# Patient Record
Sex: Female | Born: 1945
Health system: Southern US, Community
[De-identification: ages and names within clinical notes are randomized; demographics above are authoritative.]

## PROBLEM LIST (undated history)

## (undated) DIAGNOSIS — K219 Gastro-esophageal reflux disease without esophagitis: Secondary | ICD-10-CM

## (undated) DIAGNOSIS — I219 Acute myocardial infarction, unspecified: Secondary | ICD-10-CM

## (undated) DIAGNOSIS — G629 Polyneuropathy, unspecified: Secondary | ICD-10-CM

## (undated) DIAGNOSIS — I509 Heart failure, unspecified: Secondary | ICD-10-CM

## (undated) DIAGNOSIS — G4730 Sleep apnea, unspecified: Secondary | ICD-10-CM

## (undated) DIAGNOSIS — E785 Hyperlipidemia, unspecified: Secondary | ICD-10-CM

## (undated) DIAGNOSIS — E119 Type 2 diabetes mellitus without complications: Secondary | ICD-10-CM

## (undated) DIAGNOSIS — M1990 Unspecified osteoarthritis, unspecified site: Secondary | ICD-10-CM

## (undated) DIAGNOSIS — J449 Chronic obstructive pulmonary disease, unspecified: Secondary | ICD-10-CM

## (undated) DIAGNOSIS — I10 Essential (primary) hypertension: Secondary | ICD-10-CM

## (undated) HISTORY — PX: APPENDECTOMY: SHX54

## (undated) HISTORY — PX: CHOLECYSTECTOMY: SHX55

## (undated) HISTORY — PX: ABDOMINAL HYSTERECTOMY: SHX81

## (undated) HISTORY — DX: Polyneuropathy, unspecified: G62.9

## (undated) HISTORY — PX: BREAST EXCISIONAL BIOPSY: SUR124

---

## 1996-12-23 HISTORY — PX: SPINE SURGERY: SHX786

## 2005-06-04 ENCOUNTER — Ambulatory Visit: Payer: Self-pay | Admitting: Obstetrics and Gynecology

## 2006-01-06 ENCOUNTER — Ambulatory Visit: Payer: Self-pay | Admitting: Gastroenterology

## 2006-04-18 ENCOUNTER — Ambulatory Visit: Payer: Self-pay | Admitting: Gastroenterology

## 2006-04-21 ENCOUNTER — Ambulatory Visit: Payer: Self-pay | Admitting: Gastroenterology

## 2006-06-27 ENCOUNTER — Ambulatory Visit: Payer: Self-pay | Admitting: Obstetrics and Gynecology

## 2007-05-14 ENCOUNTER — Ambulatory Visit: Payer: Self-pay | Admitting: Gastroenterology

## 2008-02-17 ENCOUNTER — Emergency Department: Payer: Self-pay | Admitting: Internal Medicine

## 2008-09-04 ENCOUNTER — Other Ambulatory Visit: Payer: Self-pay

## 2008-09-04 ENCOUNTER — Inpatient Hospital Stay: Payer: Self-pay | Admitting: Internal Medicine

## 2008-09-20 ENCOUNTER — Ambulatory Visit: Payer: Self-pay | Admitting: Urology

## 2009-09-18 IMAGING — CT CT CERVICAL SPINE WITHOUT CONTRAST
1 series · 12 of 14 positions shown, 15 images · non-contrast
Comparison: none

REASON FOR EXAM: Fall
COMMENTS:

[Series 6: axial · axial · 0.31mm/px · z∈[+333,+487]mm · 12 of 93 slices shown, 15 images]
[im 8/93  soft-tissue]
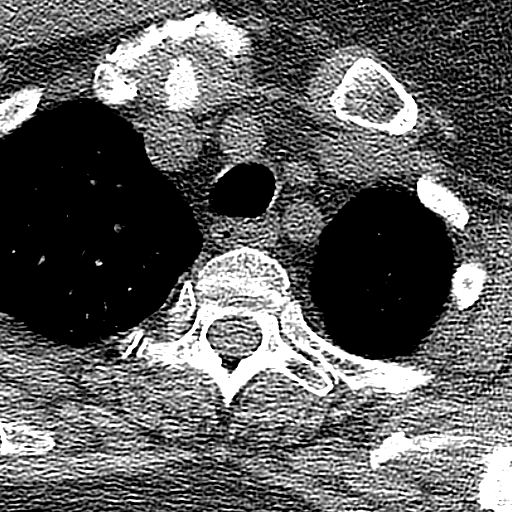
[im 8/93  bone]
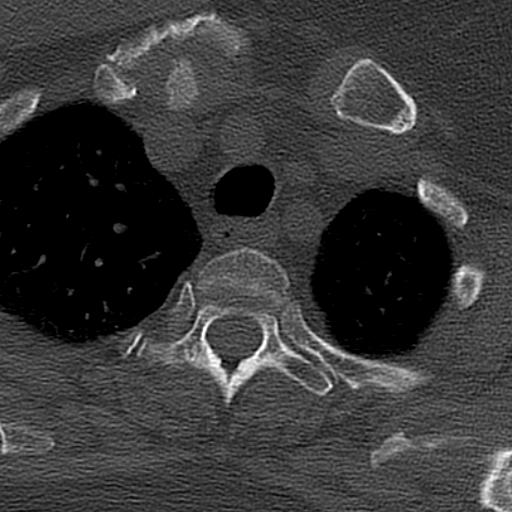
[im 15/93  bone]
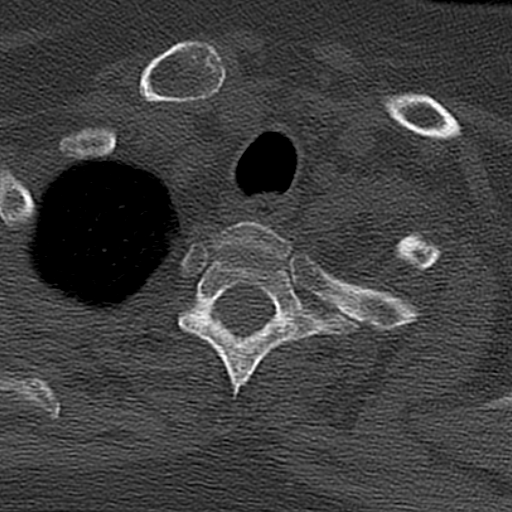
[im 22/93  bone]
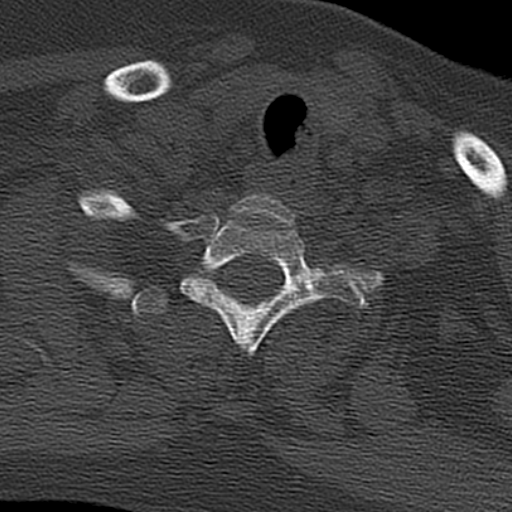
[im 29/93  bone]
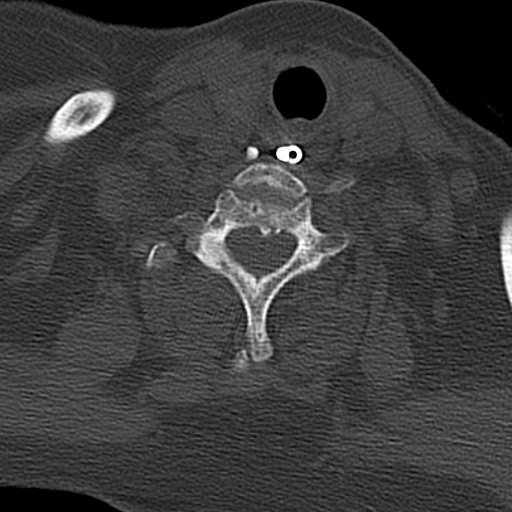
[im 36/93  soft-tissue]
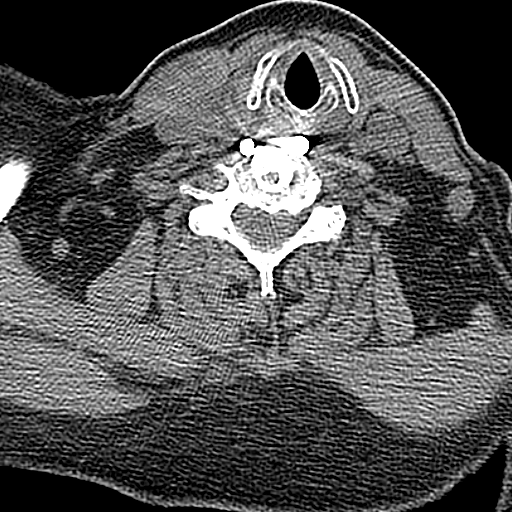
[im 36/93  bone]
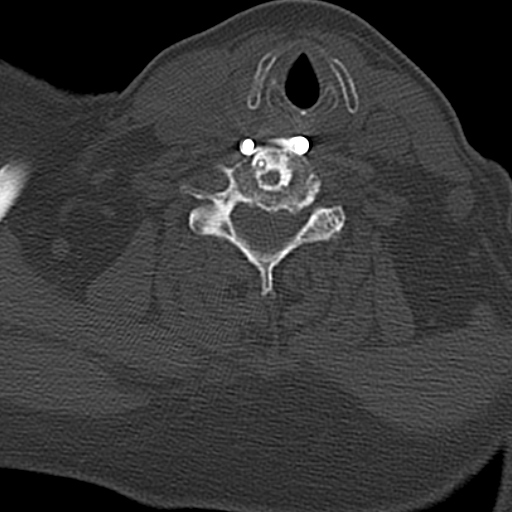
[im 43/93  bone]
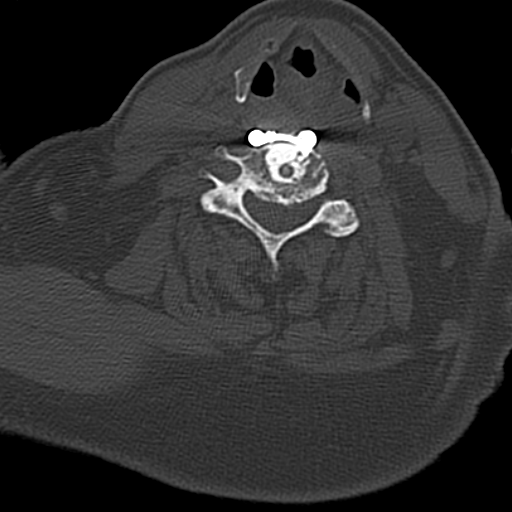
[im 50/93  bone]
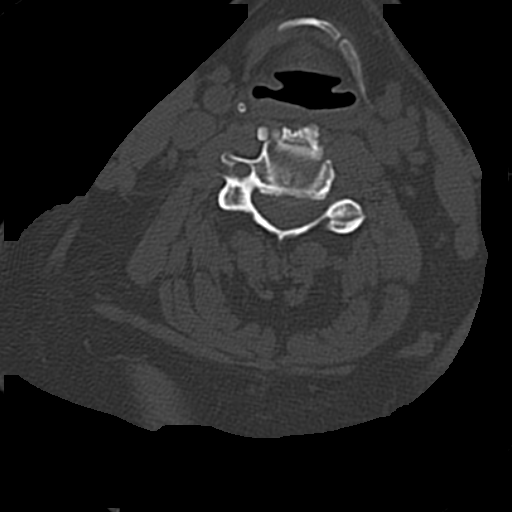
[im 57/93  bone]
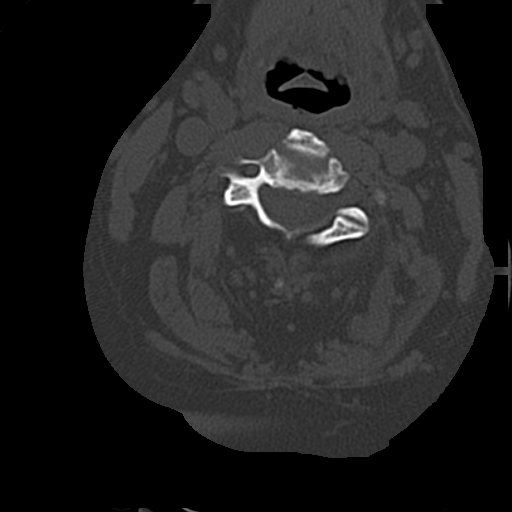
[im 64/93  soft-tissue]
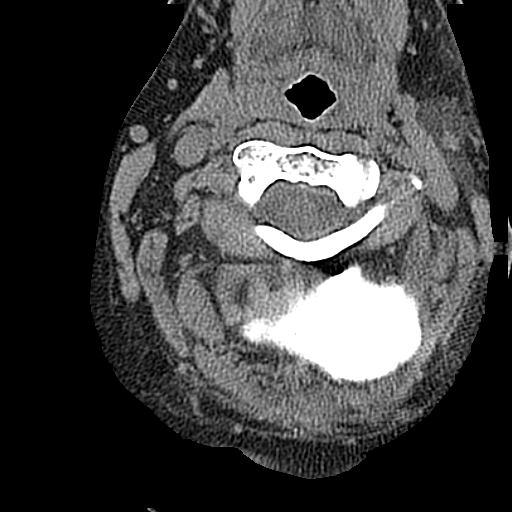
[im 64/93  bone]
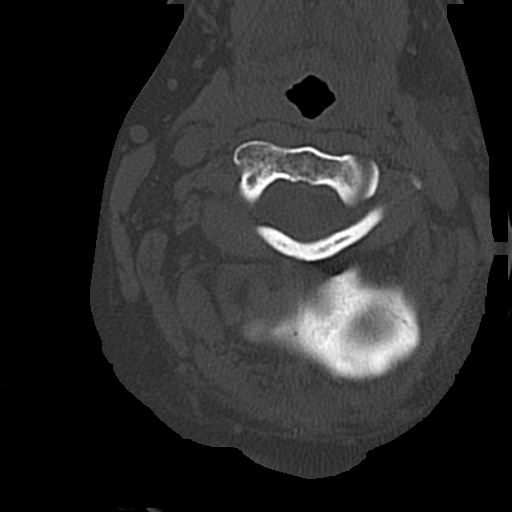
[im 71/93  bone]
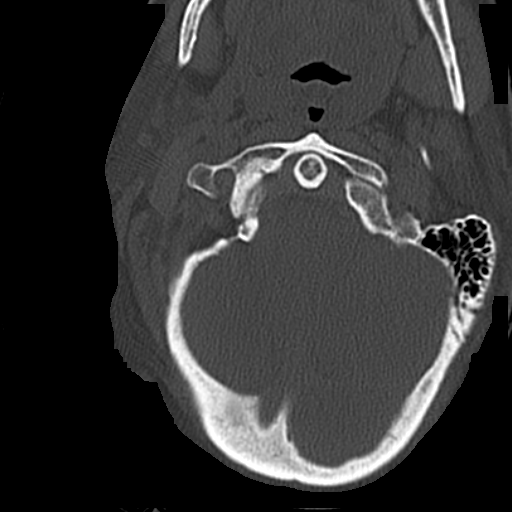
[im 78/93  bone]
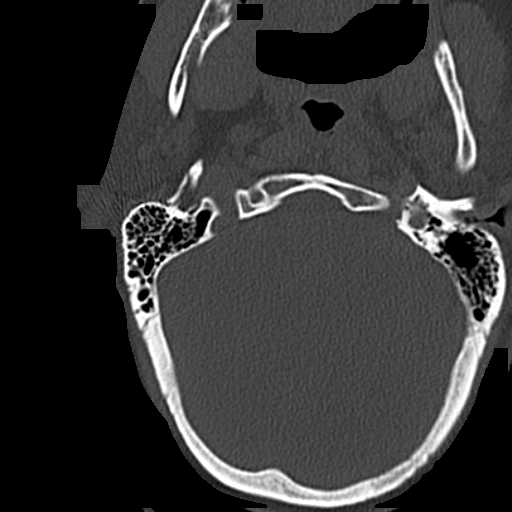
[im 85/93  bone]
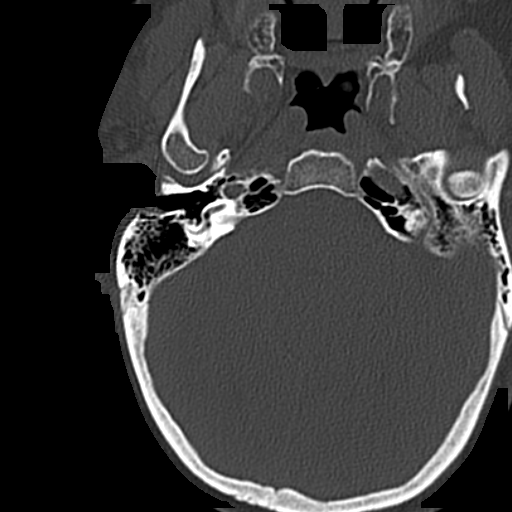

[12 of 14 positions shown; findings below may reference images not displayed]

PROCEDURE:     CT  - CT CERVICAL SPINE WO  - February 17, 2008  [DATE]

RESULT:     The patient is status post plate and screw fixation of the C4,
C5 and C6 vertebral bodies. The hardware appears intact without evidence of
loosening or hardware failure.

Nondisplaced fractures are appreciated along the anterior/inferior end-plate
of C2 and C3. No further fractures or dislocations are appreciated.
IMPRESSION: 1.     Avulsion fractures along the anterior/inferior end-plate of C2 and
C3. These appear to be stable fractures though, considering the patient's
previous fusion of C4 through C6, surgical evaluation is recommended, if
clinically warranted. No further fractures or dislocations are appreciated.
2.     Dr. Felner of the Emergency Room was informed of these findings at
the time of the initial interpretation.

## 2009-09-18 IMAGING — CR DG SHOULDER 3+V*R*
1 series · 4 of 4 positions shown · non-contrast
Comparison: none

REASON FOR EXAM: fall pain
COMMENTS:

PROCEDURE:     DXR - DXR SHOULDER RIGHT COMPLETE  - February 17, 2008  [DATE]
RESULT:     Images of the RIGHT shoulder demonstrate no definite fracture,
dislocation or radiopaque foreign body. The patient appears to be on a
backboard.

[Series 1: view not recorded · 0.17mm/px · 4 of 4 slices shown]
[im 1/4]
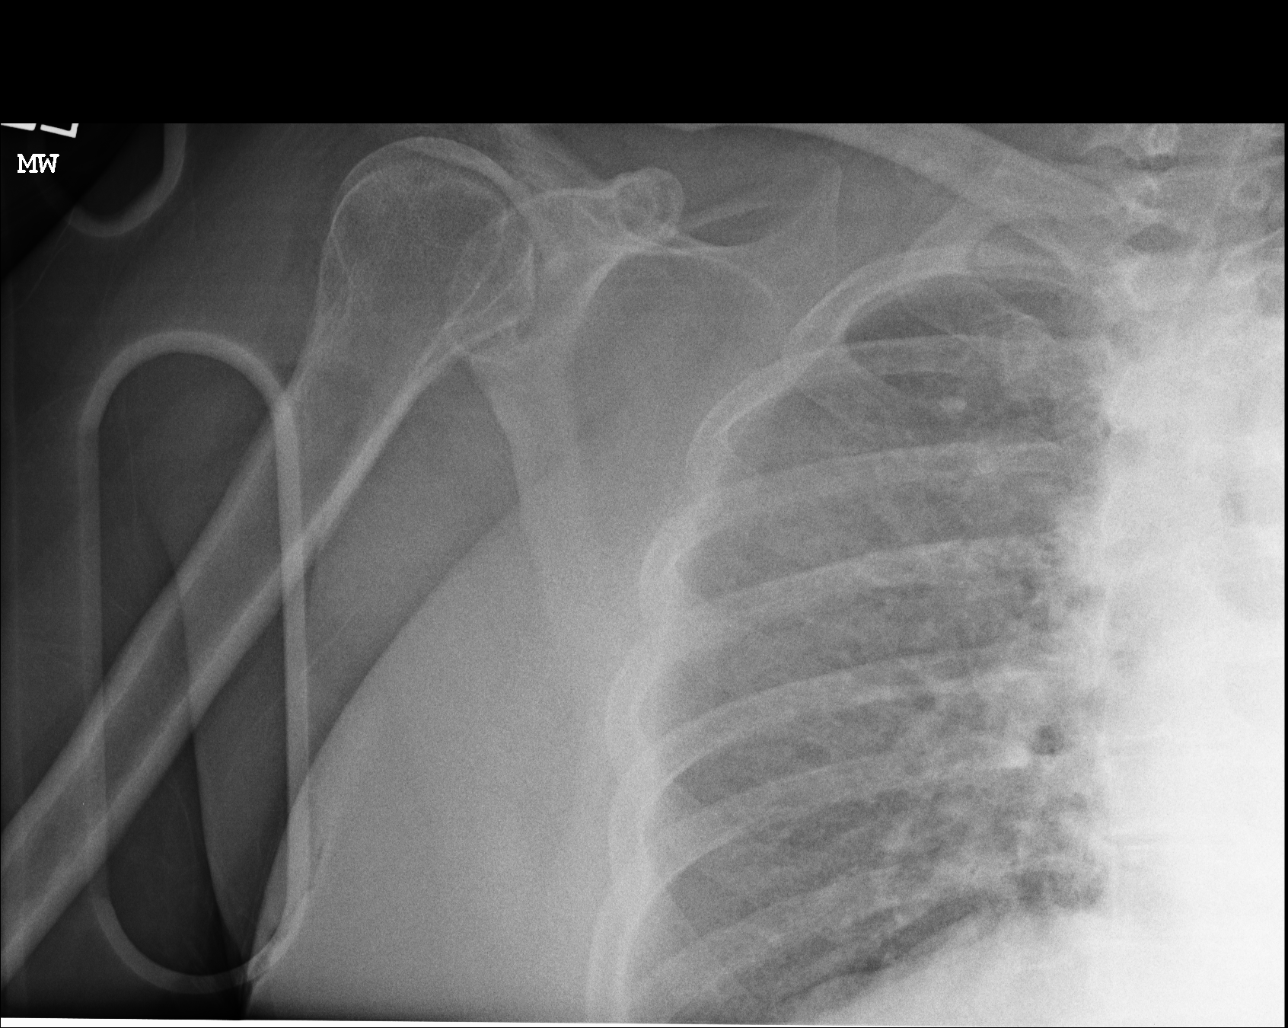
[im 2/4]
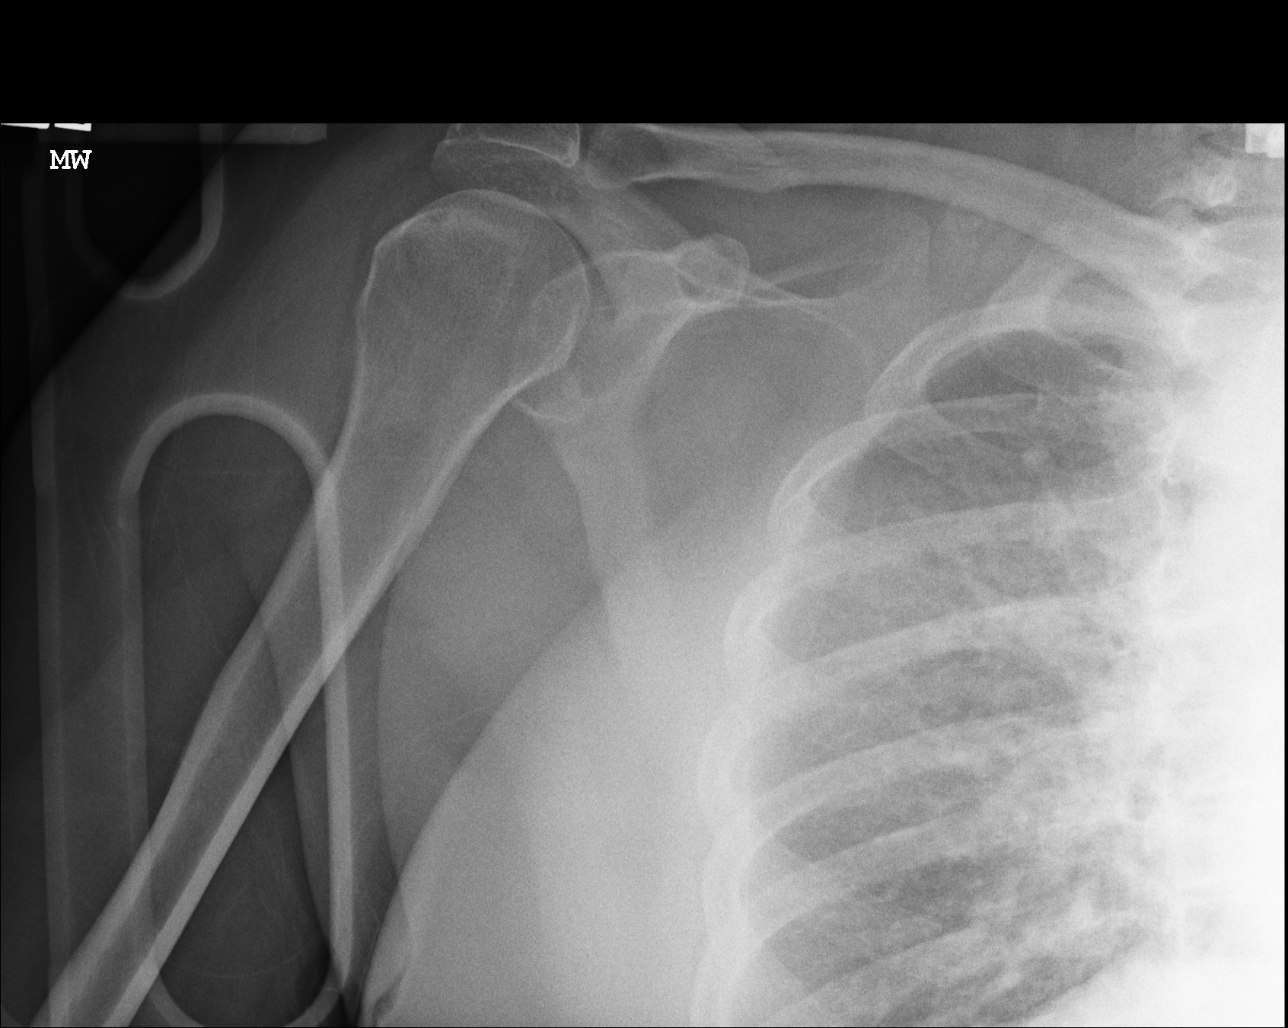
[im 3/4]
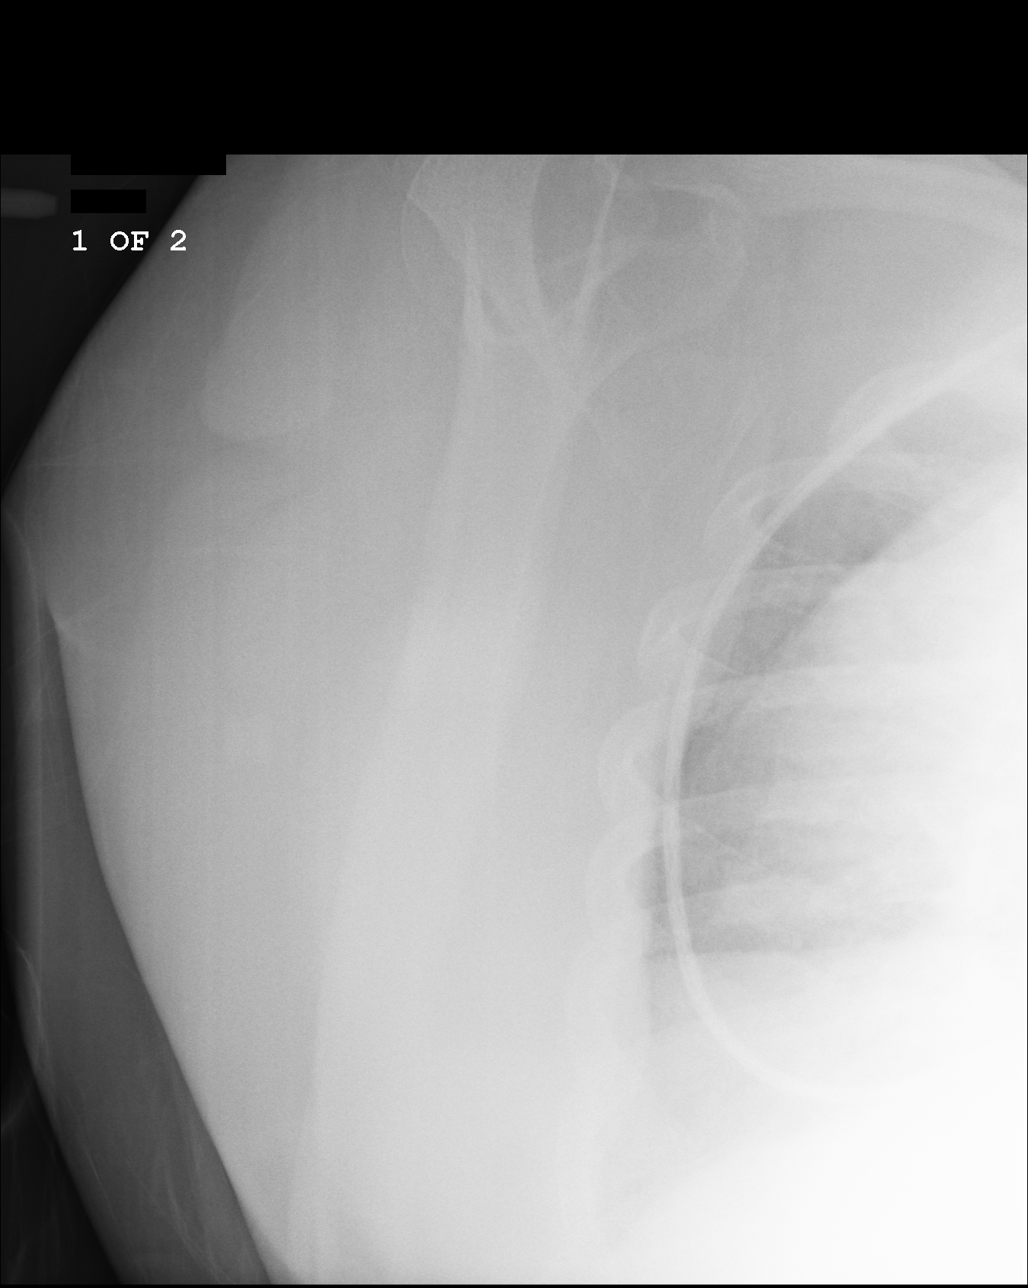
[im 4/4]
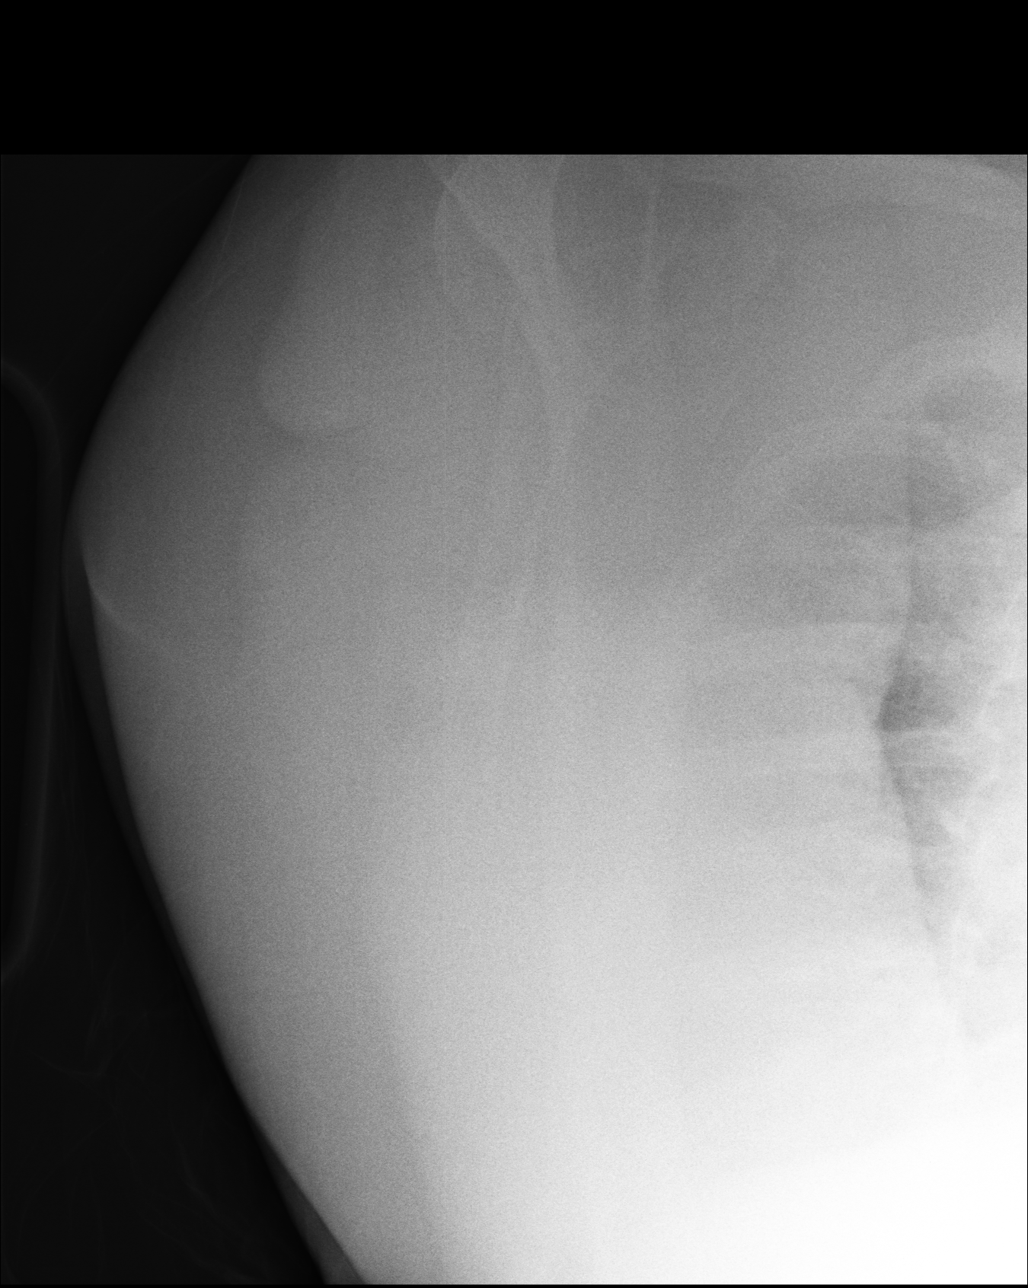

[4 of 4 positions shown; findings below may reference images not displayed]

IMPRESSION: No acute bony abnormality demonstrated.

## 2011-07-31 ENCOUNTER — Ambulatory Visit: Payer: Self-pay | Admitting: Family Medicine

## 2011-08-12 ENCOUNTER — Ambulatory Visit: Payer: Self-pay | Admitting: Family Medicine

## 2012-10-22 ENCOUNTER — Ambulatory Visit: Payer: Self-pay | Admitting: Gastroenterology

## 2012-10-24 ENCOUNTER — Emergency Department: Payer: Self-pay | Admitting: Emergency Medicine

## 2014-09-27 ENCOUNTER — Ambulatory Visit: Payer: Self-pay | Admitting: Family Medicine

## 2015-07-04 ENCOUNTER — Other Ambulatory Visit: Payer: Self-pay | Admitting: Family Medicine

## 2015-07-04 ENCOUNTER — Ambulatory Visit
Admission: RE | Admit: 2015-07-04 | Discharge: 2015-07-04 | Disposition: A | Discharge status: Home / Self Care | Payer: PPO | Source: Ambulatory Visit | Attending: Family Medicine | Admitting: Family Medicine

## 2015-07-04 DIAGNOSIS — M1710 Unilateral primary osteoarthritis, unspecified knee: Secondary | ICD-10-CM

## 2015-07-04 DIAGNOSIS — M1712 Unilateral primary osteoarthritis, left knee: Secondary | ICD-10-CM | POA: Insufficient documentation

## 2015-07-04 DIAGNOSIS — M25462 Effusion, left knee: Secondary | ICD-10-CM | POA: Insufficient documentation

## 2016-03-27 DIAGNOSIS — Z1389 Encounter for screening for other disorder: Secondary | ICD-10-CM | POA: Diagnosis not present

## 2016-03-27 DIAGNOSIS — F411 Generalized anxiety disorder: Secondary | ICD-10-CM | POA: Diagnosis not present

## 2016-03-27 DIAGNOSIS — K219 Gastro-esophageal reflux disease without esophagitis: Secondary | ICD-10-CM | POA: Diagnosis not present

## 2016-03-27 DIAGNOSIS — Z0001 Encounter for general adult medical examination with abnormal findings: Secondary | ICD-10-CM | POA: Diagnosis not present

## 2016-03-27 DIAGNOSIS — E782 Mixed hyperlipidemia: Secondary | ICD-10-CM | POA: Diagnosis not present

## 2016-03-27 DIAGNOSIS — I10 Essential (primary) hypertension: Secondary | ICD-10-CM | POA: Diagnosis not present

## 2016-03-27 DIAGNOSIS — E1140 Type 2 diabetes mellitus with diabetic neuropathy, unspecified: Secondary | ICD-10-CM | POA: Diagnosis not present

## 2016-06-20 DIAGNOSIS — E1140 Type 2 diabetes mellitus with diabetic neuropathy, unspecified: Secondary | ICD-10-CM | POA: Diagnosis not present

## 2016-10-28 DIAGNOSIS — J206 Acute bronchitis due to rhinovirus: Secondary | ICD-10-CM | POA: Diagnosis not present

## 2016-10-28 DIAGNOSIS — Z6834 Body mass index (BMI) 34.0-34.9, adult: Secondary | ICD-10-CM | POA: Diagnosis not present

## 2016-12-04 DIAGNOSIS — Z6833 Body mass index (BMI) 33.0-33.9, adult: Secondary | ICD-10-CM | POA: Diagnosis not present

## 2016-12-04 DIAGNOSIS — E1140 Type 2 diabetes mellitus with diabetic neuropathy, unspecified: Secondary | ICD-10-CM | POA: Diagnosis not present

## 2016-12-04 DIAGNOSIS — I10 Essential (primary) hypertension: Secondary | ICD-10-CM | POA: Diagnosis not present

## 2017-01-27 DIAGNOSIS — Z6835 Body mass index (BMI) 35.0-35.9, adult: Secondary | ICD-10-CM | POA: Diagnosis not present

## 2017-01-27 DIAGNOSIS — E1140 Type 2 diabetes mellitus with diabetic neuropathy, unspecified: Secondary | ICD-10-CM | POA: Diagnosis not present

## 2017-01-27 DIAGNOSIS — I10 Essential (primary) hypertension: Secondary | ICD-10-CM | POA: Diagnosis not present

## 2017-01-27 DIAGNOSIS — N3090 Cystitis, unspecified without hematuria: Secondary | ICD-10-CM | POA: Diagnosis not present

## 2017-07-01 DIAGNOSIS — K219 Gastro-esophageal reflux disease without esophagitis: Secondary | ICD-10-CM | POA: Diagnosis not present

## 2017-07-01 DIAGNOSIS — I10 Essential (primary) hypertension: Secondary | ICD-10-CM | POA: Diagnosis not present

## 2017-07-01 DIAGNOSIS — E782 Mixed hyperlipidemia: Secondary | ICD-10-CM | POA: Diagnosis not present

## 2017-07-01 DIAGNOSIS — F411 Generalized anxiety disorder: Secondary | ICD-10-CM | POA: Diagnosis not present

## 2017-07-01 DIAGNOSIS — E1140 Type 2 diabetes mellitus with diabetic neuropathy, unspecified: Secondary | ICD-10-CM | POA: Diagnosis not present

## 2017-07-01 DIAGNOSIS — Z6835 Body mass index (BMI) 35.0-35.9, adult: Secondary | ICD-10-CM | POA: Diagnosis not present

## 2017-07-07 ENCOUNTER — Emergency Department
Admission: EM | Admit: 2017-07-07 | Discharge: 2017-07-08 | Disposition: A | Discharge status: Home / Self Care | Payer: PPO | Attending: Emergency Medicine | Admitting: Emergency Medicine

## 2017-07-07 ENCOUNTER — Encounter: Payer: Self-pay | Admitting: Emergency Medicine

## 2017-07-07 DIAGNOSIS — E162 Hypoglycemia, unspecified: Secondary | ICD-10-CM | POA: Diagnosis not present

## 2017-07-07 DIAGNOSIS — Z87891 Personal history of nicotine dependence: Secondary | ICD-10-CM | POA: Diagnosis not present

## 2017-07-07 DIAGNOSIS — E11649 Type 2 diabetes mellitus with hypoglycemia without coma: Secondary | ICD-10-CM | POA: Insufficient documentation

## 2017-07-07 DIAGNOSIS — I10 Essential (primary) hypertension: Secondary | ICD-10-CM | POA: Diagnosis not present

## 2017-07-07 DIAGNOSIS — R4020 Unspecified coma: Secondary | ICD-10-CM | POA: Diagnosis not present

## 2017-07-07 HISTORY — DX: Type 2 diabetes mellitus without complications: E11.9

## 2017-07-07 HISTORY — DX: Essential (primary) hypertension: I10

## 2017-07-07 HISTORY — DX: Hyperlipidemia, unspecified: E78.5

## 2017-07-07 LAB — CBC WITH DIFFERENTIAL/PLATELET
BASOS ABS: 0.1 10*3/uL (ref 0–0.1)
BASOS PCT: 1 %
EOS PCT: 3 %
Eosinophils Absolute: 0.3 10*3/uL (ref 0–0.7)
HCT: 33.2 % — ABNORMAL LOW (ref 35.0–47.0)
Hemoglobin: 11.1 g/dL — ABNORMAL LOW (ref 12.0–16.0)
Lymphocytes Relative: 31 %
Lymphs Abs: 2.6 10*3/uL (ref 1.0–3.6)
MCH: 31.1 pg (ref 26.0–34.0)
MCHC: 33.3 g/dL (ref 32.0–36.0)
MCV: 93.3 fL (ref 80.0–100.0)
MONO ABS: 0.6 10*3/uL (ref 0.2–0.9)
Monocytes Relative: 7 %
NEUTROS ABS: 4.9 10*3/uL (ref 1.4–6.5)
Neutrophils Relative %: 58 %
PLATELETS: 194 10*3/uL (ref 150–440)
RBC: 3.56 MIL/uL — ABNORMAL LOW (ref 3.80–5.20)
RDW: 15.4 % — AB (ref 11.5–14.5)
WBC: 8.3 10*3/uL (ref 3.6–11.0)

## 2017-07-07 LAB — BASIC METABOLIC PANEL
ANION GAP: 8 (ref 5–15)
BUN: 15 mg/dL (ref 6–20)
CALCIUM: 8.4 mg/dL — AB (ref 8.9–10.3)
CO2: 24 mmol/L (ref 22–32)
Chloride: 110 mmol/L (ref 101–111)
Creatinine, Ser: 1.19 mg/dL — ABNORMAL HIGH (ref 0.44–1.00)
GFR, EST AFRICAN AMERICAN: 52 mL/min — AB (ref >60)
GFR, EST NON AFRICAN AMERICAN: 45 mL/min — AB (ref >60)
Glucose, Bld: 140 mg/dL — ABNORMAL HIGH (ref 65–99)
Potassium: 3.3 mmol/L — ABNORMAL LOW (ref 3.5–5.1)
Sodium: 142 mmol/L (ref 135–145)

## 2017-07-07 LAB — GLUCOSE, CAPILLARY
GLUCOSE-CAPILLARY: 101 mg/dL — AB (ref 65–99)
GLUCOSE-CAPILLARY: 111 mg/dL — AB (ref 65–99)
GLUCOSE-CAPILLARY: 96 mg/dL (ref 65–99)
Glucose-Capillary: 133 mg/dL — ABNORMAL HIGH (ref 65–99)
Glucose-Capillary: 136 mg/dL — ABNORMAL HIGH (ref 65–99)
Glucose-Capillary: 144 mg/dL — ABNORMAL HIGH (ref 65–99)
Glucose-Capillary: 229 mg/dL — ABNORMAL HIGH (ref 65–99)

## 2017-07-07 MED ORDER — DEXTROSE 10 % IV SOLN
INTRAVENOUS | Status: DC
  Administered 2017-07-07: 22:00:00 via INTRAVENOUS

## 2017-07-07 NOTE — ED Triage Notes (Addendum)
Pt found unresponsive on kitchen floor with low CBG of 20. Pt given herself a normal amount of insulin on 40 units and has not been eating. Given 1 amp of D50 and checked sugar of 240. Then checked again in 70 and given another half of amp of D50 and 4mg  of zofran. Pt states her right leg hurting. Skin tear on right forearm.

## 2017-07-07 NOTE — ED Provider Notes (Signed)
[facility] Provider Note   ____________________________________________   I have reviewed the triage vital signs and the nursing notes.   HISTORY  Chief Complaint Hypoglycemia   History limited by: Not Limited   HPI [facility] is a 71 y.o. female who presents to the emergency department today because of an episode of unresponsiveness and hypoglycemia. The patient states that her blood sugars have been running in the 80s today she just started a new medication today and has not had much of an appetite. She states normally her sugars run in the 100s. She did however take her normal 40 units of insulin tonight. Shortly afterwards family found her on the floor of her kitchen unresponsive. Her blood sugars at that time were in the 20s. EMS gave a total of 1-1/2 A of D50 during transport. Patient denies any chest pain or shortness of breath. She denies any recent illnesses or fevers.   Past Medical History:  Diagnosis Date  . Diabetes mellitus without complication ([place])   . Hyperlipemia   . Hypertension     There are no active problems to display for this patient.   No past surgical history on file.  Prior to Admission medications   Not on File    Allergies Patient has no known allergies.  No family history on file.  Social History Social History  Substance Use Topics  . Smoking status: Former Research scientist (life sciences)  . Smokeless tobacco: Not on file  . Alcohol use Yes     Comment: occasional    Review of Systems Constitutional: No fever/chills Eyes: No visual changes. ENT: No sore throat. Cardiovascular: Denies chest pain. Respiratory: Denies shortness of breath. Gastrointestinal: No abdominal pain.  No nausea, no vomiting.  No diarrhea.   Genitourinary: Negative for dysuria. Musculoskeletal: Negative for back pain. Skin: Negative for rash. Neurological: Negative for headaches, focal weakness or  numbness.  ____________________________________________   PHYSICAL EXAM:  VITAL SIGNS: ED Triage Vitals [07/07/17 2113]  Enc Vitals Group     BP 132/73     Pulse Rate 78     Resp 18     Temp      Temp src      SpO2 92 %     Weight 190 lb (86.2 kg)     Height 5\' 1"  (1.549 m)   Constitutional: Alert and oriented. Well appearing and in no distress. Eyes: Conjunctivae are normal.  ENT   Head: Normocephalic and atraumatic.   Nose: No congestion/rhinnorhea.   Mouth/Throat: Mucous membranes are moist.   Neck: No stridor. Hematological/Lymphatic/Immunilogical: No cervical lymphadenopathy. Cardiovascular: Normal rate, regular rhythm.  No murmurs, rubs, or gallops.  Respiratory: Normal respiratory effort without tachypnea nor retractions. Breath sounds are clear and equal bilaterally. No wheezes/rales/rhonchi. Gastrointestinal: Soft and non tender. No rebound. No guarding.  Genitourinary: Deferred Musculoskeletal: Normal range of motion in all extremities. No lower extremity edema. Neurologic:  Normal speech and language. No gross focal neurologic deficits are appreciated.  Skin:  Skin is warm, dry and intact. No rash noted. Psychiatric: Mood and affect are normal. Speech and behavior are normal. Patient exhibits appropriate insight and judgment.  ____________________________________________    LABS (pertinent positives/negatives)  Labs Reviewed  GLUCOSE, CAPILLARY - Abnormal; Notable for the following:       Result Value   Glucose-Capillary 133 (*)    All other components within normal limits  CBC WITH DIFFERENTIAL/PLATELET - Abnormal; Notable for the following:    RBC 3.56 (*)  Hemoglobin 11.1 (*)    HCT 33.2 (*)    RDW 15.4 (*)    All other components within normal limits  BASIC METABOLIC PANEL - Abnormal; Notable for the following:    Potassium 3.3 (*)    Glucose, Bld 140 (*)    Creatinine, Ser 1.19 (*)    Calcium 8.4 (*)    GFR calc non Af Amer 45 (*)     GFR calc Af Amer 52 (*)    All other components within normal limits  GLUCOSE, CAPILLARY - Abnormal; Notable for the following:    Glucose-Capillary 101 (*)    All other components within normal limits  GLUCOSE, CAPILLARY - Abnormal; Notable for the following:    Glucose-Capillary 111 (*)    All other components within normal limits  GLUCOSE, CAPILLARY - Abnormal; Notable for the following:    Glucose-Capillary 136 (*)    All other components within normal limits  GLUCOSE, CAPILLARY     ____________________________________________   EKG  I, [facility], attending physician, personally viewed and interpreted this EKG  EKG Time: 2122 Rate: 79 Rhythm: sinus rhythm Axis: normal Intervals: qtc 412 QRS: narrow ST changes: no st elevation Impression: normal ekg   ____________________________________________    RADIOLOGY  None  ____________________________________________   PROCEDURES  Procedures  ____________________________________________   INITIAL IMPRESSION / ASSESSMENT AND PLAN / ED COURSE  Pertinent labs & imaging results that were available during my care of the patient were reviewed by me and considered in my medical decision making (see chart for details).  Patient presented to the emergency department today because of concerns for unresponsiveness and decreased sugars. Patient sugars were low for EMS. Here in the emergency department it did start on T10 however sugars seemed to remain high. D10 was stopped and we will continue to take sugars for a few hours. I think if they do stabilize she would be safe for discharge home.  ____________________________________________   FINAL CLINICAL IMPRESSION(S) / ED DIAGNOSES  Final diagnoses:  Hypoglycemia     Note: This dictation was prepared with Dragon dictation. Any transcriptional errors that result from this process are unintentional     [facility] Pear, MD 07/07/17 [id number]

## 2017-07-07 NOTE — ED Notes (Signed)
Pt

## 2017-07-07 NOTE — ED Notes (Signed)
Pt placed on 2L [place] because patient had O2 sat in high 80's low 90's and 99% on 2L

## 2017-07-07 NOTE — Discharge Instructions (Signed)
Please seek medical attention for any high fevers, chest pain, shortness of breath, change in behavior, persistent vomiting, bloody stool or any other new or concerning symptoms.  

## 2017-07-08 LAB — GLUCOSE, CAPILLARY
Glucose-Capillary: 235 mg/dL — ABNORMAL HIGH (ref 65–99)
Glucose-Capillary: 217 mg/dL — ABNORMAL HIGH (ref 65–99)

## 2017-07-11 DIAGNOSIS — M47817 Spondylosis without myelopathy or radiculopathy, lumbosacral region: Secondary | ICD-10-CM | POA: Diagnosis not present

## 2017-07-11 DIAGNOSIS — M5417 Radiculopathy, lumbosacral region: Secondary | ICD-10-CM | POA: Diagnosis not present

## 2017-07-11 DIAGNOSIS — M1288 Other specific arthropathies, not elsewhere classified, other specified site: Secondary | ICD-10-CM | POA: Diagnosis not present

## 2017-07-11 DIAGNOSIS — M5137 Other intervertebral disc degeneration, lumbosacral region: Secondary | ICD-10-CM | POA: Diagnosis not present

## 2017-07-22 ENCOUNTER — Other Ambulatory Visit: Payer: Self-pay | Admitting: Neurological Surgery

## 2017-07-22 DIAGNOSIS — M5417 Radiculopathy, lumbosacral region: Secondary | ICD-10-CM | POA: Diagnosis not present

## 2017-07-22 DIAGNOSIS — M5416 Radiculopathy, lumbar region: Secondary | ICD-10-CM

## 2017-07-23 DIAGNOSIS — E11649 Type 2 diabetes mellitus with hypoglycemia without coma: Secondary | ICD-10-CM | POA: Diagnosis not present

## 2017-07-23 DIAGNOSIS — M5441 Lumbago with sciatica, right side: Secondary | ICD-10-CM | POA: Diagnosis not present

## 2017-07-25 ENCOUNTER — Ambulatory Visit
Admission: RE | Admit: 2017-07-25 | Discharge: 2017-07-25 | Disposition: A | Discharge status: Home / Self Care | Payer: PPO | Source: Ambulatory Visit | Attending: Neurological Surgery | Admitting: Neurological Surgery

## 2017-07-25 DIAGNOSIS — M5126 Other intervertebral disc displacement, lumbar region: Secondary | ICD-10-CM | POA: Insufficient documentation

## 2017-07-25 DIAGNOSIS — M545 Low back pain: Secondary | ICD-10-CM | POA: Diagnosis not present

## 2017-07-25 DIAGNOSIS — M47896 Other spondylosis, lumbar region: Secondary | ICD-10-CM | POA: Insufficient documentation

## 2017-07-25 DIAGNOSIS — M5416 Radiculopathy, lumbar region: Secondary | ICD-10-CM | POA: Diagnosis not present

## 2017-08-05 ENCOUNTER — Ambulatory Visit
Payer: PPO | Attending: Student in an Organized Health Care Education/Training Program | Admitting: Student in an Organized Health Care Education/Training Program

## 2017-08-05 ENCOUNTER — Ambulatory Visit
Admission: RE | Admit: 2017-08-05 | Discharge: 2017-08-05 | Disposition: A | Discharge status: Home / Self Care | Payer: PPO | Source: Ambulatory Visit | Attending: Student in an Organized Health Care Education/Training Program | Admitting: Student in an Organized Health Care Education/Training Program

## 2017-08-05 ENCOUNTER — Encounter: Payer: Self-pay | Admitting: Student in an Organized Health Care Education/Training Program

## 2017-08-05 VITALS — BP 139/87 | HR 103 | Resp 18 | Ht 61.0 in | Wt 188.4 lb

## 2017-08-05 DIAGNOSIS — E785 Hyperlipidemia, unspecified: Secondary | ICD-10-CM | POA: Diagnosis not present

## 2017-08-05 DIAGNOSIS — Z9049 Acquired absence of other specified parts of digestive tract: Secondary | ICD-10-CM | POA: Insufficient documentation

## 2017-08-05 DIAGNOSIS — Z981 Arthrodesis status: Secondary | ICD-10-CM | POA: Insufficient documentation

## 2017-08-05 DIAGNOSIS — Z794 Long term (current) use of insulin: Secondary | ICD-10-CM | POA: Diagnosis not present

## 2017-08-05 DIAGNOSIS — E1140 Type 2 diabetes mellitus with diabetic neuropathy, unspecified: Secondary | ICD-10-CM | POA: Diagnosis not present

## 2017-08-05 DIAGNOSIS — Z87442 Personal history of urinary calculi: Secondary | ICD-10-CM | POA: Diagnosis not present

## 2017-08-05 DIAGNOSIS — Z87891 Personal history of nicotine dependence: Secondary | ICD-10-CM | POA: Diagnosis not present

## 2017-08-05 DIAGNOSIS — G629 Polyneuropathy, unspecified: Secondary | ICD-10-CM | POA: Insufficient documentation

## 2017-08-05 DIAGNOSIS — Z90710 Acquired absence of both cervix and uterus: Secondary | ICD-10-CM | POA: Insufficient documentation

## 2017-08-05 DIAGNOSIS — M79604 Pain in right leg: Secondary | ICD-10-CM | POA: Insufficient documentation

## 2017-08-05 DIAGNOSIS — M5416 Radiculopathy, lumbar region: Secondary | ICD-10-CM | POA: Insufficient documentation

## 2017-08-05 DIAGNOSIS — M79661 Pain in right lower leg: Secondary | ICD-10-CM | POA: Diagnosis not present

## 2017-08-05 DIAGNOSIS — Z8249 Family history of ischemic heart disease and other diseases of the circulatory system: Secondary | ICD-10-CM | POA: Insufficient documentation

## 2017-08-05 DIAGNOSIS — Z833 Family history of diabetes mellitus: Secondary | ICD-10-CM | POA: Diagnosis not present

## 2017-08-05 DIAGNOSIS — M47816 Spondylosis without myelopathy or radiculopathy, lumbar region: Secondary | ICD-10-CM | POA: Diagnosis not present

## 2017-08-05 DIAGNOSIS — N189 Chronic kidney disease, unspecified: Secondary | ICD-10-CM | POA: Diagnosis not present

## 2017-08-05 DIAGNOSIS — E1122 Type 2 diabetes mellitus with diabetic chronic kidney disease: Secondary | ICD-10-CM | POA: Diagnosis not present

## 2017-08-05 DIAGNOSIS — M5136 Other intervertebral disc degeneration, lumbar region: Secondary | ICD-10-CM | POA: Insufficient documentation

## 2017-08-05 DIAGNOSIS — I129 Hypertensive chronic kidney disease with stage 1 through stage 4 chronic kidney disease, or unspecified chronic kidney disease: Secondary | ICD-10-CM | POA: Insufficient documentation

## 2017-08-05 DIAGNOSIS — Z880 Allergy status to penicillin: Secondary | ICD-10-CM | POA: Insufficient documentation

## 2017-08-05 DIAGNOSIS — K219 Gastro-esophageal reflux disease without esophagitis: Secondary | ICD-10-CM | POA: Diagnosis not present

## 2017-08-05 DIAGNOSIS — Z809 Family history of malignant neoplasm, unspecified: Secondary | ICD-10-CM | POA: Insufficient documentation

## 2017-08-05 NOTE — Progress Notes (Deleted)
Patient's Name: Kelsey Mcdowell  MRN: [id number]  Referring Provider: Marin Olp, Hershal Coria  DOB: 15-Mar-1946  PCP: Lynnell Jude, MD  DOS: 08/05/2017  Note by: Gillis Santa, MD  Service setting: Ambulatory outpatient  Specialty: Interventional Pain Management  Location: [facility] (AMB) Pain Management Facility  Visit type: Initial Patient Evaluation  Patient type: New Patient   Primary Reason(s) for Visit: Encounter for initial evaluation of one or more chronic problems (new to examiner) potentially causing chronic pain, and posing a threat to normal musculoskeletal function. (Level of risk: High) CC: Back Pain (low); Leg Pain (right); and Hip Pain (right)  HPI  Kelsey Mcdowell is a 71 y.o. year old, female patient, who comes today to see [place] for the first time for an initial evaluation of her chronic pain. She has Lumbar spondylosis; Lumbar degenerative disc disease; Lumbar radiculopathy; S/P cervical spinal fusion; Neuropathy; and Right leg pain on her problem list. Today she comes in for evaluation of her Back Pain (low); Leg Pain (right); and Hip Pain (right)  Pain Assessment: Location: Lower Back Radiating: right hip and leg Onset: 1 to 4 weeks ago Duration: Acute pain Quality: Nagging, Aching, Dull Severity: 5 /10 (self-reported pain score)  Note: Reported level is compatible with observation.                   Effect on ADL:   Timing: Constant Modifying factors: sitting, medications,heat, hot showers  Onset and Duration: Sudden and Present less than 3 months Cause of pain: passed out and fell Severity: Getting better, NAS-11 at its worse: 10/10, NAS-11 at its best: 6/10, NAS-11 now: 4/10 and NAS-11 on the average: 4/10 Timing: Morning and Afternoon Aggravating Factors: Prolonged sitting and Walking Alleviating Factors: Medications Associated Problems: Weakness Quality of Pain: Agonizing, Burning, Nagging and Sharp Previous Examinations or Tests: MRI scan Previous Treatments:  Steroid treatments by mouth  The patient comes into the clinics today for the first time for a chronic pain management evaluation.   Today I took the time to provide the patient with information regarding my pain practice. The patient was informed that my practice is divided into two sections: an interventional pain management section, as well as a completely separate and distinct medication management section. I explained that I have procedure days for my interventional therapies, and evaluation days for follow-ups and medication management. Because of the amount of documentation required during both, they are kept separated. This means that there is the possibility that she may be scheduled for a procedure on one day, and medication management the next. I have also informed her that because of staffing and facility limitations, I no longer take patients for medication management only. To illustrate the reasons for this, I gave the patient the example of surgeons, and how inappropriate it would be to refer a patient to his/her care, just to write for the post-surgical antibiotics on a surgery done by a different surgeon.   Because interventional pain management is my board-certified specialty, the patient was informed that joining my practice means that they are open to any and all interventional therapies. I made it clear that this does not mean that they will be forced to have any procedures done. What this means is that I believe interventional therapies to be essential part of the diagnosis and proper management of chronic pain conditions. Therefore, patients not interested in these interventional alternatives will be better served under the care of a different practitioner.  The patient was also  made aware of my Comprehensive Pain Management Safety Guidelines where by joining my practice, they limit all of their nerve blocks and joint injections to those done by our practice, for as long as we are  retained to manage their care.   Historic Controlled Substance Pharmacotherapy Review  PMP and historical list of controlled substances: ***  Highest opioid analgesic regimen found: ***  Most recent opioid analgesic: ***  Current opioid analgesics: ***  Highest recorded MME/day: *** mg/day MME/day: *** mg/day Medications: The patient did not bring the medication(s) to the appointment, as requested in our "New Patient Package" Pharmacodynamics: Desired effects: Analgesia: The patient reports >50% benefit. Reported improvement in function: The patient reports medication allows her to accomplish basic ADLs. Clinically meaningful improvement in function (CMIF): Sustained CMIF goals met Perceived effectiveness: Described as relatively effective, allowing for increase in activities of daily living (ADL) Undesirable effects: Side-effects or Adverse reactions: None reported Historical Monitoring: The patient  reports that she does not use drugs. List of all UDS Test(s): No results found for: MDMA, COCAINSCRNUR, PCPSCRNUR, PCPQUANT, CANNABQUANT, THCU, [place] List of all Serum Drug Screening Test(s):  No results found for: AMPHSCRSER, BARBSCRSER, BENZOSCRSER, COCAINSCRSER, PCPSCRSER, PCPQUANT, THCSCRSER, CANNABQUANT, OPIATESCRSER, OXYSCRSER, PROPOXSCRSER Historical Background Evaluation: [place] PDMP: Six (6) year initial data search conducted.             [place] Department of public safety, offender search: Editor, commissioning Information) Non-contributory Risk Assessment Profile: Aberrant behavior: None observed or detected today Risk factors for fatal opioid overdose: None identified today Fatal overdose hazard ratio (HR): Calculation deferred Non-fatal overdose hazard ratio (HR): Calculation deferred Risk of opioid abuse or dependence: 0.7-3.0% with doses ? 36 MME/day and 6.1-26% with doses ? 120 MME/day. Substance use disorder (SUD) risk level: Pending results of Medical Psychology Evaluation for SUD Opioid  risk tool (ORT) (Total Score): 0     Opioid Risk Tool - 08/05/17 1202      Family History of Substance Abuse   Alcohol Negative   Illegal Drugs Negative   Rx Drugs Negative     Personal History of Substance Abuse   Alcohol Negative   Illegal Drugs Negative   Rx Drugs Negative     Age   Age between 69-45 years  No     History of Preadolescent Sexual Abuse   History of Preadolescent Sexual Abuse Negative or Female     Psychological Disease   Psychological Disease Negative   Depression Negative     Total Score   Opioid Risk Tool Scoring 0   Opioid Risk Interpretation Low Risk     ORT Scoring interpretation table:  Score <3 = Low Risk for SUD  Score between 4-7 = Moderate Risk for SUD  Score >8 = High Risk for Opioid Abuse   PHQ-2 Depression Scale:  Total score: 0  PHQ-2 Scoring interpretation table: (Score and probability of major depressive disorder)  Score 0 = No depression  Score 1 = 15.4% Probability  Score 2 = 21.1% Probability  Score 3 = 38.4% Probability  Score 4 = 45.5% Probability  Score 5 = 56.4% Probability  Score 6 = 78.6% Probability   PHQ-9 Depression Scale:  Total score: 0  PHQ-9 Scoring interpretation table:  Score 0-4 = No depression  Score 5-9 = Mild depression  Score 10-14 = Moderate depression  Score 15-19 = Moderately severe depression  Score 20-27 = Severe depression (2.4 times higher risk of SUD and 2.89 times higher risk of overuse)   Pharmacologic  Plan: Pending ordered tests and/or consults            Initial impression: Pending review of available data and ordered tests.  Meds   Current Meds  Medication Sig  . alendronate (FOSAMAX) 70 MG tablet Take 70 mg by mouth once a week. Take with a full glass of water on an empty stomach.  . furosemide (LASIX) 20 MG tablet Take 20 mg by mouth daily.  Marland Kitchen gabapentin (NEURONTIN) 300 MG capsule Take 900 mg by mouth 2 (two) times daily.  . insulin NPH Human (HUMULIN N,NOVOLIN N) 100 UNIT/ML  injection Inject 40 Units into the skin at bedtime.  Marland Kitchen lisinopril (PRINIVIL,ZESTRIL) 40 MG tablet Take 40 mg by mouth daily.  . metFORMIN (GLUCOPHAGE) 500 MG tablet Take 1,000 mg by mouth 2 (two) times daily with a meal.  . ranitidine (ZANTAC) 150 MG tablet Take 150 mg by mouth 2 (two) times daily.  . vitamin B-12 (CYANOCOBALAMIN) 1000 MCG tablet Take 1,000 mcg by mouth daily.    Imaging Review  Cervical Imaging: Cervical MR wo contrast: No results found for this or any previous visit. Cervical MR wo contrast: No results found for this or any previous visit. Cervical MR w/wo contrast: No results found for this or any previous visit. Cervical MR w contrast: No results found for this or any previous visit. Cervical CT wo contrast: No results found for this or any previous visit. Cervical CT w/wo contrast: No results found for this or any previous visit. Cervical CT w/wo contrast: No results found for this or any previous visit. Cervical CT w contrast: No results found for this or any previous visit. Cervical CT outside: No results found for this or any previous visit. Cervical DG 1 view: No results found for this or any previous visit. Cervical DG 2-3 views: No results found for this or any previous visit. Cervical DG F/E views: No results found for this or any previous visit. Cervical DG 2-3 clearing views: No results found for this or any previous visit. Cervical DG Bending/F/E views: No results found for this or any previous visit. Cervical DG complete: No results found for this or any previous visit. Cervical DG Myelogram views: No results found for this or any previous visit. Cervical DG Myelogram views: No results found for this or any previous visit. Cervical Discogram views: No results found for this or any previous visit.  Shoulder Imaging: Shoulder-R MR w contrast: No results found for this or any previous visit. Shoulder-L MR w contrast: No results found for this or any  previous visit. Shoulder-R MR w/wo contrast: No results found for this or any previous visit. Shoulder-L MR w/wo contrast: No results found for this or any previous visit. Shoulder-R MR wo contrast: No results found for this or any previous visit. Shoulder-L MR wo contrast: No results found for this or any previous visit. Shoulder-R CT w contrast: No results found for this or any previous visit. Shoulder-L CT w contrast: No results found for this or any previous visit. Shoulder-R CT w/wo contrast: No results found for this or any previous visit. Shoulder-L CT w/wo contrast: No results found for this or any previous visit. Shoulder-R CT wo contrast: No results found for this or any previous visit. Shoulder-L CT wo contrast: No results found for this or any previous visit. Shoulder-R DG Arthrogram: No results found for this or any previous visit. Shoulder-L DG Arthrogram: No results found for this or any previous visit. Shoulder-R DG 1  view: No results found for this or any previous visit. Shoulder-L DG 1 view: No results found for this or any previous visit. Shoulder-R DG:  Results for orders placed in visit on 02/17/08  DG Shoulder Right   Narrative **** PRIOR REPORT IMPORTED FROM AN EXTERNAL SYSTEM ****   PRIOR REPORT IMPORTED FROM THE [employer] WORKFLOW SYSTEM   REASON FOR EXAM:    fall pain  COMMENTS:   PROCEDURE:     DXR - DXR SHOULDER RIGHT COMPLETE  - Feb 17 2008  6:26PM   RESULT:     Images of the RIGHT shoulder demonstrate no definite fracture,  dislocation or radiopaque foreign body. The patient appears to be on a  backboard.   IMPRESSION:   No acute bony abnormality demonstrated.   Thank you for the opportunity to contribute to the care of your patient.       Shoulder-L DG: No results found for this or any previous visit.  Thoracic Imaging: Thoracic MR wo contrast: No results found for this or any previous visit. Thoracic MR wo contrast: No results found for this or  any previous visit. Thoracic MR w/wo contrast: No results found for this or any previous visit. Thoracic MR w contrast: No results found for this or any previous visit. Thoracic CT wo contrast: No results found for this or any previous visit. Thoracic CT w/wo contrast: No results found for this or any previous visit. Thoracic CT w/wo contrast: No results found for this or any previous visit. Thoracic CT w contrast: No results found for this or any previous visit. Thoracic DG 2-3 views: No results found for this or any previous visit. Thoracic DG 4 views: No results found for this or any previous visit. Thoracic DG: No results found for this or any previous visit. Thoracic DG w/swimmers view: No results found for this or any previous visit. Thoracic DG Myelogram views: No results found for this or any previous visit. Thoracic DG Myelogram views: No results found for this or any previous visit.  Lumbosacral Imaging: Lumbar MR wo contrast:  Results for orders placed during the hospital encounter of 07/25/17  MR LUMBAR SPINE WO CONTRAST   Narrative CLINICAL DATA:  Low back pain extending into the right leg and foot, 2-3 weeks duration. Recent fall.  EXAM: MRI LUMBAR SPINE WITHOUT CONTRAST  TECHNIQUE: Multiplanar, multisequence MR imaging of the lumbar spine was performed. No intravenous contrast was administered.  COMPARISON:  CT abdomen 09/05/2008  FINDINGS: Segmentation:  5 lumbar type vertebral bodies.  Alignment: Straightening of the normal lumbar lordosis. 2 mm retrolisthesis L3-4.  Vertebrae:  No fracture or primary bone lesion.  Conus medullaris: Extends to the L1-2 level and appears normal. Incidental lipoma of the filum.  Paraspinal and other soft tissues: Normal  Disc levels:  Minimal, non-compressive disc bulges at T12-L1, L1-2 and L2-3.  L3-4:  Mild bulging of the disc.  No compressive stenosis.  L4-5: Broad-based, right posterolateral disc herniation with  upward migration of a 9 mm fragment behind L4 to the right of midline, likely to result in compression of the right L4 nerve.  L5-S1: Chronic disc degeneration with loss of height. Endplate osteophytes and mild bulging of the disc. No compressive stenosis.  IMPRESSION: Broad-based, right posterolateral predominant disc herniation with upward migration of a 9 mm fragment to the right of midline behind L4, likely to compress the right L4 nerve.  Chronic, grossly non-compressive degenerative changes at other levels as outlined above.   Electronically  Signed   By: Nelson Chimes M.D.   On: 07/25/2017 09:44    Lumbar MR wo contrast: No results found for this or any previous visit. Lumbar MR w/wo contrast: No results found for this or any previous visit. Lumbar MR w contrast: No results found for this or any previous visit. Lumbar CT wo contrast: No results found for this or any previous visit. Lumbar CT w/wo contrast: No results found for this or any previous visit. Lumbar CT w/wo contrast: No results found for this or any previous visit. Lumbar CT w contrast: No results found for this or any previous visit. Lumbar DG 1V: No results found for this or any previous visit. Lumbar DG 1V (Clearing): No results found for this or any previous visit. Lumbar DG 2-3V (Clearing): No results found for this or any previous visit. Lumbar DG 2-3 views: No results found for this or any previous visit. Lumbar DG (Complete) 4+V: No results found for this or any previous visit. Lumbar DG F/E views: No results found for this or any previous visit. Lumbar DG Bending views: No results found for this or any previous visit. Lumbar DG Myelogram views: No results found for this or any previous visit. Lumbar DG Myelogram: No results found for this or any previous visit. Lumbar DG Myelogram: No results found for this or any previous visit. Lumbar DG Myelogram: No results found for this or any previous  visit. Lumbar DG Myelogram Lumbosacral: No results found for this or any previous visit. Lumbar DG Diskogram views: No results found for this or any previous visit. Lumbar DG Diskogram views: No results found for this or any previous visit. Lumbar DG Epidurogram OP: No results found for this or any previous visit. Lumbar DG Epidurogram IP: No results found for this or any previous visit.  Sacroiliac Joint Imaging: Sacroiliac Joint DG: No results found for this or any previous visit. Sacroiliac Joint MR w/wo contrast: No results found for this or any previous visit. Sacroiliac Joint MR wo contrast: No results found for this or any previous visit.  Spine Imaging: Whole Spine DG Myelogram views: No results found for this or any previous visit. Whole Spine MR Mets screen: No results found for this or any previous visit. Whole Spine MR Mets screen: No results found for this or any previous visit. Whole Spine MR w/wo: No results found for this or any previous visit. MRA Spinal Canal w/ cm: No results found for this or any previous visit. MRA Spinal Canal wo/ cm: No results found for this or any previous visit. MRA Spinal Canal w/wo cm: No results found for this or any previous visit. Spine Outside MR Films: No results found for this or any previous visit. Spine Outside CT Films: No results found for this or any previous visit. CT-Guided Biopsy: No results found for this or any previous visit. CT-Guided Needle Placement: No results found for this or any previous visit. DG Spine outside: No results found for this or any previous visit. IR Spine outside: No results found for this or any previous visit. NM Spine outside: No results found for this or any previous visit.  Hip Imaging: Hip-R MR w contrast: No results found for this or any previous visit. Hip-L MR w contrast: No results found for this or any previous visit. Hip-R MR w/wo contrast: No results found for this or any previous  visit. Hip-L MR w/wo contrast: No results found for this or any previous visit. Hip-R MR wo  contrast: No results found for this or any previous visit. Hip-L MR wo contrast: No results found for this or any previous visit. Hip-R CT w contrast: No results found for this or any previous visit. Hip-L CT w contrast: No results found for this or any previous visit. Hip-R CT w/wo contrast: No results found for this or any previous visit. Hip-L CT w/wo contrast: No results found for this or any previous visit. Hip-R CT wo contrast: No results found for this or any previous visit. Hip-L CT wo contrast: No results found for this or any previous visit. Hip-R DG 2-3 views: No results found for this or any previous visit. Hip-L DG 2-3 views: No results found for this or any previous visit. Hip-R DG Arthrogram: No results found for this or any previous visit. Hip-L DG Arthrogram: No results found for this or any previous visit. Hip-B DG Bilateral: No results found for this or any previous visit.  Knee Imaging: Knee-R MR w contrast: No results found for this or any previous visit. Knee-L MR w contrast: No results found for this or any previous visit. Knee-R MR w/wo contrast: No results found for this or any previous visit. Knee-L MR w/wo contrast: No results found for this or any previous visit. Knee-R MR wo contrast: No results found for this or any previous visit. Knee-L MR wo contrast: No results found for this or any previous visit. Knee-R CT w contrast: No results found for this or any previous visit. Knee-L CT w contrast: No results found for this or any previous visit. Knee-R CT w/wo contrast: No results found for this or any previous visit. Knee-L CT w/wo contrast: No results found for this or any previous visit. Knee-R CT wo contrast: No results found for this or any previous visit. Knee-L CT wo contrast: No results found for this or any previous visit. Knee-R DG 1-2 views: No results found  for this or any previous visit. Knee-L DG 1-2 views: No results found for this or any previous visit. Knee-R DG 3 views: No results found for this or any previous visit. Knee-L DG 3 views: No results found for this or any previous visit. Knee-R DG 4 views: No results found for this or any previous visit. Knee-L DG 4 views:  Results for orders placed during the hospital encounter of 07/04/15  DG Knee Complete 4 Views Left   Narrative CLINICAL DATA:  Knee pain began 3 weeks ago, no injury  EXAM: LEFT KNEE - COMPLETE 4+ VIEW  COMPARISON:  None.  FINDINGS: There is primarily bicompartmental degenerative joint disease mainly involving the medial compartment where there is more loss of joint space, sclerosis, and mild spurring. Mild spurring is also noted from the patella on the posterior aspect. There does appear to be a small amount of left knee joint fluid present on the lateral view. No acute abnormality is seen. The patella is relatively normally positioned.  IMPRESSION: 1. Primarily bicompartmental degenerative joint disease of the left knee mainly involving the medial compartment. 2. Small amount of left knee joint fluid.   Electronically Signed   By: Ivar Drape M.D.   On: 07/04/2015 11:37    Knee-R DG Arthrogram: No results found for this or any previous visit. Knee-L DG Arthrogram: No results found for this or any previous visit.  Complexity Note: Imaging results reviewed. Results discussed using Layman's terms.               ROS  Cardiovascular History:  High blood pressure Pulmonary or Respiratory History: Snoring  Neurological History: No reported neurological signs or symptoms such as seizures, abnormal skin sensations, urinary and/or fecal incontinence, being born with an abnormal open spine and/or a tethered spinal cord Review of Past Neurological Studies: No results found for this or any previous visit. Psychological-Psychiatric History: Prone to  panicking Gastrointestinal History: Reflux or heatburn Genitourinary History: No reported renal or genitourinary signs or symptoms such as difficulty voiding or producing urine, peeing blood, non-functioning kidney, kidney stones, difficulty emptying the bladder, difficulty controlling the flow of urine, or chronic kidney disease Hematological History: No reported hematological signs or symptoms such as prolonged bleeding, low or poor functioning platelets, bruising or bleeding easily, hereditary bleeding problems, low energy levels due to low hemoglobin or being anemic Endocrine History: High blood sugar requiring insulin (IDDM) Rheumatologic History: No reported rheumatological signs and symptoms such as fatigue, joint pain, tenderness, swelling, redness, heat, stiffness, decreased range of motion, with or without associated rash Musculoskeletal History: Negative for myasthenia gravis, muscular dystrophy, multiple sclerosis or malignant hyperthermia Work History: Retired  Allergies  Ms. Pincock is allergic to amoxicillin.  Laboratory Chemistry  Inflammation Markers (CRP: Acute Phase) (ESR: Chronic Phase) No results found for: CRP, ESRSEDRATE               Renal Function Markers Lab Results  Component Value Date   BUN 15 07/07/2017   CREATININE 1.19 (H) 07/07/2017   GFRAA 52 (L) 07/07/2017   GFRNONAA 45 (L) 07/07/2017                 Hepatic Function Markers No results found for: AST, ALT, ALBUMIN, ALKPHOS, HCVAB               Electrolytes Lab Results  Component Value Date   NA 142 07/07/2017   K 3.3 (L) 07/07/2017   CL 110 07/07/2017   CALCIUM 8.4 (L) 07/07/2017                 Neuropathy Markers No results found for: [id number]               Bone Pathology Markers Lab Results  Component Value Date   CALCIUM 8.4 (L) 07/07/2017                 Coagulation Parameters Lab Results  Component Value Date   PLT 194 07/07/2017                 Cardiovascular  Markers Lab Results  Component Value Date   HGB 11.1 (L) 07/07/2017   HCT 33.2 (L) 07/07/2017                 Note: Lab results reviewed.  [place]  Drug: Ms. Fonseca  reports that she does not use drugs. Alcohol:  reports that she drinks alcohol. Tobacco:  reports that she has quit smoking. She has never used smokeless tobacco. Medical:  has a past medical history of Diabetes mellitus without complication ([place]); Hyperlipemia; Hypertension; and Neuropathy. Family: family history includes Cancer in her mother and sister; Diabetes in her father and mother; Heart disease in her brother and father; Hypertension in her mother.  Past Surgical History:  Procedure Laterality Date  . ABDOMINAL HYSTERECTOMY    . APPENDECTOMY    . CHOLECYSTECTOMY    . SPINE SURGERY  1998   cervical fusion   Active Ambulatory Problems    Diagnosis Date Noted  . Lumbar spondylosis 08/05/2017  . Lumbar  degenerative disc disease 08/05/2017  . Lumbar radiculopathy 08/05/2017  . S/P cervical spinal fusion 08/05/2017  . Neuropathy 08/05/2017  . Right leg pain 08/05/2017   Resolved Ambulatory Problems    Diagnosis Date Noted  . No Resolved Ambulatory Problems   Past Medical History:  Diagnosis Date  . Diabetes mellitus without complication ([place])   . Hyperlipemia   . Hypertension   . Neuropathy    Constitutional Exam  General appearance: Well nourished, well developed, and well hydrated. In no apparent acute distress Vitals:   08/05/17 1147  BP: 139/87  Pulse: (!) 103  Resp: 18  SpO2: 98%  Weight: 188 lb 6.4 oz (85.5 kg)  Height: _0  (1.549 m)   BMI Assessment: Estimated body mass index is 35.6 kg/m as calculated from the following:   Height as of this encounter: _1  (1.549 m).   Weight as of this encounter: 188 lb 6.4 oz (85.5 kg).  BMI interpretation table: BMI level Category Range association with higher incidence of chronic pain  <18 kg/m2 Underweight   18.5-24.9 kg/m2 Ideal body  weight   25-29.9 kg/m2 Overweight Increased incidence by 20%  30-34.9 kg/m2 Obese (Class I) Increased incidence by 68%  35-39.9 kg/m2 Severe obesity (Class II) Increased incidence by 136%  >40 kg/m2 Extreme obesity (Class III) Increased incidence by 254%   BMI Readings from Last 4 Encounters:  08/05/17 35.60 kg/m  07/07/17 35.90 kg/m   Wt Readings from Last 4 Encounters:  08/05/17 188 lb 6.4 oz (85.5 kg)  07/07/17 190 lb (86.2 kg)  Psych/Mental status: Alert, oriented x 3 (person, place, & time)       Eyes: PERLA Respiratory: No evidence of acute respiratory distress  Cervical Spine Area Exam  Skin & Axial Inspection: No masses, redness, edema, swelling, or associated skin lesions Alignment: Symmetrical Functional ROM: Unrestricted ROM      Stability: No instability detected Muscle Tone/Strength: Functionally intact. No obvious neuro-muscular anomalies detected. Sensory (Neurological): Unimpaired Palpation: No palpable anomalies              Upper Extremity (UE) Exam    Side: Right upper extremity  Side: Left upper extremity  Skin & Extremity Inspection: Skin color, temperature, and hair growth are WNL. No peripheral edema or cyanosis. No masses, redness, swelling, asymmetry, or associated skin lesions. No contractures.  Skin & Extremity Inspection: Skin color, temperature, and hair growth are WNL. No peripheral edema or cyanosis. No masses, redness, swelling, asymmetry, or associated skin lesions. No contractures.  Functional ROM: Unrestricted ROM          Functional ROM: Unrestricted ROM          Muscle Tone/Strength: Functionally intact. No obvious neuro-muscular anomalies detected.  Muscle Tone/Strength: Functionally intact. No obvious neuro-muscular anomalies detected.  Sensory (Neurological): Unimpaired  Sensory (Neurological): Unimpaired  Palpation: No palpable anomalies              Palpation: No palpable anomalies              Specialized Test(s): Deferred          Specialized Test(s): Deferred          Thoracic Spine Area Exam  Skin & Axial Inspection: No masses, redness, or swelling Alignment: Symmetrical Functional ROM: Unrestricted ROM Stability: No instability detected Muscle Tone/Strength: Functionally intact. No obvious neuro-muscular anomalies detected. Sensory (Neurological): Unimpaired Muscle strength & Tone: No palpable anomalies  Lumbar Spine Area Exam  Skin & Axial Inspection: No masses, redness,  or swelling Alignment: Symmetrical Functional ROM: Unrestricted ROM      Stability: No instability detected Muscle Tone/Strength: Functionally intact. No obvious neuro-muscular anomalies detected. Sensory (Neurological): Unimpaired Palpation: No palpable anomalies       Provocative Tests: Lumbar Hyperextension and rotation test: evaluation deferred today       Lumbar Lateral bending test: evaluation deferred today       Patrick's Maneuver: evaluation deferred today                    Gait & Posture Assessment  Ambulation: Unassisted Gait: Relatively normal for age and body habitus Posture: WNL   Lower Extremity Exam    Side: Right lower extremity  Side: Left lower extremity  Skin & Extremity Inspection: Skin color, temperature, and hair growth are WNL. No peripheral edema or cyanosis. No masses, redness, swelling, asymmetry, or associated skin lesions. No contractures.  Skin & Extremity Inspection: Skin color, temperature, and hair growth are WNL. No peripheral edema or cyanosis. No masses, redness, swelling, asymmetry, or associated skin lesions. No contractures.  Functional ROM: Unrestricted ROM          Functional ROM: Unrestricted ROM          Muscle Tone/Strength: Functionally intact. No obvious neuro-muscular anomalies detected.  Muscle Tone/Strength: Functionally intact. No obvious neuro-muscular anomalies detected.  Sensory (Neurological): Unimpaired  Sensory (Neurological): Unimpaired  Palpation: No palpable anomalies   Palpation: No palpable anomalies   Assessment  Primary Diagnosis & Pertinent Problem List: The primary encounter diagnosis was Lumbar radiculopathy. Diagnoses of Lumbar spondylosis, Lumbar degenerative disc disease, S/P cervical spinal fusion, Neuropathy, and Right leg pain were also pertinent to this visit.  Visit Diagnosis (New problems to examiner): 1. Lumbar radiculopathy   2. Lumbar spondylosis   3. Lumbar degenerative disc disease   4. S/P cervical spinal fusion   5. Neuropathy   6. Right leg pain    Plan of Care (Initial workup plan)  Note: Please be advised that as per protocol, today's visit has been an evaluation only. We have not taken over the patient's controlled substance management.  Problem-specific plan: No problem-specific Assessment & Plan notes found for this encounter.  Ordered Lab-work, Procedure(s), Referral(s), & Consult(s): Orders Placed This Encounter  Procedures  . Lumbar Epidural Injection  . DG Tibia/Fibula Right   Pharmacotherapy (current): Medications ordered:  No orders of the defined types were placed in this encounter.  Medications administered during this visit: Ms. Sakuma had no medications administered during this visit.   Pharmacological management options:  Opioid Analgesics: The patient was informed that there is no guarantee that she would be a candidate for opioid analgesics. The decision will be made following CDC guidelines. This decision will be based on the results of diagnostic studies, as well as Ms. Kastens's risk profile.   Membrane stabilizer: To be determined at a later time  Muscle relaxant: To be determined at a later time  NSAID: To be determined at a later time  Other analgesic(s): To be determined at a later time   Interventional management options: Ms. Landa was informed that there is no guarantee that she would be a candidate for interventional therapies. The decision will be based on the results of  diagnostic studies, as well as Ms. Coco's risk profile.  Procedure(s) under consideration:  ***   Provider-requested follow-up: Return for Procedure.  No future appointments.  Primary Care Physician: Lynnell Jude, MD Location: [facility] Note by: Carlus Pavlov  Holley Raring, M.D, Date: 08/05/2017; Time: 4:10 PM  Patient Instructions  Do not eat or drink for 3 hours prior to the procedure. Take your blood pressure medication the morning of the procedure. Do not take your diabetic medication the morning of the procedure. You were instructed to get an xray done today.    Today we did the following -schedule you for ESI -xray of your right leg to evaluate fracture  Thank you for visiting the [facility]:  -Remember to bring all your pain pills to every clinic visit, in their original containers. -Because of the high volume of calls we receive and the high demand for our clinical services, we are occupied all day providing care for patients in the clinic. This leaves little time to respond to phone calls, and we are generally unable to discuss patient care advice over the telephone. If you are experiencing a medication side effect or complication, you can call and let us know, but we will typically not make a medication substitution or change over the telephone. We are generally unable to respond acutely to a flare up of pain, as this is quite common in our patients and needs to be dealt with as part of the long term management plan. Please make an appointment with [place] if you wish to discuss a matter in any detail. Should you still need to call, please do so at . Please do not call for early medication refills.  Thank you for choosing [facility]. It was a pleasure to see you in clinic today. Please contact us with any questions or concerns at  Gillis Santa, MD

## 2017-08-05 NOTE — Patient Instructions (Addendum)
Do not eat or drink for 3 hours prior to the procedure. Take your blood pressure medication the morning of the procedure. Do not take your diabetic medication the morning of the procedure. You were instructed to get an xray done today.    Today we did the following -schedule you for ESI -xray of your right leg to evaluate fracture  Thank you for visiting the [facility]:  -Remember to bring all your pain pills to every clinic visit, in their original containers. -Because of the high volume of calls we receive and the high demand for our clinical services, we are occupied all day providing care for patients in the clinic. This leaves little time to respond to phone calls, and we are generally unable to discuss patient care advice over the telephone. If you are experiencing a medication side effect or complication, you can call and let us know, but we will typically not make a medication substitution or change over the telephone. We are generally unable to respond acutely to a flare up of pain, as this is quite common in our patients and needs to be dealt with as part of the long term management plan. Please make an appointment with [place] if you wish to discuss a matter in any detail. Should you still need to call, please do so at . Please do not call for early medication refills.  Thank you for choosing [facility]. It was a pleasure to see you in clinic today. Please contact us with any questions or concerns at  Gillis Santa, MD

## 2017-08-05 NOTE — Progress Notes (Signed)
Safety precautions to be maintained throughout the outpatient stay will include: orient to surroundings, keep bed in low position, maintain call bell within reach at all times, provide assistance with transfer out of bed and ambulation.  

## 2017-08-05 NOTE — Progress Notes (Signed)
Patient's Name: Kelsey Mcdowell  MRN: [id number]  Referring Provider: Marin Mcdowell, Kelsey Mcdowell  DOB: Mar 08, 1946  PCP: Kelsey Jude, MD  DOS: 08/05/2017  Note by: Gillis Santa, MD  Service setting: Ambulatory outpatient  Specialty: Interventional Pain Management  Location: [facility] (AMB) Pain Management Facility    Patient type: New patient ("FAST-TRACK" Evaluation)   Warning: This referral option does not include the extensive pharmacological evaluation required for [place] to take over the patient's medication management. The "Fast-Track" system is designed to bypass the new patient referral waiting list, as well as the normal patient evaluation process, in order to provide a patient in distress with a timely pain management intervention. Because the system was not designed to unfairly get a patient into our pain practice ahead of those already waiting, certain restrictions apply. By requesting a "Fast-Track" consult, the referring physician has opted to continue managing the patient's medications in order to get interventional urgent care.  Primary Reason for Visit: Interventional Pain Management Treatment. CC: Back Pain (low); Leg Pain (right); and Hip Pain (right)   Procedure  HPI  Kelsey Mcdowell is a 71 y.o. year old, female patient, who comes today for a  "Fast-Track" new patient evaluation, as requested by Kelsey Olp, PA-C. The patient has been made aware that this type of referral option is reserved for the Interventional Pain Management portion of our practice and completely excludes the option of medication management. Her primarily concern today is the Back Pain (low); Leg Pain (right); and Hip Pain (right)  Pain Assessment: Location: Lower Back Radiating: right hip and leg Onset: 1 to 4 weeks ago Duration: Acute pain Quality: Nagging, Aching, Dull Severity: 5 /10 (self-reported pain score)  Note: Reported level is compatible with observation.                   Effect on ADL:    Timing: Constant Modifying factors: sitting, medications,heat, hot showers  Onset and Duration: Sudden and Present less than 3 months Cause of pain: passed out and fell Severity: Getting better, NAS-11 at its worse: 10/10, NAS-11 at its best: 6/10, NAS-11 now: 4/10 and NAS-11 on the average: 4/10 Timing: Morning and Afternoon Aggravating Factors: Prolonged sitting and Walking Alleviating Factors: Medications Associated Problems: Weakness Quality of Pain: Agonizing, Burning, Nagging and Sharp Previous Examinations or Tests: MRI scan Previous Treatments: Steroid treatments by mouth  Kelsey Mcdowell is a 71 y.o. year old, female patient, who comes today to see [place] for the first time for an initial evaluation of her chronic pain. She has Lumbar spondylosis; Lumbar degenerative disc disease; Lumbar radiculopathy; S/P cervical spinal fusion; Neuropathy; and Right leg pain on her problem list. Today she comes in for evaluation of her Back Pain (low); Leg Pain (right); and Hip Pain (right)  The patient comes into the clinics today, referred to [place] for a lumbar epidural steroid injection secondary to lumbar radiculopathy, chronic lumbar degenerative disc disease, lumbar spondylosis. Of note patient has axial low back pain that radiates to her right leg. Patient fell less than a month ago and has been experiencing back pain with radiation to her right leg since then. Pain is intermittent. Described as achy throbbing and dull with intermittent bursts of sharp S and shooting pain down to her right leg. Patient states that she is experiencing significant weakness in her right leg and that her right leg will occasionally "go out". No bowel or bladder dysfunction noted.  Currently on gabapentin 600 mg twice a day and  nortriptyline daily at bedtime. Of note patient did have a cervical spine fusion approximately 10-15 years ago.  Meds   Current Meds  Medication Sig  . alendronate (FOSAMAX) 70 MG tablet Take 70  mg by mouth once a week. Take with a full glass of water on an empty stomach.  . furosemide (LASIX) 20 MG tablet Take 20 mg by mouth daily.  Marland Kitchen gabapentin (NEURONTIN) 300 MG capsule Take 900 mg by mouth 2 (two) times daily.  . insulin NPH Human (HUMULIN N,NOVOLIN N) 100 UNIT/ML injection Inject 40 Units into the skin at bedtime.  Marland Kitchen lisinopril (PRINIVIL,ZESTRIL) 40 MG tablet Take 40 mg by mouth daily.  . metFORMIN (GLUCOPHAGE) 500 MG tablet Take 1,000 mg by mouth 2 (two) times daily with a meal.  . ranitidine (ZANTAC) 150 MG tablet Take 150 mg by mouth 2 (two) times daily.  . vitamin B-12 (CYANOCOBALAMIN) 1000 MCG tablet Take 1,000 mcg by mouth daily.    Imaging Review   Shoulder-R DG:  Results for orders placed in visit on 02/17/08  DG Shoulder Right   Narrative  PRIOR REPORT IMPORTED FROM AN EXTERNAL SYSTEM   PRIOR REPORT IMPORTED FROM THE [facility] [place] EXAM:    fall pain  COMMENTS:   PROCEDURE:     DXR - DXR SHOULDER RIGHT COMPLETE  - Feb 17 2008  6:26PM   RESULT:     Images of the RIGHT shoulder demonstrate no definite fracture,  dislocation or radiopaque foreign body. The patient appears to be on a  backboard.   IMPRESSION:   No acute bony abnormality demonstrated.   Thank you for the opportunity to contribute to the care of your patient.        Lumbosacral Imaging: Lumbar MR wo contrast:  Results for orders placed during the hospital encounter of 07/25/17  MR LUMBAR SPINE WO CONTRAST   Narrative CLINICAL DATA:  Low back pain extending into the right leg and foot, 2-3 weeks duration. Recent fall.  EXAM: MRI LUMBAR SPINE WITHOUT CONTRAST  TECHNIQUE: Multiplanar, multisequence MR imaging of the lumbar spine was performed. No intravenous contrast was administered.  COMPARISON:  CT abdomen 09/05/2008  FINDINGS: Segmentation:  5 lumbar type vertebral bodies.  Alignment: Straightening of the normal lumbar lordosis. 2 mm retrolisthesis  L3-4.  Vertebrae:  No fracture or primary bone lesion.  Conus medullaris: Extends to the L1-2 level and appears normal. Incidental lipoma of the filum.  Paraspinal and other soft tissues: Normal  Disc levels:  Minimal, non-compressive disc bulges at T12-L1, L1-2 and L2-3.  L3-4:  Mild bulging of the disc.  No compressive stenosis.  L4-5: Broad-based, right posterolateral disc herniation with upward migration of a 9 mm fragment behind L4 to the right of midline, likely to result in compression of the right L4 nerve.  L5-S1: Chronic disc degeneration with loss of height. Endplate osteophytes and mild bulging of the disc. No compressive stenosis.  IMPRESSION: Broad-based, right posterolateral predominant disc herniation with upward migration of a 9 mm fragment to the right of midline behind L4, likely to compress the right L4 nerve.  Chronic, grossly non-compressive degenerative changes at other levels as outlined above.   Electronically Signed   By: Nelson Chimes M.D.   On: 07/25/2017 09:44     Knee-L DG 4 views:  Results for orders placed during the hospital encounter of 07/04/15  DG Knee Complete 4 Views Left   Narrative CLINICAL DATA:  Knee pain began 3  weeks ago, no injury  EXAM: LEFT KNEE - COMPLETE 4+ VIEW  COMPARISON:  None.  FINDINGS: There is primarily bicompartmental degenerative joint disease mainly involving the medial compartment where there is more loss of joint space, sclerosis, and mild spurring. Mild spurring is also noted from the patella on the posterior aspect. There does appear to be a small amount of left knee joint fluid present on the lateral view. No acute abnormality is seen. The patella is relatively normally positioned.  IMPRESSION: 1. Primarily bicompartmental degenerative joint disease of the left knee mainly involving the medial compartment. 2. Small amount of left knee joint fluid.   Electronically Signed   By: Ivar Drape  M.D.   On: 07/04/2015 11:37     Complexity Note: Imaging results reviewed. Results discussed using Layman's terms.               ROS  Cardiovascular History: High blood pressure Pulmonary or Respiratory History: Snoring  Neurological History: No reported neurological signs or symptoms such as seizures, abnormal skin sensations, urinary and/or fecal incontinence, being born with an abnormal open spine and/or a tethered spinal cord Review of Past Neurological Studies: No results found for this or any previous visit. Psychological-Psychiatric History: Prone to panicking Gastrointestinal History: Reflux or heatburn Genitourinary History: No reported renal or genitourinary signs or symptoms such as difficulty voiding or producing urine, peeing blood, non-functioning kidney, kidney stones, difficulty emptying the bladder, difficulty controlling the flow of urine, or chronic kidney disease Hematological History: No reported hematological signs or symptoms such as prolonged bleeding, low or poor functioning platelets, bruising or bleeding easily, hereditary bleeding problems, low energy levels due to low hemoglobin or being anemic Endocrine History: High blood sugar requiring insulin (IDDM) Rheumatologic History: No reported rheumatological signs and symptoms such as fatigue, joint pain, tenderness, swelling, redness, heat, stiffness, decreased range of motion, with or without associated rash Musculoskeletal History: Negative for myasthenia gravis, muscular dystrophy, multiple sclerosis or malignant hyperthermia Work History: Retired   Allergies  Ms. Harbach is allergic to amoxicillin.  Laboratory Chemistry  Inflammation Markers (CRP: Acute Phase) (ESR: Chronic Phase) No results found for: CRP, ESRSEDRATE               Renal Function Markers Lab Results  Component Value Date   BUN 15 07/07/2017   CREATININE 1.19 (H) 07/07/2017   GFRAA 52 (L) 07/07/2017   GFRNONAA 45 (L) 07/07/2017                  Hepatic Function Markers No results found for: AST, ALT, ALBUMIN, ALKPHOS, HCVAB               Electrolytes Lab Results  Component Value Date   NA 142 07/07/2017   K 3.3 (L) 07/07/2017   CL 110 07/07/2017   CALCIUM 8.4 (L) 07/07/2017                 Neuropathy Markers No results found for: [id number]               Bone Pathology Markers Lab Results  Component Value Date   CALCIUM 8.4 (L) 07/07/2017                 Coagulation Parameters Lab Results  Component Value Date   PLT 194 07/07/2017                 Cardiovascular Markers Lab Results  Component Value Date   HGB 11.1 (L) 07/07/2017  HCT 33.2 (L) 07/07/2017                 Note: Lab results reviewed.  [place]  Drug: Ms. Dumire  reports that she does not use drugs. Alcohol:  reports that she drinks alcohol. Tobacco:  reports that she has quit smoking. She has never used smokeless tobacco. Medical:  has a past medical history of Diabetes mellitus without complication ([place]); Hyperlipemia; Hypertension; and Neuropathy. Family: family history includes Cancer in her mother and sister; Diabetes in her father and mother; Heart disease in her brother and father; Hypertension in her mother.  Past Surgical History:  Procedure Laterality Date  . ABDOMINAL HYSTERECTOMY    . APPENDECTOMY    . CHOLECYSTECTOMY    . SPINE SURGERY  1998   cervical fusion   Active Ambulatory Problems    Diagnosis Date Noted  . Lumbar spondylosis 08/05/2017  . Lumbar degenerative disc disease 08/05/2017  . Lumbar radiculopathy 08/05/2017  . S/P cervical spinal fusion 08/05/2017  . Neuropathy 08/05/2017  . Right leg pain 08/05/2017   Resolved Ambulatory Problems    Diagnosis Date Noted  . No Resolved Ambulatory Problems   Past Medical History:  Diagnosis Date  . Diabetes mellitus without complication ([place])   . Hyperlipemia   . Hypertension   . Neuropathy    Constitutional Exam  General appearance: Well  nourished, well developed, and well hydrated. In no apparent acute distress Vitals:   08/05/17 1147  BP: 139/87  Pulse: (!) 103  Resp: 18  SpO2: 98%  Weight: 188 lb 6.4 oz (85.5 kg)  Height: '5\' 1"'  (1.549 m)   BMI Assessment: Estimated body mass index is 35.6 kg/m as calculated from the following:   Height as of this encounter: '5\' 1"'  (1.549 m).   Weight as of this encounter: 188 lb 6.4 oz (85.5 kg).  BMI interpretation table: BMI level Category Range association with higher incidence of chronic pain  <18 kg/m2 Underweight   18.5-24.9 kg/m2 Ideal body weight   25-29.9 kg/m2 Overweight Increased incidence by 20%  30-34.9 kg/m2 Obese (Class I) Increased incidence by 68%  35-39.9 kg/m2 Severe obesity (Class II) Increased incidence by 136%  >40 kg/m2 Extreme obesity (Class III) Increased incidence by 254%   BMI Readings from Last 4 Encounters:  08/05/17 35.60 kg/m  07/07/17 35.90 kg/m   Wt Readings from Last 4 Encounters:  08/05/17 188 lb 6.4 oz (85.5 kg)  07/07/17 190 lb (86.2 kg)  Psych/Mental status: Alert, oriented x 3 (person, place, & time)       Eyes: PERLA Respiratory: No evidence of acute respiratory distress  Lumbar Spine Area Exam  Skin & Axial Inspection: No masses, redness, or swelling Alignment: Symmetrical Functional ROM: Unrestricted ROM      Stability: No instability detected Muscle Tone/Strength: Functionally intact. No obvious neuro-muscular anomalies detected. Sensory (Neurological): Movement-associated pain Palpation: Tender over right paraspinals       Provocative Tests: Lumbar Hyperextension and rotation test: Positive       Lumbar Lateral bending test: Positive       Patrick's Maneuver: Negative                    Gait & Posture Assessment  Ambulation: Limited Gait: Antalgic Posture: WNL   Lower Extremity Exam    Side: Right lower extremity  Side: Left lower extremity  Skin & Extremity Inspection: Skin color, temperature, and hair growth are  WNL. No peripheral edema or cyanosis. No masses,  redness, swelling, asymmetry, or associated skin lesions. No contractures.  Skin & Extremity Inspection: Skin color, temperature, and hair growth are WNL. No peripheral edema or cyanosis. No masses, redness, swelling, asymmetry, or associated skin lesions. No contractures.  Functional ROM: Unrestricted ROM          Functional ROM: Unrestricted ROM          Muscle Tone/Strength: Movement possible against some resistance (4/5) knee flexion, knee extension, hip flexion   Muscle Tone/Strength: Functionally intact. No obvious neuro-muscular anomalies detected.  Sensory (Neurological): Movement-associated pain  Sensory (Neurological): Unimpaired  Palpation: Patient's right leg tender along tibial tuberosity  Palpation: No palpable anomalies    Assessment and plan:  1. Lumbar radiculopathy   2. Lumbar spondylosis   3. Lumbar degenerative disc disease   4. S/P cervical spinal fusion   5. Neuropathy   6. Right leg pain     71 year old female with a past medical history of diabetes, hypertension, ACDF who presents with axial low back pain with radiation to right greater than left leg that she describes as burning and tingling. This started after the patient's fall about a month ago secondary to hypoglycemia. Patient's lumbar MRI shows Broad-based, right posterolateral predominant disc herniation with upward migration of a 9 mm fragment to the right of midline behind L4, likely to compress the right L4 nerve.  The central disc herniation is likely contributing to this patient's symptoms. I discussed doing a L4-L5 interlaminar epidural steroid injection with the patient to help control her axial low back and radicular pain. Patient was in agreement with the plan after risks and benefits were discussed. No further questions or concerns.  Plan: #1. Interlaminar epidural steroid injection at L4-L5, targeting the right side preferentially #2. X-ray of right  tibia/fibula to evaluate for fracture as patient endorses tenderness over tibial tuberosity. #3. Continue medication management with current prescriber.  Orders Placed This Encounter  Procedures  . Lumbar Epidural Injection    Standing Status:   Future    Standing Expiration Date:   09/04/2017    Scheduling Instructions:     Side: Midline- at L4/L5 interspace- RIGHT      Sedation: Patient's choice.     Timeframe: ASAP    Order Specific Question:   Where will this procedure be performed?    Answer:   [facility]  . DG Tibia/Fibula Right    Standing Status:   Future    Number of Occurrences:   1    Standing Expiration Date:   10/05/2018    Order Specific Question:   Reason for Exam (SYMPTOM  OR DIAGNOSIS REQUIRED)    Answer:   s/p fall 3 weeks ago, eval fracture    Order Specific Question:   Preferred imaging location?    Answer:   [facility]    Order Specific Question:   Radiology Contrast Protocol - do NOT remove file path    Answer:   [email or web address]

## 2017-10-01 DIAGNOSIS — E1140 Type 2 diabetes mellitus with diabetic neuropathy, unspecified: Secondary | ICD-10-CM | POA: Diagnosis not present

## 2018-01-07 DIAGNOSIS — I10 Essential (primary) hypertension: Secondary | ICD-10-CM | POA: Diagnosis not present

## 2018-01-07 DIAGNOSIS — F411 Generalized anxiety disorder: Secondary | ICD-10-CM | POA: Diagnosis not present

## 2018-01-07 DIAGNOSIS — E782 Mixed hyperlipidemia: Secondary | ICD-10-CM | POA: Diagnosis not present

## 2018-01-07 DIAGNOSIS — Z6835 Body mass index (BMI) 35.0-35.9, adult: Secondary | ICD-10-CM | POA: Diagnosis not present

## 2018-01-07 DIAGNOSIS — E1140 Type 2 diabetes mellitus with diabetic neuropathy, unspecified: Secondary | ICD-10-CM | POA: Diagnosis not present

## 2018-01-07 DIAGNOSIS — K219 Gastro-esophageal reflux disease without esophagitis: Secondary | ICD-10-CM | POA: Diagnosis not present

## 2018-04-15 DIAGNOSIS — I10 Essential (primary) hypertension: Secondary | ICD-10-CM | POA: Diagnosis not present

## 2018-04-15 DIAGNOSIS — E782 Mixed hyperlipidemia: Secondary | ICD-10-CM | POA: Diagnosis not present

## 2018-04-15 DIAGNOSIS — Z6835 Body mass index (BMI) 35.0-35.9, adult: Secondary | ICD-10-CM | POA: Diagnosis not present

## 2018-04-15 DIAGNOSIS — E1140 Type 2 diabetes mellitus with diabetic neuropathy, unspecified: Secondary | ICD-10-CM | POA: Diagnosis not present

## 2018-04-15 DIAGNOSIS — Z79899 Other long term (current) drug therapy: Secondary | ICD-10-CM | POA: Diagnosis not present

## 2018-04-15 DIAGNOSIS — F411 Generalized anxiety disorder: Secondary | ICD-10-CM | POA: Diagnosis not present

## 2018-07-08 DIAGNOSIS — E782 Mixed hyperlipidemia: Secondary | ICD-10-CM | POA: Diagnosis not present

## 2018-07-08 DIAGNOSIS — Z79899 Other long term (current) drug therapy: Secondary | ICD-10-CM | POA: Diagnosis not present

## 2018-07-08 DIAGNOSIS — M545 Low back pain: Secondary | ICD-10-CM | POA: Diagnosis not present

## 2018-07-08 DIAGNOSIS — L409 Psoriasis, unspecified: Secondary | ICD-10-CM | POA: Diagnosis not present

## 2018-07-08 DIAGNOSIS — F411 Generalized anxiety disorder: Secondary | ICD-10-CM | POA: Diagnosis not present

## 2018-07-08 DIAGNOSIS — Z6835 Body mass index (BMI) 35.0-35.9, adult: Secondary | ICD-10-CM | POA: Diagnosis not present

## 2018-07-08 DIAGNOSIS — K219 Gastro-esophageal reflux disease without esophagitis: Secondary | ICD-10-CM | POA: Diagnosis not present

## 2018-07-08 DIAGNOSIS — Z0001 Encounter for general adult medical examination with abnormal findings: Secondary | ICD-10-CM | POA: Diagnosis not present

## 2018-07-08 DIAGNOSIS — G4733 Obstructive sleep apnea (adult) (pediatric): Secondary | ICD-10-CM | POA: Diagnosis not present

## 2018-07-08 DIAGNOSIS — I10 Essential (primary) hypertension: Secondary | ICD-10-CM | POA: Diagnosis not present

## 2018-07-08 DIAGNOSIS — E1140 Type 2 diabetes mellitus with diabetic neuropathy, unspecified: Secondary | ICD-10-CM | POA: Diagnosis not present

## 2018-07-22 DIAGNOSIS — R0602 Shortness of breath: Secondary | ICD-10-CM | POA: Diagnosis not present

## 2018-07-22 DIAGNOSIS — G4733 Obstructive sleep apnea (adult) (pediatric): Secondary | ICD-10-CM | POA: Diagnosis not present

## 2018-07-23 DIAGNOSIS — G4733 Obstructive sleep apnea (adult) (pediatric): Secondary | ICD-10-CM | POA: Diagnosis not present

## 2018-07-23 DIAGNOSIS — R0602 Shortness of breath: Secondary | ICD-10-CM | POA: Diagnosis not present

## 2018-08-20 DIAGNOSIS — Z79899 Other long term (current) drug therapy: Secondary | ICD-10-CM | POA: Diagnosis not present

## 2018-08-20 DIAGNOSIS — L400 Psoriasis vulgaris: Secondary | ICD-10-CM | POA: Diagnosis not present

## 2018-08-31 DIAGNOSIS — Z6833 Body mass index (BMI) 33.0-33.9, adult: Secondary | ICD-10-CM | POA: Diagnosis not present

## 2018-08-31 DIAGNOSIS — M10271 Drug-induced gout, right ankle and foot: Secondary | ICD-10-CM | POA: Diagnosis not present

## 2018-09-01 DIAGNOSIS — Z86010 Personal history of colonic polyps: Secondary | ICD-10-CM | POA: Diagnosis not present

## 2018-09-30 ENCOUNTER — Ambulatory Visit: Payer: PPO | Admitting: Certified Registered"

## 2018-09-30 ENCOUNTER — Encounter
Admission: RE | Disposition: A | Discharge status: Home / Self Care | Payer: Self-pay | Source: Ambulatory Visit | Attending: Internal Medicine

## 2018-09-30 ENCOUNTER — Encounter: Payer: Self-pay | Admitting: Certified Registered"

## 2018-09-30 ENCOUNTER — Other Ambulatory Visit: Payer: Self-pay

## 2018-09-30 ENCOUNTER — Ambulatory Visit
Admission: RE | Admit: 2018-09-30 | Discharge: 2018-09-30 | Disposition: A | Discharge status: Home / Self Care | Payer: PPO | Source: Ambulatory Visit | Attending: Internal Medicine | Admitting: Internal Medicine

## 2018-09-30 DIAGNOSIS — Z981 Arthrodesis status: Secondary | ICD-10-CM | POA: Insufficient documentation

## 2018-09-30 DIAGNOSIS — E785 Hyperlipidemia, unspecified: Secondary | ICD-10-CM | POA: Insufficient documentation

## 2018-09-30 DIAGNOSIS — E7800 Pure hypercholesterolemia, unspecified: Secondary | ICD-10-CM | POA: Insufficient documentation

## 2018-09-30 DIAGNOSIS — M1990 Unspecified osteoarthritis, unspecified site: Secondary | ICD-10-CM | POA: Diagnosis not present

## 2018-09-30 DIAGNOSIS — G709 Myoneural disorder, unspecified: Secondary | ICD-10-CM | POA: Insufficient documentation

## 2018-09-30 DIAGNOSIS — D123 Benign neoplasm of transverse colon: Secondary | ICD-10-CM | POA: Diagnosis not present

## 2018-09-30 DIAGNOSIS — K635 Polyp of colon: Secondary | ICD-10-CM | POA: Diagnosis not present

## 2018-09-30 DIAGNOSIS — D120 Benign neoplasm of cecum: Secondary | ICD-10-CM | POA: Insufficient documentation

## 2018-09-30 DIAGNOSIS — Z79899 Other long term (current) drug therapy: Secondary | ICD-10-CM | POA: Diagnosis not present

## 2018-09-30 DIAGNOSIS — I10 Essential (primary) hypertension: Secondary | ICD-10-CM | POA: Insufficient documentation

## 2018-09-30 DIAGNOSIS — K640 First degree hemorrhoids: Secondary | ICD-10-CM | POA: Insufficient documentation

## 2018-09-30 DIAGNOSIS — K219 Gastro-esophageal reflux disease without esophagitis: Secondary | ICD-10-CM | POA: Diagnosis not present

## 2018-09-30 DIAGNOSIS — Z9049 Acquired absence of other specified parts of digestive tract: Secondary | ICD-10-CM | POA: Diagnosis not present

## 2018-09-30 DIAGNOSIS — Z800 Family history of malignant neoplasm of digestive organs: Secondary | ICD-10-CM | POA: Insufficient documentation

## 2018-09-30 DIAGNOSIS — Z880 Allergy status to penicillin: Secondary | ICD-10-CM | POA: Insufficient documentation

## 2018-09-30 DIAGNOSIS — E1140 Type 2 diabetes mellitus with diabetic neuropathy, unspecified: Secondary | ICD-10-CM | POA: Diagnosis not present

## 2018-09-30 DIAGNOSIS — D126 Benign neoplasm of colon, unspecified: Secondary | ICD-10-CM | POA: Diagnosis not present

## 2018-09-30 DIAGNOSIS — Z7984 Long term (current) use of oral hypoglycemic drugs: Secondary | ICD-10-CM | POA: Diagnosis not present

## 2018-09-30 DIAGNOSIS — G4730 Sleep apnea, unspecified: Secondary | ICD-10-CM | POA: Insufficient documentation

## 2018-09-30 DIAGNOSIS — Z86010 Personal history of colonic polyps: Secondary | ICD-10-CM | POA: Diagnosis not present

## 2018-09-30 DIAGNOSIS — D122 Benign neoplasm of ascending colon: Secondary | ICD-10-CM | POA: Diagnosis not present

## 2018-09-30 DIAGNOSIS — Z8379 Family history of other diseases of the digestive system: Secondary | ICD-10-CM | POA: Insufficient documentation

## 2018-09-30 DIAGNOSIS — Z8249 Family history of ischemic heart disease and other diseases of the circulatory system: Secondary | ICD-10-CM | POA: Insufficient documentation

## 2018-09-30 DIAGNOSIS — Z90710 Acquired absence of both cervix and uterus: Secondary | ICD-10-CM | POA: Insufficient documentation

## 2018-09-30 DIAGNOSIS — Z8371 Family history of colonic polyps: Secondary | ICD-10-CM | POA: Insufficient documentation

## 2018-09-30 DIAGNOSIS — Z8489 Family history of other specified conditions: Secondary | ICD-10-CM | POA: Insufficient documentation

## 2018-09-30 DIAGNOSIS — Z1211 Encounter for screening for malignant neoplasm of colon: Secondary | ICD-10-CM | POA: Insufficient documentation

## 2018-09-30 DIAGNOSIS — Z87891 Personal history of nicotine dependence: Secondary | ICD-10-CM | POA: Insufficient documentation

## 2018-09-30 HISTORY — DX: Gastro-esophageal reflux disease without esophagitis: K21.9

## 2018-09-30 HISTORY — PX: COLONOSCOPY WITH PROPOFOL: SHX5780

## 2018-09-30 HISTORY — DX: Sleep apnea, unspecified: G47.30

## 2018-09-30 HISTORY — DX: Unspecified osteoarthritis, unspecified site: M19.90

## 2018-09-30 LAB — GLUCOSE, CAPILLARY: Glucose-Capillary: 99 mg/dL (ref 70–99)

## 2018-09-30 SURGERY — COLONOSCOPY WITH PROPOFOL
Anesthesia: General

## 2018-09-30 MED ORDER — PROPOFOL 10 MG/ML IV BOLUS
INTRAVENOUS | Status: DC | PRN
  Administered 2018-09-30: 100 mg via INTRAVENOUS

## 2018-09-30 MED ORDER — LIDOCAINE HCL (CARDIAC) PF 100 MG/5ML IV SOSY
PREFILLED_SYRINGE | INTRAVENOUS | Status: DC | PRN
  Administered 2018-09-30: 40 mg via INTRAVENOUS

## 2018-09-30 MED ORDER — MIDAZOLAM HCL 2 MG/2ML IJ SOLN
INTRAMUSCULAR | Status: AC
  Filled 2018-09-30: qty 2

## 2018-09-30 MED ORDER — ONDANSETRON HCL 4 MG/2ML IJ SOLN: 4.0000 mg | Freq: Once | INTRAMUSCULAR | Status: DC | PRN

## 2018-09-30 MED ORDER — LIDOCAINE HCL URETHRAL/MUCOSAL 2 % EX GEL
CUTANEOUS | Status: AC
  Filled 2018-09-30: qty 5

## 2018-09-30 MED ORDER — PROPOFOL 500 MG/50ML IV EMUL
INTRAVENOUS | Status: DC | PRN
  Administered 2018-09-30: 125 ug/kg/min via INTRAVENOUS

## 2018-09-30 MED ORDER — SODIUM CHLORIDE 0.9 % IJ SOLN
INTRAMUSCULAR | Status: DC | PRN
  Administered 2018-09-30: 4 mL

## 2018-09-30 MED ORDER — FENTANYL CITRATE (PF) 100 MCG/2ML IJ SOLN: 25.0000 ug | INTRAMUSCULAR | Status: DC | PRN

## 2018-09-30 MED ORDER — LABETALOL HCL 5 MG/ML IV SOLN
INTRAVENOUS | Status: DC | PRN
  Administered 2018-09-30 (×2): 5 mg via INTRAVENOUS

## 2018-09-30 MED ORDER — IPRATROPIUM-ALBUTEROL 0.5-2.5 (3) MG/3ML IN SOLN: 3.0000 mL | Freq: Four times a day (QID) | RESPIRATORY_TRACT | Status: DC | PRN

## 2018-09-30 MED ORDER — LIDOCAINE HCL (PF) 2 % IJ SOLN
INTRAMUSCULAR | Status: AC
  Filled 2018-09-30: qty 10

## 2018-09-30 MED ORDER — PROPOFOL 500 MG/50ML IV EMUL
INTRAVENOUS | Status: AC
  Filled 2018-09-30: qty 50

## 2018-09-30 MED ORDER — IPRATROPIUM-ALBUTEROL 0.5-2.5 (3) MG/3ML IN SOLN
RESPIRATORY_TRACT | Status: AC
  Administered 2018-09-30: 3 mL via RESPIRATORY_TRACT
  Filled 2018-09-30: qty 3

## 2018-09-30 MED ORDER — PROPOFOL 10 MG/ML IV BOLUS
INTRAVENOUS | Status: AC
  Filled 2018-09-30: qty 20

## 2018-09-30 MED ORDER — LABETALOL HCL 5 MG/ML IV SOLN
INTRAVENOUS | Status: AC
  Filled 2018-09-30: qty 4

## 2018-09-30 MED ORDER — SODIUM CHLORIDE 0.9 % IV SOLN
INTRAVENOUS | Status: DC
  Administered 2018-09-30: 08:00:00 via INTRAVENOUS

## 2018-09-30 NOTE — Anesthesia Post-op Follow-up Note (Signed)
Anesthesia QCDR form completed.        

## 2018-09-30 NOTE — Anesthesia Preprocedure Evaluation (Signed)
Anesthesia Evaluation  Patient identified by MRN, date of birth, ID band Patient awake    Reviewed: Allergy & Precautions, NPO status , Patient's Chart, lab work & pertinent test results  Airway Mallampati: III       Dental   Pulmonary former smoker,    Pulmonary exam normal        Cardiovascular hypertension, Pt. on medications Normal cardiovascular exam     Neuro/Psych  Neuromuscular disease    GI/Hepatic Neg liver ROS,   Endo/Other  diabetes, Well Controlled, Type 2, Oral Hypoglycemic Agents  Renal/GU negative Renal ROS  negative genitourinary   Musculoskeletal  (+) Arthritis , Osteoarthritis,    Abdominal Normal abdominal exam  (+)   Peds negative pediatric ROS (+)  Hematology negative hematology ROS (+)   Anesthesia Other Findings   Reproductive/Obstetrics                             Anesthesia Physical Anesthesia Plan  ASA: II  Anesthesia Plan: General   Post-op Pain Management:    Induction: Intravenous  PONV Risk Score and Plan: Propofol infusion  Airway Management Planned: Nasal Cannula  Additional Equipment:   Intra-op Plan:   Post-operative Plan:   Informed Consent: I have reviewed the patients History and Physical, chart, labs and discussed the procedure including the risks, benefits and alternatives for the proposed anesthesia with the patient or authorized representative who has indicated his/her understanding and acceptance.   Dental advisory given  Plan Discussed with: CRNA and Surgeon  Anesthesia Plan Comments:         Anesthesia Quick Evaluation

## 2018-09-30 NOTE — Op Note (Signed)
[facility] Gastroenterology Patient Name: Temima Kutsch Procedure Date: 09/30/2018 7:40 AM MRN: [id number] Account #: [id number] Date of Birth: 09/06/1946 Admit Type: Outpatient Age: 72 Room: [facility] ENDO ROOM 2 Gender: Female Note Status: Finalized Procedure:            Colonoscopy Indications:          High risk colon cancer surveillance: Personal history                        of colonic polyps Providers:            Benay Pike. Alice Reichert MD, MD Referring MD:         Lynnell Jude (Referring MD) Medicines:            Propofol per Anesthesia Complications:        No immediate complications. Procedure:            Pre-Anesthesia Assessment:                       - The risks and benefits of the procedure and the                        sedation options and risks were discussed with the                        patient. All questions were answered and informed                        consent was obtained.                       - Patient identification and proposed procedure were                        verified prior to the procedure by the nurse. The                        procedure was verified in the procedure room.                       - ASA Grade Assessment: III - A patient with severe                        systemic disease.                       - After reviewing the risks and benefits, the patient                        was deemed in satisfactory condition to undergo the                        procedure.                       After obtaining informed consent, the colonoscope was                        passed under direct vision. Throughout the procedure,  the patient's blood pressure, pulse, and oxygen                        saturations were monitored continuously. The                        Colonoscope was introduced through the anus and                        advanced to the the cecum, identified by appendiceal                         orifice and ileocecal valve. The colonoscopy was                        performed without difficulty. The patient tolerated the                        procedure well. The quality of the bowel preparation                        was fair. The ileocecal valve, appendiceal orifice, and                        rectum were photographed. Findings:      The perianal and digital rectal examinations were normal. Pertinent       negatives include normal sphincter tone and no palpable rectal lesions.      A 5 mm polyp was found in the transverse colon. The polyp was sessile.       The polyp was removed with a cold snare. Resection and retrieval were       complete.      A 11 mm polyp was found in the cecum. The polyp was carpet-like and       sessile. The polyp was removed with a saline injection-lift technique       using a hot snare at 20 watts. Resection and retrieval were complete       using a suction (via the working channel). One hemostatic clip was       successfully placed (MR conditional).      Five sessile polyps were found in the ascending colon. The polyps were 3       to 8 mm in size. These polyps were removed with a hot snare. Resection       and retrieval were complete.      Internal hemorrhoids were found during retroflexion. The hemorrhoids       were Grade I (internal hemorrhoids that do not prolapse).      The exam was otherwise without abnormality. Impression:           - Preparation of the colon was fair.                       - One 5 mm polyp in the transverse colon, removed with                        a cold snare. Resected and retrieved.                       - One 11 mm polyp in the cecum, removed using  injection-lift and a hot snare. Resected and retrieved.                        Clip (MR conditional) was placed.                       - Five 3 to 8 mm polyps in the ascending colon, removed                        with a hot snare. Resected and  retrieved.                       - Internal hemorrhoids.                       - The examination was otherwise normal. Recommendation:       - Patient has a contact number available for                        emergencies. The signs and symptoms of potential                        delayed complications were discussed with the patient.                        Return to normal activities tomorrow. Written discharge                        instructions were provided to the patient.                       - Resume previous diet.                       - Continue present medications.                       - Await pathology results.                       - Repeat colonoscopy date to be determined after                        pending pathology results are reviewed for surveillance.                       - Return to GI clinic PRN.                       - The findings and recommendations were discussed with                        the patient and their family. Procedure Code(s):    --- Professional ---                       [phone], Colonoscopy, flexible; with removal of tumor(s),                        polyp(s), or other lesion(s) by snare technique  45381, Colonoscopy, flexible; with directed submucosal                        injection(s), any substance Diagnosis Code(s):    --- Professional ---                       D12.2, Benign neoplasm of ascending colon                       D12.0, Benign neoplasm of cecum                       D12.3, Benign neoplasm of transverse colon (hepatic                        flexure or splenic flexure)                       K64.0, First degree hemorrhoids                       Z86.010, Personal history of colonic polyps CPT copyright 2018 American Medical Association. All rights reserved. The codes documented in this report are preliminary and upon coder review may  be revised to meet current compliance requirements. Efrain Sella MD,  MD 09/30/2018 9:10:42 AM This report has been signed electronically. Number of Addenda: 0 Note Initiated On: 09/30/2018 7:40 AM Scope Withdrawal Time: 0 hours 28 minutes 13 seconds  Total Procedure Duration: 0 hours 32 minutes 39 seconds       [facility]

## 2018-09-30 NOTE — Transfer of Care (Signed)
Immediate Anesthesia Transfer of Care Note  Patient: Pattye Meda  Procedure(s) Performed: COLONOSCOPY WITH PROPOFOL (N/A )  Patient Location: PACU and Endoscopy Unit  Anesthesia Type:General  Level of Consciousness: awake, alert , oriented and patient cooperative  Airway & Oxygen Therapy: Patient Spontanous Breathing and Patient connected to nasal cannula oxygen  Post-op Assessment: Report given to RN, Post -op Vital signs reviewed and stable and Patient moving all extremities  Post vital signs: Reviewed and stable  Last Vitals:  Vitals Value Taken Time  BP    Temp    Pulse 74 09/30/2018  9:12 AM  Resp 14 09/30/2018  9:12 AM  SpO2 93 % 09/30/2018  9:12 AM  Vitals shown include unvalidated device data.  Last Pain:  Vitals:   09/30/18 0741  TempSrc: Oral  PainSc: 0-No pain         Complications: No apparent anesthesia complications

## 2018-09-30 NOTE — Anesthesia Postprocedure Evaluation (Signed)
Anesthesia Post Note  Patient: Kelsey Mcdowell  Procedure(s) Performed: COLONOSCOPY WITH PROPOFOL (N/A )  Patient location during evaluation: Endoscopy Anesthesia Type: General Level of consciousness: awake and alert, oriented and patient cooperative Pain management: satisfactory to patient Vital Signs Assessment: post-procedure vital signs reviewed and stable Respiratory status: spontaneous breathing and respiratory function stable Cardiovascular status: blood pressure returned to baseline and stable Postop Assessment: no headache, no backache, patient able to bend at knees, no apparent nausea or vomiting, adequate PO intake and able to ambulate Anesthetic complications: no     Last Vitals:  Vitals:   09/30/18 0741 09/30/18 0910  BP: (!) 166/82 121/70  Pulse: 99 71  Resp: 16 13  Temp: 36.9 C (!) 36.1 C  SpO2: 96% 96%    Last Pain:  Vitals:   09/30/18 0910  TempSrc: Tympanic  PainSc:                  [name]

## 2018-09-30 NOTE — Interval H&P Note (Signed)
History and Physical Interval Note:  09/30/2018 8:00 AM  Kelsey Mcdowell  has presented today for surgery, with the diagnosis of PRS HX COLON POLYPS  The various methods of treatment have been discussed with the patient and family. After consideration of risks, benefits and other options for treatment, the patient has consented to  Procedure(s): COLONOSCOPY WITH PROPOFOL (N/A) as a surgical intervention .  The patient's history has been reviewed, patient examined, no change in status, stable for surgery.  I have reviewed the patient's chart and labs.  Questions were answered to the patient's satisfaction.     [place], [place]

## 2018-09-30 NOTE — H&P (Signed)
  Outpatient short stay form Pre-procedure 09/30/2018 8:00 AM Kelsey Mcdowell Kelsey Mcdowell, M.D.  Primary Physician: Lavera Guise, M.D.  Reason for visit:  Personal hx of colon polyps  History of present illness: Patient presents for colon polyp surveillance. Patient denies change in bowel habits, rectal bleeding, weight loss or abdominal pain.    No current facility-administered medications for this encounter.   Medications Prior to Admission  Medication Sig Dispense Refill Last Dose  . alendronate (FOSAMAX) 70 MG tablet Take 70 mg by mouth once a week. Take with a full glass of water on an empty stomach.   09/29/2018 at 0800  . Apremilast (OTEZLA PO) Take 30 mg by mouth.   Past Week at Unknown time  . ezetimibe (ZETIA) 10 MG tablet Take 10 mg by mouth daily.   09/29/2018 at 0800  . furosemide (LASIX) 20 MG tablet Take 20 mg by mouth daily.   09/29/2018 at 0800  . gabapentin (NEURONTIN) 300 MG capsule Take 900 mg by mouth 2 (two) times daily.   09/29/2018 at 0800  . insulin NPH Human (HUMULIN N,NOVOLIN N) 100 UNIT/ML injection Inject 40 Units into the skin at bedtime.   09/28/2018 at 1000  . lisinopril (PRINIVIL,ZESTRIL) 40 MG tablet Take 40 mg by mouth daily.   09/30/2018 at 0630  . metFORMIN (GLUCOPHAGE) 500 MG tablet Take 1,000 mg by mouth 2 (two) times daily with a meal.   09/29/2018 at 0800  . ranitidine (ZANTAC) 150 MG tablet Take 150 mg by mouth 2 (two) times daily.   09/29/2018 at 0800  . vitamin B-12 (CYANOCOBALAMIN) 1000 MCG tablet Take 1,000 mcg by mouth daily.   09/29/2018 at 0800  . glimepiride (AMARYL) 4 MG tablet Take 4 mg by mouth daily with breakfast.   Not Taking at Unknown time     Allergies  Allergen Reactions  . Amoxicillin Other (See Comments)    Other Reaction: GI upset, cramps     Past Medical History:  Diagnosis Date  . Arthritis   . Diabetes mellitus without complication ([place])   . GERD (gastroesophageal reflux disease)   . Hyperlipemia   . Hypertension   . Neuropathy    . Sleep apnea     Review of systems:  Otherwise negative.    Physical Exam  Gen: Alert, oriented. Appears stated age.  HEENT: /AT. PERRLA. Lungs: CTA, no wheezes. CV: RR nl S1, S2. Abd: soft, benign, no masses. BS+ Ext: No edema. Pulses 2+    Planned procedures: Proceed with colonoscopy. The patient understands the nature of the planned procedure, indications, risks, alternatives and potential complications including but not limited to bleeding, infection, perforation, damage to internal organs and possible oversedation/side effects from anesthesia. The patient agrees and gives consent to proceed.  Please refer to procedure notes for findings, recommendations and patient disposition/instructions.     Kelsey Mcdowell Kelsey Mcdowell, M.D. Gastroenterology 09/30/2018  8:00 AM

## 2018-10-02 ENCOUNTER — Encounter: Payer: Self-pay | Admitting: Internal Medicine

## 2018-10-02 LAB — SURGICAL PATHOLOGY

## 2018-10-06 DIAGNOSIS — Z6835 Body mass index (BMI) 35.0-35.9, adult: Secondary | ICD-10-CM | POA: Diagnosis not present

## 2018-10-06 DIAGNOSIS — H81313 Aural vertigo, bilateral: Secondary | ICD-10-CM | POA: Diagnosis not present

## 2018-10-29 DIAGNOSIS — E1140 Type 2 diabetes mellitus with diabetic neuropathy, unspecified: Secondary | ICD-10-CM | POA: Diagnosis not present

## 2018-10-29 DIAGNOSIS — Z23 Encounter for immunization: Secondary | ICD-10-CM | POA: Diagnosis not present

## 2018-11-05 DIAGNOSIS — E1140 Type 2 diabetes mellitus with diabetic neuropathy, unspecified: Secondary | ICD-10-CM | POA: Diagnosis not present

## 2018-11-05 DIAGNOSIS — M109 Gout, unspecified: Secondary | ICD-10-CM | POA: Diagnosis not present

## 2018-11-05 DIAGNOSIS — Z6833 Body mass index (BMI) 33.0-33.9, adult: Secondary | ICD-10-CM | POA: Diagnosis not present

## 2018-11-05 DIAGNOSIS — E1165 Type 2 diabetes mellitus with hyperglycemia: Secondary | ICD-10-CM | POA: Diagnosis not present

## 2018-11-05 DIAGNOSIS — F419 Anxiety disorder, unspecified: Secondary | ICD-10-CM | POA: Diagnosis not present

## 2019-02-05 DIAGNOSIS — J449 Chronic obstructive pulmonary disease, unspecified: Secondary | ICD-10-CM | POA: Diagnosis not present

## 2019-02-05 DIAGNOSIS — R0902 Hypoxemia: Secondary | ICD-10-CM | POA: Diagnosis not present

## 2019-02-05 DIAGNOSIS — Z79899 Other long term (current) drug therapy: Secondary | ICD-10-CM | POA: Diagnosis not present

## 2019-02-05 DIAGNOSIS — Z6833 Body mass index (BMI) 33.0-33.9, adult: Secondary | ICD-10-CM | POA: Diagnosis not present

## 2019-02-05 DIAGNOSIS — G4733 Obstructive sleep apnea (adult) (pediatric): Secondary | ICD-10-CM | POA: Diagnosis not present

## 2019-02-05 DIAGNOSIS — R0602 Shortness of breath: Secondary | ICD-10-CM | POA: Diagnosis not present

## 2019-02-05 DIAGNOSIS — E1140 Type 2 diabetes mellitus with diabetic neuropathy, unspecified: Secondary | ICD-10-CM | POA: Diagnosis not present

## 2019-02-08 ENCOUNTER — Telehealth: Payer: Self-pay | Admitting: *Deleted

## 2019-02-08 NOTE — Telephone Encounter (Signed)
Received referral for low dose lung cancer screening CT scan. Message left at phone number listed in EMR for patient to call me back to facilitate scheduling scan.  

## 2019-02-08 NOTE — Telephone Encounter (Signed)
Received referral for initial lung cancer screening scan. Contacted patient and obtained smoking history, which includes a quit date > 15 years. Patient verbalizes understanding that she is not eligible for lung cancer screening due to the quit date.

## 2019-02-25 DIAGNOSIS — R0602 Shortness of breath: Secondary | ICD-10-CM | POA: Diagnosis not present

## 2019-02-25 DIAGNOSIS — G4733 Obstructive sleep apnea (adult) (pediatric): Secondary | ICD-10-CM | POA: Diagnosis not present

## 2019-03-06 DIAGNOSIS — R0602 Shortness of breath: Secondary | ICD-10-CM | POA: Diagnosis not present

## 2019-04-06 DIAGNOSIS — R0602 Shortness of breath: Secondary | ICD-10-CM | POA: Diagnosis not present

## 2019-05-06 DIAGNOSIS — R0602 Shortness of breath: Secondary | ICD-10-CM | POA: Diagnosis not present

## 2019-06-06 DIAGNOSIS — R0602 Shortness of breath: Secondary | ICD-10-CM | POA: Diagnosis not present

## 2019-06-28 DIAGNOSIS — E1165 Type 2 diabetes mellitus with hyperglycemia: Secondary | ICD-10-CM | POA: Diagnosis not present

## 2019-06-28 DIAGNOSIS — Z1331 Encounter for screening for depression: Secondary | ICD-10-CM | POA: Diagnosis not present

## 2019-06-28 DIAGNOSIS — Z6832 Body mass index (BMI) 32.0-32.9, adult: Secondary | ICD-10-CM | POA: Diagnosis not present

## 2019-06-28 DIAGNOSIS — E1140 Type 2 diabetes mellitus with diabetic neuropathy, unspecified: Secondary | ICD-10-CM | POA: Diagnosis not present

## 2019-06-28 DIAGNOSIS — I10 Essential (primary) hypertension: Secondary | ICD-10-CM | POA: Diagnosis not present

## 2019-06-28 DIAGNOSIS — F4322 Adjustment disorder with anxiety: Secondary | ICD-10-CM | POA: Diagnosis not present

## 2019-06-28 DIAGNOSIS — E782 Mixed hyperlipidemia: Secondary | ICD-10-CM | POA: Diagnosis not present

## 2019-06-28 DIAGNOSIS — Z1389 Encounter for screening for other disorder: Secondary | ICD-10-CM | POA: Diagnosis not present

## 2019-06-28 DIAGNOSIS — G4730 Sleep apnea, unspecified: Secondary | ICD-10-CM | POA: Diagnosis not present

## 2019-07-06 DIAGNOSIS — R0602 Shortness of breath: Secondary | ICD-10-CM | POA: Diagnosis not present

## 2019-08-06 DIAGNOSIS — R0602 Shortness of breath: Secondary | ICD-10-CM | POA: Diagnosis not present

## 2019-09-06 DIAGNOSIS — R0602 Shortness of breath: Secondary | ICD-10-CM | POA: Diagnosis not present

## 2019-09-28 DIAGNOSIS — Z6833 Body mass index (BMI) 33.0-33.9, adult: Secondary | ICD-10-CM | POA: Diagnosis not present

## 2019-09-28 DIAGNOSIS — Z23 Encounter for immunization: Secondary | ICD-10-CM | POA: Diagnosis not present

## 2019-09-28 DIAGNOSIS — Z636 Dependent relative needing care at home: Secondary | ICD-10-CM | POA: Diagnosis not present

## 2019-09-28 DIAGNOSIS — E1140 Type 2 diabetes mellitus with diabetic neuropathy, unspecified: Secondary | ICD-10-CM | POA: Diagnosis not present

## 2019-09-28 DIAGNOSIS — F4322 Adjustment disorder with anxiety: Secondary | ICD-10-CM | POA: Diagnosis not present

## 2019-10-06 DIAGNOSIS — R0602 Shortness of breath: Secondary | ICD-10-CM | POA: Diagnosis not present

## 2019-11-08 DIAGNOSIS — R0602 Shortness of breath: Secondary | ICD-10-CM | POA: Diagnosis not present

## 2019-11-16 ENCOUNTER — Other Ambulatory Visit: Payer: Self-pay | Admitting: Family Medicine

## 2019-11-16 DIAGNOSIS — Z1231 Encounter for screening mammogram for malignant neoplasm of breast: Secondary | ICD-10-CM

## 2019-11-25 ENCOUNTER — Ambulatory Visit
Admission: RE | Admit: 2019-11-25 | Discharge: 2019-11-25 | Disposition: A | Discharge status: Home / Self Care | Payer: PPO | Source: Ambulatory Visit | Attending: Family Medicine | Admitting: Family Medicine

## 2019-11-25 DIAGNOSIS — Z1231 Encounter for screening mammogram for malignant neoplasm of breast: Secondary | ICD-10-CM | POA: Diagnosis not present

## 2019-11-29 DIAGNOSIS — L918 Other hypertrophic disorders of the skin: Secondary | ICD-10-CM | POA: Diagnosis not present

## 2019-11-29 DIAGNOSIS — Z79899 Other long term (current) drug therapy: Secondary | ICD-10-CM | POA: Diagnosis not present

## 2019-11-29 DIAGNOSIS — L821 Other seborrheic keratosis: Secondary | ICD-10-CM | POA: Diagnosis not present

## 2019-11-29 DIAGNOSIS — L400 Psoriasis vulgaris: Secondary | ICD-10-CM | POA: Diagnosis not present

## 2019-12-06 DIAGNOSIS — R0602 Shortness of breath: Secondary | ICD-10-CM | POA: Diagnosis not present

## 2020-08-15 ENCOUNTER — Other Ambulatory Visit: Payer: Self-pay | Admitting: Family Medicine

## 2020-08-15 DIAGNOSIS — M7989 Other specified soft tissue disorders: Secondary | ICD-10-CM

## 2020-08-15 DIAGNOSIS — M79605 Pain in left leg: Secondary | ICD-10-CM

## 2020-08-16 ENCOUNTER — Other Ambulatory Visit: Payer: Self-pay

## 2020-08-16 ENCOUNTER — Ambulatory Visit
Admission: RE | Admit: 2020-08-16 | Discharge: 2020-08-16 | Disposition: A | Discharge status: Home / Self Care | Payer: Medicare HMO | Source: Ambulatory Visit | Attending: Family Medicine | Admitting: Family Medicine

## 2020-08-16 DIAGNOSIS — M7989 Other specified soft tissue disorders: Secondary | ICD-10-CM | POA: Insufficient documentation

## 2020-08-16 DIAGNOSIS — M79605 Pain in left leg: Secondary | ICD-10-CM

## 2021-01-28 ENCOUNTER — Emergency Department: Payer: Medicare HMO

## 2021-01-28 ENCOUNTER — Other Ambulatory Visit: Payer: Self-pay

## 2021-01-28 ENCOUNTER — Emergency Department
Admission: EM | Admit: 2021-01-28 | Discharge: 2021-01-28 | Disposition: A | Discharge status: Home / Self Care | Payer: Medicare HMO | Attending: Emergency Medicine | Admitting: Emergency Medicine

## 2021-01-28 DIAGNOSIS — Z794 Long term (current) use of insulin: Secondary | ICD-10-CM | POA: Insufficient documentation

## 2021-01-28 DIAGNOSIS — R0789 Other chest pain: Secondary | ICD-10-CM | POA: Insufficient documentation

## 2021-01-28 DIAGNOSIS — E785 Hyperlipidemia, unspecified: Secondary | ICD-10-CM | POA: Insufficient documentation

## 2021-01-28 DIAGNOSIS — Z79899 Other long term (current) drug therapy: Secondary | ICD-10-CM | POA: Insufficient documentation

## 2021-01-28 DIAGNOSIS — R079 Chest pain, unspecified: Secondary | ICD-10-CM

## 2021-01-28 DIAGNOSIS — E1140 Type 2 diabetes mellitus with diabetic neuropathy, unspecified: Secondary | ICD-10-CM | POA: Insufficient documentation

## 2021-01-28 DIAGNOSIS — I10 Essential (primary) hypertension: Secondary | ICD-10-CM | POA: Insufficient documentation

## 2021-01-28 DIAGNOSIS — E1169 Type 2 diabetes mellitus with other specified complication: Secondary | ICD-10-CM | POA: Insufficient documentation

## 2021-01-28 DIAGNOSIS — Z7984 Long term (current) use of oral hypoglycemic drugs: Secondary | ICD-10-CM | POA: Insufficient documentation

## 2021-01-28 DIAGNOSIS — Z87891 Personal history of nicotine dependence: Secondary | ICD-10-CM | POA: Insufficient documentation

## 2021-01-28 LAB — BASIC METABOLIC PANEL
Anion gap: 14 (ref 5–15)
BUN: 12 mg/dL (ref 8–23)
CO2: 30 mmol/L (ref 22–32)
Calcium: 8.6 mg/dL — ABNORMAL LOW (ref 8.9–10.3)
Chloride: 97 mmol/L — ABNORMAL LOW (ref 98–111)
Creatinine, Ser: 1.04 mg/dL — ABNORMAL HIGH (ref 0.44–1.00)
GFR, Estimated: 56 mL/min — ABNORMAL LOW (ref >60)
Glucose, Bld: 123 mg/dL — ABNORMAL HIGH (ref 70–99)
Potassium: 2.5 mmol/L — CL (ref 3.5–5.1)
Sodium: 141 mmol/L (ref 135–145)

## 2021-01-28 LAB — CBC
HCT: 38.4 % (ref 36.0–46.0)
Hemoglobin: 12.3 g/dL (ref 12.0–15.0)
MCH: 29.2 pg (ref 26.0–34.0)
MCHC: 32 g/dL (ref 30.0–36.0)
MCV: 91.2 fL (ref 80.0–100.0)
Platelets: 249 10*3/uL (ref 150–400)
RBC: 4.21 MIL/uL (ref 3.87–5.11)
RDW: 15.6 % — ABNORMAL HIGH (ref 11.5–15.5)
WBC: 7.9 10*3/uL (ref 4.0–10.5)
nRBC: 0 % (ref 0.0–0.2)

## 2021-01-28 LAB — TROPONIN I (HIGH SENSITIVITY): Troponin I (High Sensitivity): 9 ng/L (ref <18)

## 2021-01-28 MED ORDER — POTASSIUM CHLORIDE 20 MEQ PO PACK
40.0000 meq | PACK | Freq: Every day | ORAL | Status: DC
  Administered 2021-01-28: 40 meq via ORAL
  Filled 2021-01-28: qty 2

## 2021-01-28 MED ORDER — POTASSIUM CHLORIDE IN NACL 40-0.9 MEQ/L-% IV SOLN
INTRAVENOUS | Status: AC
  Filled 2021-01-28: qty 1000

## 2021-01-28 NOTE — ED Triage Notes (Signed)
Pt arrives via EMS from home with CP that started sometime after 0930 this morning- pt's husband's funeral was yesterday- pt states it feels like a pressure in the center of her chest accompanied by [facility]

## 2021-01-29 NOTE — ED Provider Notes (Signed)
[facility] Emergency Department Provider Note   ____________________________________________   Event Date/Time   First MD Initiated Contact with Patient 01/28/21 1255     (approximate)  I have reviewed the triage vital signs and the nursing notes.   HISTORY  Chief Complaint Chest Pain    HPI Kelsey Mcdowell is a 75 y.o. female with a stated past medical history of paroxysmal atrial fibrillation, type 2 diabetes, hypertension, and hyperlipidemia who presents from home for chest pain that started approximately 0930 this morning and has been stable since onset.  Patient describes a heaviness in her central chest that does not radiate and has no exertional component.  Patient denies any specific exacerbating or relieving factors.  Patient does endorse associated shortness of breath but this has no exertional component either.  Patient does note that the memorial for her husband's funeral was yesterday and she has been under increased stress.  Patient currently denies any vision changes, tinnitus, difficulty speaking, facial droop, sore throat, abdominal pain, nausea/vomiting/diarrhea, dysuria, or weakness/numbness/paresthesias in any extremity         Past Medical History:  Diagnosis Date  . Arthritis   . Diabetes mellitus without complication ([place])   . GERD (gastroesophageal reflux disease)   . Hyperlipemia   . Hypertension   . Neuropathy   . Sleep apnea     Patient Active Problem List   Diagnosis Date Noted  . Lumbar spondylosis 08/05/2017  . Lumbar degenerative disc disease 08/05/2017  . Lumbar radiculopathy 08/05/2017  . S/P cervical spinal fusion 08/05/2017  . Neuropathy 08/05/2017  . Right leg pain 08/05/2017    Past Surgical History:  Procedure Laterality Date  . ABDOMINAL HYSTERECTOMY    . APPENDECTOMY    . BREAST EXCISIONAL BIOPSY Left 1970's   benign  . CHOLECYSTECTOMY    . COLONOSCOPY WITH PROPOFOL N/A 09/30/2018    Procedure: COLONOSCOPY WITH PROPOFOL;  Surgeon: Toledo, Benay Pike, MD;  Location: [facility] ENDOSCOPY;  Service: Gastroenterology;  Laterality: N/A;  . SPINE SURGERY  1998   cervical fusion    Prior to Admission medications   Medication Sig Start Date End Date Taking? Authorizing Provider  alendronate (FOSAMAX) 70 MG tablet Take 70 mg by mouth once a week. Take with a full glass of water on an empty stomach.    [provider]  Apremilast (OTEZLA PO) Take 30 mg by mouth.    [provider]  ezetimibe (ZETIA) 10 MG tablet Take 10 mg by mouth daily.    [provider]  furosemide (LASIX) 20 MG tablet Take 20 mg by mouth daily.    [provider]  gabapentin (NEURONTIN) 300 MG capsule Take 900 mg by mouth 2 (two) times daily.    [provider]  glimepiride (AMARYL) 4 MG tablet Take 4 mg by mouth daily with breakfast.    [provider]  insulin NPH Human (HUMULIN N,NOVOLIN N) 100 UNIT/ML injection Inject 40 Units into the skin at bedtime.    [provider]  lisinopril (PRINIVIL,ZESTRIL) 40 MG tablet Take 40 mg by mouth daily.    [provider]  metFORMIN (GLUCOPHAGE) 500 MG tablet Take 1,000 mg by mouth 2 (two) times daily with a meal.    [provider]  ranitidine (ZANTAC) 150 MG tablet Take 150 mg by mouth 2 (two) times daily.    [provider]  vitamin B-12 (CYANOCOBALAMIN) 1000 MCG tablet Take 1,000 mcg by mouth daily.    [provider]    Allergies Amoxicillin  Family History  Problem Relation Age of Onset  . Cancer Mother   . Hypertension Mother   . Diabetes Mother   . Heart disease Father   . Diabetes Father   . Cancer Sister   . Breast cancer Sister 83  . Heart disease Brother     Social History Social History   Tobacco Use  . Smoking status: Former Research scientist (life sciences)  . Smokeless tobacco: Never Used  Vaping Use  . Vaping Use: Never used  Substance Use Topics  . Alcohol use: Yes     Comment: occasional  . Drug use: No    Review of Systems Constitutional: No fever/chills Eyes: No visual changes. ENT: No sore throat. Cardiovascular: Endorses chest pain. Respiratory: Endorses shortness of breath. Gastrointestinal: No abdominal pain.  No nausea, no vomiting.  No diarrhea. Genitourinary: Negative for dysuria. Musculoskeletal: Negative for acute arthralgias Skin: Negative for rash. Neurological: Negative for headaches, weakness/numbness/paresthesias in any extremity Psychiatric: Negative for suicidal ideation/homicidal ideation   ____________________________________________   PHYSICAL EXAM:  VITAL SIGNS: ED Triage Vitals  Enc Vitals Group     BP 01/28/21 1140 (!) 154/85     Pulse Rate 01/28/21 1140 80     Resp 01/28/21 1140 20     Temp 01/28/21 1140 97.6 F (36.4 C)     Temp Source 01/28/21 1140 Oral     SpO2 01/28/21 1140 100 %     Weight 01/28/21 1137 168 lb (76.2 kg)     Height 01/28/21 1137 5\' 1"  (1.549 m)     Head Circumference --      Peak Flow --      Pain Score 01/28/21 1137 4     Pain Loc --      Pain Edu? --      Excl. in [place]? --    Constitutional: Alert and oriented. Well appearing and in no acute distress. Eyes: Conjunctivae are normal. PERRL. Head: Atraumatic. Nose: No congestion/rhinnorhea. Mouth/Throat: Mucous membranes are moist. Neck: No stridor Cardiovascular: Grossly normal heart sounds.  Good peripheral circulation. Respiratory: Normal respiratory effort.  No retractions. Gastrointestinal: Soft and nontender. No distention. Musculoskeletal: No obvious deformities Neurologic:  Normal speech and language. No gross focal neurologic deficits are appreciated. Skin:  Skin is warm and dry. No rash noted. Psychiatric: Mood is labile and affect are normal. Speech and behavior are normal.  ____________________________________________   LABS (all labs ordered are listed, but only abnormal results are displayed)  Labs Reviewed  CBC  - Abnormal; Notable for the following components:      Result Value   RDW 15.6 (*)    All other components within normal limits  BASIC METABOLIC PANEL - Abnormal; Notable for the following components:   Potassium 2.5 (*)    Chloride 97 (*)    Glucose, Bld 123 (*)    Creatinine, Ser 1.04 (*)    Calcium 8.6 (*)    GFR, Estimated 56 (*)    All other components within normal limits  TROPONIN I (HIGH SENSITIVITY)   ____________________________________________  EKG  ED ECG REPORT I, Naaman Plummer, the attending physician, personally viewed and interpreted this ECG.  Date: 01/29/2021 EKG Time: 1120 Rate: 76 Rhythm: Atrial fibrillation QRS Axis: normal Intervals: normal ST/T Wave abnormalities: normal Narrative Interpretation: no evidence of acute ischemia  ____________________________________________  RADIOLOGY  ED MD interpretation: 2 view x-ray of the chest shows no evidence of acute abnormalities including no pneumonia, pneumothorax, or  widened mediastinum  Official radiology report(s): No results found.  ____________________________________________   PROCEDURES  Procedure(s) performed (including Critical Care):  Procedures   ____________________________________________   INITIAL IMPRESSION / ASSESSMENT AND PLAN / ED COURSE  As part of my medical decision making, I reviewed the following data within the [place] notes reviewed and incorporated, Labs reviewed, EKG interpreted, Old chart reviewed, Radiograph reviewed and Notes from prior ED visits reviewed and incorporated        Workup: ECG, CXR, CBC, BMP, Troponin Findings: ECG: No overt evidence of STEMI. No evidence of Brugadas sign, delta wave, epsilon wave, significantly prolonged QTc, or malignant arrhythmia HS Troponin: Negative x1 Other Labs unremarkable for emergent problems. CXR: Without PTX, PNA, or widened mediastinum Last Stress Test: 2 years prior to arrival Last  Heart Catheterization: 5 years prior to arrival HEART Score: 3  Given History, Exam, and Workup I have low suspicion for ACS, Pneumothorax, Pneumonia, Pulmonary Embolus, Tamponade, Aortic Dissection or other emergent problem as a cause for this presentation.   Reassesment: Prior to discharge patients pain was controlled and they were well appearing. Patient's potassium repleted via IV and oral.  No EKG changes or concerns for symptomatic hypokalemia Disposition:  Discharge. Strict return precautions discussed with patient with full understanding. Advised patient to follow up promptly with primary care provider      ____________________________________________   FINAL CLINICAL IMPRESSION(S) / ED DIAGNOSES  Final diagnoses:  Chest pain, unspecified type     ED Discharge Orders    None       Note:  This document was prepared using Dragon voice recognition software and may include unintentional dictation errors.   Naaman Plummer, MD 01/29/21 [phone]

## 2021-11-12 ENCOUNTER — Emergency Department: Payer: Medicare Other

## 2021-11-12 ENCOUNTER — Emergency Department
Admission: EM | Admit: 2021-11-12 | Discharge: 2021-11-13 | Disposition: A | Discharge status: Home / Self Care | Payer: Medicare Other | Attending: Student in an Organized Health Care Education/Training Program | Admitting: Student in an Organized Health Care Education/Training Program

## 2021-11-12 ENCOUNTER — Other Ambulatory Visit: Payer: Self-pay

## 2021-11-12 DIAGNOSIS — Z87891 Personal history of nicotine dependence: Secondary | ICD-10-CM | POA: Diagnosis not present

## 2021-11-12 DIAGNOSIS — Z794 Long term (current) use of insulin: Secondary | ICD-10-CM | POA: Diagnosis not present

## 2021-11-12 DIAGNOSIS — W19XXXA Unspecified fall, initial encounter: Secondary | ICD-10-CM | POA: Diagnosis not present

## 2021-11-12 DIAGNOSIS — S0083XA Contusion of other part of head, initial encounter: Secondary | ICD-10-CM | POA: Diagnosis not present

## 2021-11-12 DIAGNOSIS — S0993XA Unspecified injury of face, initial encounter: Secondary | ICD-10-CM | POA: Diagnosis present

## 2021-11-12 DIAGNOSIS — M25511 Pain in right shoulder: Secondary | ICD-10-CM | POA: Diagnosis not present

## 2021-11-12 DIAGNOSIS — I10 Essential (primary) hypertension: Secondary | ICD-10-CM | POA: Diagnosis not present

## 2021-11-12 DIAGNOSIS — Z79899 Other long term (current) drug therapy: Secondary | ICD-10-CM | POA: Insufficient documentation

## 2021-11-12 DIAGNOSIS — E119 Type 2 diabetes mellitus without complications: Secondary | ICD-10-CM | POA: Insufficient documentation

## 2021-11-12 DIAGNOSIS — M79601 Pain in right arm: Secondary | ICD-10-CM | POA: Insufficient documentation

## 2021-11-12 DIAGNOSIS — Z7984 Long term (current) use of oral hypoglycemic drugs: Secondary | ICD-10-CM | POA: Insufficient documentation

## 2021-11-12 NOTE — ED Triage Notes (Signed)
Pt comes into the ED via EMS from home, states she was sitting up on the cough and fell sleep falling forward onto the floor, abrasion to her face and forehead, back pain, BL arm pain worse on the right. Denies LOC , no obvious deformities. 174/88 95%RA HR102

## 2021-11-13 ENCOUNTER — Emergency Department: Payer: Medicare Other

## 2021-11-13 MED ORDER — LIDOCAINE 5 % EX PTCH: 1.0000 | MEDICATED_PATCH | Freq: Two times a day (BID) | CUTANEOUS | 0 refills | Status: DC

## 2021-11-13 MED ORDER — LIDOCAINE 5 % EX PTCH
1.0000 | MEDICATED_PATCH | CUTANEOUS | Status: DC
  Administered 2021-11-13: 1 via TRANSDERMAL
  Filled 2021-11-13 (×2): qty 1

## 2021-11-13 NOTE — ED Provider Notes (Signed)
[facility] Emergency Department Provider Note    Event Date/Time   First MD Initiated Contact with Patient 11/12/21 2336     (approximate)  I have reviewed the triage vital signs and the nursing notes.   HISTORY  Chief Complaint Fall    HPI Kelsey Mcdowell is a 75 y.o. female below listed past medical history presents to the ER for evaluation of facial injury and right shoulder and arm pain that occurred after mechanical fall.  Patient states that she fell asleep in the couch and woke up falling off the bed and struck her face on the floor.  No LOC.  No numbness or tingling.  No chest pain no shortness of breath.  No nausea or vomiting.  Did not take anything for pain prior to arrival.  Past Medical History:  Diagnosis Date   Arthritis    Diabetes mellitus without complication (HCC)    GERD (gastroesophageal reflux disease)    Hyperlipemia    Hypertension    Neuropathy    Sleep apnea    Family History  Problem Relation Age of Onset   Cancer Mother    Hypertension Mother    Diabetes Mother    Heart disease Father    Diabetes Father    Cancer Sister    Breast cancer Sister 57   Heart disease Brother    Past Surgical History:  Procedure Laterality Date   ABDOMINAL HYSTERECTOMY     APPENDECTOMY     BREAST EXCISIONAL BIOPSY Left 1970's   benign   CHOLECYSTECTOMY     COLONOSCOPY WITH PROPOFOL N/A 09/30/2018   Procedure: COLONOSCOPY WITH PROPOFOL;  Surgeon: Toledo, Benay Pike, MD;  Location: [facility] ENDOSCOPY;  Service: Gastroenterology;  Laterality: N/A;   SPINE SURGERY  1998   cervical fusion   Patient Active Problem List   Diagnosis Date Noted   Lumbar spondylosis 08/05/2017   Lumbar degenerative disc disease 08/05/2017   Lumbar radiculopathy 08/05/2017   S/P cervical spinal fusion 08/05/2017   Neuropathy 08/05/2017   Right leg pain 08/05/2017      Prior to Admission medications   Medication Sig Start Date End Date Taking?  Authorizing Provider  alendronate (FOSAMAX) 70 MG tablet Take 70 mg by mouth once a week. Take with a full glass of water on an empty stomach.    [provider]  Apremilast (OTEZLA PO) Take 30 mg by mouth.    [provider]  ezetimibe (ZETIA) 10 MG tablet Take 10 mg by mouth daily.    [provider]  furosemide (LASIX) 20 MG tablet Take 20 mg by mouth daily.    [provider]  gabapentin (NEURONTIN) 300 MG capsule Take 900 mg by mouth 2 (two) times daily.    [provider]  glimepiride (AMARYL) 4 MG tablet Take 4 mg by mouth daily with breakfast.    [provider]  insulin NPH Human (HUMULIN N,NOVOLIN N) 100 UNIT/ML injection Inject 40 Units into the skin at bedtime.    [provider]  lisinopril (PRINIVIL,ZESTRIL) 40 MG tablet Take 40 mg by mouth daily.    [provider]  metFORMIN (GLUCOPHAGE) 500 MG tablet Take 1,000 mg by mouth 2 (two) times daily with a meal.    [provider]  ranitidine (ZANTAC) 150 MG tablet Take 150 mg by mouth 2 (two) times daily.    [provider]  vitamin B-12 (CYANOCOBALAMIN) 1000 MCG tablet Take 1,000 mcg by mouth daily.  [provider]    Allergies Amoxicillin    Social History Social History   Tobacco Use   Smoking status: Former   Smokeless tobacco: Never  Scientific laboratory technician Use: Never used  Substance Use Topics   Alcohol use: Yes    Comment: occasional   Drug use: No    Review of Systems Patient denies headaches, rhinorrhea, blurry vision, numbness, shortness of breath, chest pain, edema, cough, abdominal pain, nausea, vomiting, diarrhea, dysuria, fevers, rashes or hallucinations unless otherwise stated above in HPI. ____________________________________________   PHYSICAL EXAM:  VITAL SIGNS: Vitals:   11/12/21 2252 11/12/21 2352  BP: (!) 178/94 (!) 166/85  Pulse: (!) 106 (!) 101  Resp: (!) 22 18  Temp:    SpO2: 95% 92%     Constitutional: Alert and oriented. Well appearing and in no acute distress. Eyes: Conjunctivae are normal. Eomi, no proptosis Head: contusion and abrasion to left forehead  Nose: No congestion/rhinnorhea. No septal hematoma Mouth/Throat: Mucous membranes are moist.   Neck: Painless ROM. No step offs or deformity Cardiovascular:   Good peripheral circulation. Respiratory: Normal respiratory effort.  No retractions.  Gastrointestinal: Soft and nontender.  Musculoskeletal: No lower extremity tenderness . Pain with palpation of right proximal humerus, no deformity. No joint effusions. Neurologic:  Normal speech and language. No gross focal neurologic deficits are appreciated.  Skin:  Skin is warm, dry and intact. No rash noted. Psychiatric: Mood and affect are normal. Speech and behavior are normal.  ____________________________________________   LABS (all labs ordered are listed, but only abnormal results are displayed)  No results found for this or any previous visit (from the past 24 hour(s)). ____________________________________________ ____________________________________________  ENIDPOEUM  I personally reviewed all radiographic images ordered to evaluate for the above acute complaints and reviewed radiology reports and findings.  These findings were personally discussed with the patient.  Please see medical record for radiology report.  ____________________________________________   PROCEDURES  Procedure(s) performed:  Procedures    Critical Care performed: no ____________________________________________   INITIAL IMPRESSION / ASSESSMENT AND PLAN / ED COURSE  Pertinent labs & imaging results that were available during my care of the patient were reviewed by me and considered in my medical decision making (see chart for details).   DDX: fracture, contusion, dislocation, sdh, iph, concussion  Kelsey Mcdowell is a 75 y.o. who presents to the ED with  fall and injury as described above.  Well-appearing no acute distress.  Imaging ordered for above differential is reassuring without evidence of acute intracranial abnormality fracture or dislocation.  Injuries consistent with contusions and abrasion.  Possible muscle ligamentous strain does appear stable and appropriate for outpatient follow-up.      ____________________________________________   FINAL CLINICAL IMPRESSION(S) / ED DIAGNOSES  Final diagnoses:  Fall, initial encounter  Contusion of face, initial encounter      NEW MEDICATIONS STARTED DURING THIS VISIT:  New Prescriptions   No medications on file     Note:  This document was prepared using Dragon voice recognition software and may include unintentional dictation errors.     Merlyn Lot, MD 11/13/21 [phone]

## 2022-01-05 ENCOUNTER — Ambulatory Visit
Admission: EM | Admit: 2022-01-05 | Discharge: 2022-01-05 | Disposition: A | Discharge status: Home / Self Care | Payer: Medicare Other | Attending: Emergency Medicine | Admitting: Emergency Medicine

## 2022-01-05 ENCOUNTER — Other Ambulatory Visit: Payer: Self-pay

## 2022-01-05 DIAGNOSIS — I872 Venous insufficiency (chronic) (peripheral): Secondary | ICD-10-CM | POA: Diagnosis present

## 2022-01-05 DIAGNOSIS — E119 Type 2 diabetes mellitus without complications: Secondary | ICD-10-CM | POA: Insufficient documentation

## 2022-01-05 DIAGNOSIS — Z7984 Long term (current) use of oral hypoglycemic drugs: Secondary | ICD-10-CM | POA: Diagnosis not present

## 2022-01-05 LAB — GLUCOSE, CAPILLARY: Glucose-Capillary: 108 mg/dL — ABNORMAL HIGH (ref 70–99)

## 2022-01-05 MED ORDER — CEPHALEXIN 500 MG PO CAPS: 1000.0000 mg | ORAL_CAPSULE | Freq: Two times a day (BID) | ORAL | 0 refills | Status: AC

## 2022-01-05 MED ORDER — TRIAMCINOLONE ACETONIDE 0.1 % EX OINT: 1.0000 "application " | TOPICAL_OINTMENT | Freq: Two times a day (BID) | CUTANEOUS | 0 refills | Status: DC

## 2022-01-05 NOTE — Discharge Instructions (Addendum)
Elevation, mild compression stockings that fit, gentle skin cleansing and frequent use of bland emollients such as Vaseline.  You can try the triamcinolone ointment for itching.  Apply it once or twice a day, for 1 to 2 weeks.  Do not apply it for any longer than that.

## 2022-01-05 NOTE — ED Provider Notes (Signed)
HPI  SUBJECTIVE:  Kelsey Mcdowell is a 76 y.o. female who presents with burning, itchy pain, erythema and a bumpy rash on her bilateral lower extremities that is worse in the evening for the past 2 weeks.  She states that she is fine in the morning, when the swelling is gone, but it starts in the afternoon after she has been on her feet.  No fevers, body aches.  Her skin is intact.  She has never had symptoms like this before.  No antipyretic in the past 6 hours.  She has tried cortisone cream, ice, Tylenol 650 3 times daily as needed, [employer] diabetic cream and Vicks salve.  She also tried a pair of her sisters compression stockings.  She is not sure if they were the appropriate size for her.  The cortisone, Tylenol and ice help.  Symptoms are worse at the end of the day, with standing for prolonged periods of time, and with wearing compression stockings.  She states that her glucose has been poorly controlled for "a while", states that has been in the 200s recently.  She has a past medical history of diabetes, takes metformin, insulin Novolin N 18 units twice daily.  She states that she was recently prescribed Novolin R, has not started taking it yet.  She has a history of peripheral neuropathy and is on Neurontin and amitriptyline, hypertension, peripheral edema for which she takes Lasix, and psoriasis.  She has not taken her psoriasis medication in the past 3 weeks.  No history of PAD/PVD, 6 chronic kidney disease, CHF, hypercholesterolemia, coronary artery disease.  She states that she saw her PCP for the symptoms earlier this week, was thought to have diabetic neuropathy, and was prescribed Novolin R.     Past Medical History:  Diagnosis Date   Arthritis    Diabetes mellitus without complication (HCC)    GERD (gastroesophageal reflux disease)    Hyperlipemia    Hypertension    Neuropathy    Sleep apnea     Past Surgical History:  Procedure Laterality Date   ABDOMINAL  HYSTERECTOMY     APPENDECTOMY     BREAST EXCISIONAL BIOPSY Left 1970's   benign   CHOLECYSTECTOMY     COLONOSCOPY WITH PROPOFOL N/A 09/30/2018   Procedure: COLONOSCOPY WITH PROPOFOL;  Surgeon: Toledo, Benay Pike, MD;  Location: [facility] ENDOSCOPY;  Service: Gastroenterology;  Laterality: N/A;   SPINE SURGERY  1998   cervical fusion    Family History  Problem Relation Age of Onset   Cancer Mother    Hypertension Mother    Diabetes Mother    Heart disease Father    Diabetes Father    Cancer Sister    Breast cancer Sister 69   Heart disease Brother     Social History   Tobacco Use   Smoking status: Former   Smokeless tobacco: Never  Scientific laboratory technician Use: Never used  Substance Use Topics   Alcohol use: Yes    Comment: occasional   Drug use: No    No current facility-administered medications for this encounter.  Current Outpatient Medications:    alendronate (FOSAMAX) 70 MG tablet, Take 70 mg by mouth once a week. Take with a full glass of water on an empty stomach., Disp: , Rfl:    cephALEXin (KEFLEX) 500 MG capsule, Take 2 capsules (1,000 mg total) by mouth 2 (two) times daily for 5 days., Disp: 20 capsule, Rfl: 0   ezetimibe (ZETIA) 10 MG tablet,  Take 10 mg by mouth daily., Disp: , Rfl:    furosemide (LASIX) 20 MG tablet, Take 20 mg by mouth daily., Disp: , Rfl:    gabapentin (NEURONTIN) 300 MG capsule, Take 900 mg by mouth 2 (two) times daily., Disp: , Rfl:    glimepiride (AMARYL) 4 MG tablet, Take 4 mg by mouth daily with breakfast., Disp: , Rfl:    insulin NPH Human (HUMULIN N,NOVOLIN N) 100 UNIT/ML injection, Inject 40 Units into the skin at bedtime., Disp: , Rfl:    lidocaine (LIDODERM) 5 %, Place 1 patch onto the skin every 12 (twelve) hours. Remove & Discard patch within 12 hours or as directed by MD, Disp: 10 patch, Rfl: 0   lisinopril (PRINIVIL,ZESTRIL) 40 MG tablet, Take 40 mg by mouth daily., Disp: , Rfl:    metFORMIN (GLUCOPHAGE) 500 MG tablet, Take 1,000 mg by  mouth 2 (two) times daily with a meal., Disp: , Rfl:    ranitidine (ZANTAC) 150 MG tablet, Take 150 mg by mouth 2 (two) times daily., Disp: , Rfl:    triamcinolone ointment (KENALOG) 0.1 %, Apply 1 application topically 2 (two) times daily., Disp: 30 g, Rfl: 0   UNABLE TO FIND, Med Name: Cosyntex., Disp: , Rfl:    vitamin B-12 (CYANOCOBALAMIN) 1000 MCG tablet, Take 1,000 mcg by mouth daily., Disp: , Rfl:    Apremilast (OTEZLA PO), Take 30 mg by mouth., Disp: , Rfl:   Allergies  Allergen Reactions   Amoxicillin Other (See Comments)    Other Reaction: GI upset, cramps     ROS  As noted in HPI.   Physical Exam  BP (!) 177/95 (BP Location: Left Arm) Comment: Repeat   Pulse (!) 116    Temp 98.2 F (36.8 C) (Oral)    Resp (!) 22    Wt 81.4 kg    SpO2 98%    BMI 33.92 kg/m   BP Readings from Last 3 Encounters:  01/05/22 (!) 177/95  11/12/21 (!) 166/85  01/28/21 (!) 153/85     Constitutional: Well developed, well nourished, no acute distress Eyes:  EOMI, conjunctiva normal bilaterally HENT: Normocephalic, atraumatic,mucus membranes moist Respiratory: Normal inspiratory effort Cardiovascular: Regular tachycardia GI: nondistended skin: Skin intact over both legs, ankles, feet intact Musculoskeletal: Nontender papular rash bilateral lower extremities.  Slight dusky discoloration.  Trace edema bilaterally.  Dry skin over lateral ankles bilaterally.  DP 2+ and equal bilaterally        Neurologic: Alert & oriented x 3, no focal neuro deficits Psychiatric: Speech and behavior appropriate   ED Course   Medications - No data to display  Orders Placed This Encounter  Procedures   Glucose, capillary    Standing Status:   Standing    Number of Occurrences:   1   CBG monitoring, ED    Standing Status:   Standing    Number of Occurrences:   1    Results for orders placed or performed during the hospital encounter of 01/05/22 (from the past 24 hour(s))  Glucose, capillary      Status: Abnormal   Collection Time: 01/05/22  5:17 PM  Result Value Ref Range   Glucose-Capillary 108 (H) 70 - 99 mg/dL   No results found.  ED Clinical Impression  1. Stasis dermatitis of both legs      ED Assessment/Plan  Presentation consistent with a stasis dermatitis, however, concern for early infection due to mild tachycardia. Fingerstick 108.   Will treat as a stasis  dermatitis with gentle skin cleansing and frequent use of bland emollients such as Vaseline, triamcinolone 0.1% ointment once or twice daily for 1 to 2-week per up-to-date guidelines, elevation, compression stockings.  Wait-and-see prescription of Keflex-does not appear to be a cellulitis just yet, but will send home with this in case she does develop a cellulitis.  Patient does not remember the reaction to penicillins, denies anaphylaxis.  Discussed small chance of cross-reactivity with cephalosporins.  Patient is willing to try it.  Follow-up with PMD as needed.  Discussed labs, , MDM, treatment plan, and plan for follow-up with patient. Discussed sn/sx that should prompt return to the ED. patient agrees with plan.   Meds ordered this encounter  Medications   triamcinolone ointment (KENALOG) 0.1 %    Sig: Apply 1 application topically 2 (two) times daily.    Dispense:  30 g    Refill:  0   cephALEXin (KEFLEX) 500 MG capsule    Sig: Take 2 capsules (1,000 mg total) by mouth 2 (two) times daily for 5 days.    Dispense:  20 capsule    Refill:  0      *This clinic note was created using [employer]. Therefore, there may be occasional mistakes despite careful proofreading.  ?    Melynda Ripple, MD 01/06/22 [phone]

## 2022-01-05 NOTE — ED Triage Notes (Signed)
Patient is here for "Rash" on both legs. Has been there for a "few wks". Has seen PCP on "Tuesday" for this. Now burning, itching, hurting. Not sleeping at all due to this. No medication/direction from PCP "was told that my younger days had caught up with me & diabetic neuropathy was not better".

## 2022-01-27 ENCOUNTER — Emergency Department: Payer: Medicare Other

## 2022-01-27 ENCOUNTER — Other Ambulatory Visit: Payer: Self-pay

## 2022-01-27 ENCOUNTER — Inpatient Hospital Stay
Admission: EM | Admit: 2022-01-27 | Discharge: 2022-01-29 | DRG: 291 | Disposition: A | Discharge status: Home / Self Care | Payer: Medicare Other | Attending: Internal Medicine | Admitting: Internal Medicine

## 2022-01-27 DIAGNOSIS — Z794 Long term (current) use of insulin: Secondary | ICD-10-CM

## 2022-01-27 DIAGNOSIS — J9602 Acute respiratory failure with hypercapnia: Secondary | ICD-10-CM | POA: Diagnosis present

## 2022-01-27 DIAGNOSIS — Z833 Family history of diabetes mellitus: Secondary | ICD-10-CM

## 2022-01-27 DIAGNOSIS — K219 Gastro-esophageal reflux disease without esophagitis: Secondary | ICD-10-CM | POA: Diagnosis present

## 2022-01-27 DIAGNOSIS — Z880 Allergy status to penicillin: Secondary | ICD-10-CM

## 2022-01-27 DIAGNOSIS — I959 Hypotension, unspecified: Secondary | ICD-10-CM | POA: Diagnosis present

## 2022-01-27 DIAGNOSIS — Z7984 Long term (current) use of oral hypoglycemic drugs: Secondary | ICD-10-CM

## 2022-01-27 DIAGNOSIS — E1159 Type 2 diabetes mellitus with other circulatory complications: Secondary | ICD-10-CM

## 2022-01-27 DIAGNOSIS — E119 Type 2 diabetes mellitus without complications: Secondary | ICD-10-CM

## 2022-01-27 DIAGNOSIS — R778 Other specified abnormalities of plasma proteins: Secondary | ICD-10-CM

## 2022-01-27 DIAGNOSIS — F17200 Nicotine dependence, unspecified, uncomplicated: Secondary | ICD-10-CM | POA: Diagnosis present

## 2022-01-27 DIAGNOSIS — J9621 Acute and chronic respiratory failure with hypoxia: Secondary | ICD-10-CM | POA: Diagnosis present

## 2022-01-27 DIAGNOSIS — E669 Obesity, unspecified: Secondary | ICD-10-CM | POA: Diagnosis present

## 2022-01-27 DIAGNOSIS — D509 Iron deficiency anemia, unspecified: Secondary | ICD-10-CM | POA: Diagnosis present

## 2022-01-27 DIAGNOSIS — R0602 Shortness of breath: Secondary | ICD-10-CM | POA: Diagnosis not present

## 2022-01-27 DIAGNOSIS — Z7983 Long term (current) use of bisphosphonates: Secondary | ICD-10-CM

## 2022-01-27 DIAGNOSIS — E785 Hyperlipidemia, unspecified: Secondary | ICD-10-CM | POA: Diagnosis present

## 2022-01-27 DIAGNOSIS — I5023 Acute on chronic systolic (congestive) heart failure: Secondary | ICD-10-CM | POA: Diagnosis present

## 2022-01-27 DIAGNOSIS — I110 Hypertensive heart disease with heart failure: Principal | ICD-10-CM | POA: Diagnosis present

## 2022-01-27 DIAGNOSIS — G4733 Obstructive sleep apnea (adult) (pediatric): Secondary | ICD-10-CM | POA: Diagnosis present

## 2022-01-27 DIAGNOSIS — Z981 Arthrodesis status: Secondary | ICD-10-CM

## 2022-01-27 DIAGNOSIS — Z720 Tobacco use: Secondary | ICD-10-CM | POA: Diagnosis present

## 2022-01-27 DIAGNOSIS — Z6830 Body mass index (BMI) 30.0-30.9, adult: Secondary | ICD-10-CM

## 2022-01-27 DIAGNOSIS — I248 Other forms of acute ischemic heart disease: Secondary | ICD-10-CM | POA: Diagnosis present

## 2022-01-27 DIAGNOSIS — R52 Pain, unspecified: Secondary | ICD-10-CM

## 2022-01-27 DIAGNOSIS — Z8249 Family history of ischemic heart disease and other diseases of the circulatory system: Secondary | ICD-10-CM

## 2022-01-27 DIAGNOSIS — R7989 Other specified abnormal findings of blood chemistry: Secondary | ICD-10-CM

## 2022-01-27 DIAGNOSIS — E1140 Type 2 diabetes mellitus with diabetic neuropathy, unspecified: Secondary | ICD-10-CM | POA: Diagnosis present

## 2022-01-27 DIAGNOSIS — J9622 Acute and chronic respiratory failure with hypercapnia: Secondary | ICD-10-CM | POA: Diagnosis present

## 2022-01-27 DIAGNOSIS — E872 Acidosis, unspecified: Secondary | ICD-10-CM | POA: Diagnosis present

## 2022-01-27 DIAGNOSIS — D649 Anemia, unspecified: Secondary | ICD-10-CM

## 2022-01-27 DIAGNOSIS — I509 Heart failure, unspecified: Secondary | ICD-10-CM

## 2022-01-27 DIAGNOSIS — J441 Chronic obstructive pulmonary disease with (acute) exacerbation: Principal | ICD-10-CM | POA: Diagnosis present

## 2022-01-27 DIAGNOSIS — J9601 Acute respiratory failure with hypoxia: Secondary | ICD-10-CM | POA: Diagnosis present

## 2022-01-27 DIAGNOSIS — Z79899 Other long term (current) drug therapy: Secondary | ICD-10-CM

## 2022-01-27 DIAGNOSIS — Z20822 Contact with and (suspected) exposure to covid-19: Secondary | ICD-10-CM | POA: Diagnosis present

## 2022-01-27 LAB — BLOOD GAS, ARTERIAL
Acid-Base Excess: 2.1 mmol/L — ABNORMAL HIGH (ref 0.0–2.0)
Bicarbonate: 30.1 mmol/L — ABNORMAL HIGH (ref 20.0–28.0)
Delivery systems: POSITIVE
Expiratory PAP: 8
FIO2: 1
Inspiratory PAP: 12
Mechanical Rate: 12
O2 Saturation: 99.2 %
Patient temperature: 37
RATE: 12 resp/min
pCO2 arterial: 64 mmHg — ABNORMAL HIGH (ref 32.0–48.0)
pH, Arterial: 7.28 — ABNORMAL LOW (ref 7.350–7.450)
pO2, Arterial: 156 mmHg — ABNORMAL HIGH (ref 83.0–108.0)

## 2022-01-27 LAB — CBC WITH DIFFERENTIAL/PLATELET
Abs Immature Granulocytes: 0.04 10*3/uL (ref 0.00–0.07)
Basophils Absolute: 0.1 10*3/uL (ref 0.0–0.1)
Basophils Relative: 1 %
Eosinophils Absolute: 0.2 10*3/uL (ref 0.0–0.5)
Eosinophils Relative: 1 %
HCT: 35.4 % — ABNORMAL LOW (ref 36.0–46.0)
Hemoglobin: 10.3 g/dL — ABNORMAL LOW (ref 12.0–15.0)
Immature Granulocytes: 0 %
Lymphocytes Relative: 34 %
Lymphs Abs: 3.9 10*3/uL (ref 0.7–4.0)
MCH: 26.3 pg (ref 26.0–34.0)
MCHC: 29.1 g/dL — ABNORMAL LOW (ref 30.0–36.0)
MCV: 90.3 fL (ref 80.0–100.0)
Monocytes Absolute: 0.8 10*3/uL (ref 0.1–1.0)
Monocytes Relative: 7 %
Neutro Abs: 6.4 10*3/uL (ref 1.7–7.7)
Neutrophils Relative %: 57 %
Platelets: 311 10*3/uL (ref 150–400)
RBC: 3.92 MIL/uL (ref 3.87–5.11)
RDW: 18.5 % — ABNORMAL HIGH (ref 11.5–15.5)
WBC: 11.3 10*3/uL — ABNORMAL HIGH (ref 4.0–10.5)
nRBC: 0.3 % — ABNORMAL HIGH (ref 0.0–0.2)

## 2022-01-27 LAB — PROTIME-INR
INR: 1.1 (ref 0.8–1.2)
Prothrombin Time: 13.7 seconds (ref 11.4–15.2)

## 2022-01-27 LAB — COMPREHENSIVE METABOLIC PANEL
ALT: 17 U/L (ref 0–44)
AST: 29 U/L (ref 15–41)
Albumin: 3 g/dL — ABNORMAL LOW (ref 3.5–5.0)
Alkaline Phosphatase: 61 U/L (ref 38–126)
Anion gap: 9 (ref 5–15)
BUN: 11 mg/dL (ref 8–23)
CO2: 30 mmol/L (ref 22–32)
Calcium: 8.3 mg/dL — ABNORMAL LOW (ref 8.9–10.3)
Chloride: 99 mmol/L (ref 98–111)
Creatinine, Ser: 1.03 mg/dL — ABNORMAL HIGH (ref 0.44–1.00)
GFR, Estimated: 57 mL/min — ABNORMAL LOW (ref >60)
Glucose, Bld: 320 mg/dL — ABNORMAL HIGH (ref 70–99)
Potassium: 3.5 mmol/L (ref 3.5–5.1)
Sodium: 138 mmol/L (ref 135–145)
Total Bilirubin: 0.6 mg/dL (ref 0.3–1.2)
Total Protein: 8 g/dL (ref 6.5–8.1)

## 2022-01-27 LAB — TROPONIN I (HIGH SENSITIVITY): Troponin I (High Sensitivity): 38 ng/L — ABNORMAL HIGH (ref <18)

## 2022-01-27 LAB — APTT: aPTT: 33 seconds (ref 24–36)

## 2022-01-27 LAB — MAGNESIUM: Magnesium: 1.3 mg/dL — ABNORMAL LOW (ref 1.7–2.4)

## 2022-01-27 LAB — BRAIN NATRIURETIC PEPTIDE: B Natriuretic Peptide: 721.9 pg/mL — ABNORMAL HIGH (ref 0.0–100.0)

## 2022-01-27 LAB — LACTIC ACID, PLASMA: Lactic Acid, Venous: 2.5 mmol/L (ref 0.5–1.9)

## 2022-01-27 MED ORDER — MIDAZOLAM HCL 2 MG/2ML IJ SOLN
INTRAMUSCULAR | Status: AC
  Administered 2022-01-27: 2 mg
  Filled 2022-01-27: qty 2

## 2022-01-27 MED ORDER — METHYLPREDNISOLONE SODIUM SUCC 125 MG IJ SOLR
125.0000 mg | Freq: Once | INTRAMUSCULAR | Status: AC
  Administered 2022-01-27: 125 mg via INTRAVENOUS
  Filled 2022-01-27: qty 2

## 2022-01-27 MED ORDER — MIDAZOLAM HCL 2 MG/2ML IJ SOLN
2.0000 mg | Freq: Once | INTRAMUSCULAR | Status: AC
Start: 2022-01-27 — End: 2022-01-27

## 2022-01-27 MED ORDER — IPRATROPIUM-ALBUTEROL 0.5-2.5 (3) MG/3ML IN SOLN
6.0000 mL | Freq: Once | RESPIRATORY_TRACT | Status: AC
  Administered 2022-01-27: 6 mL via RESPIRATORY_TRACT
  Filled 2022-01-27: qty 3

## 2022-01-27 MED ORDER — NITROGLYCERIN IN D5W 200-5 MCG/ML-% IV SOLN
50.0000 ug/min | INTRAVENOUS | Status: DC
  Administered 2022-01-27: 50 ug/min via INTRAVENOUS
  Filled 2022-01-27 (×2): qty 250

## 2022-01-27 NOTE — ED Triage Notes (Signed)
Pt arrives Emergency traffic via EMS.EMS called out for resp distress O2 70% on 2L pt wears sometimes at home. Pt feet are more swollen than normal. Pt has hx of chf and copd

## 2022-01-27 NOTE — ED Provider Notes (Signed)
[facility] Provider Note    Event Date/Time   First MD Initiated Contact with Patient 01/27/22 2255     (approximate)   History   Respiratory Distress   HPI  Kelsey Mcdowell is a 76 y.o. female who presents to the ED for evaluation of Respiratory Distress   I reviewed PCP visit from June 2021, history of obesity, HTN, OSA, DM.  Lifelong smoker.  On furosemide, but no echoes to review.  Patient presents to the ED from home via EMS for evaluation of respiratory distress.  Patient is unable to provide much history due to her respiratory status on arrival and significant anxiety associated with trying to wear a CPAP/BiPAP mask.  She reports feeling increasing swelling to her lower extremities, shortness of breath, orthopnea and wheezing over the past few days.  Denies chest pain, syncope, fever or falls.   Physical Exam   Triage Vital Signs: ED Triage Vitals  Enc Vitals Group     BP 01/27/22 2250 (!) 181/111     Pulse Rate 01/27/22 2250 (!) 137     Resp 01/27/22 2250 (!) 28     Temp 01/27/22 2256 99 F (37.2 C)     Temp Source 01/27/22 2256 Axillary     SpO2 01/27/22 2250 (!) 80 %     Weight 01/27/22 2258 170 lb (77.1 kg)     Height 01/27/22 2258 5\' 3"  (1.6 m)     Head Circumference --      Peak Flow --      Pain Score 01/27/22 2258 0     Pain Loc --      Pain Edu? --      Excl. in [place]? --     Most recent vital signs: Vitals:   01/27/22 2256 01/27/22 2305  BP:  (!) 165/102  Pulse: (!) 132 (!) 126  Resp: (!) 22 (!) 22  Temp: 99 F (37.2 C)   SpO2: 95% 100%    General: Awake, sitting upright, tripoding and obviously distressed and tachypneic. CV:  Good peripheral perfusion.  Tachycardic and regular Resp:  Normal effort.  Tachypneic to about 30.  Diffuse expiratory wheezes and poor airflow throughout.  Bibasilar crackles are present. Abd:  No distention.  Nontender MSK:  No deformity noted.  Trace pitting edema to bilateral  lower extremities. Neuro:  No focal deficits appreciated. Other:     ED Results / Procedures / Treatments   Labs (all labs ordered are listed, but only abnormal results are displayed) Labs Reviewed  CBC WITH DIFFERENTIAL/PLATELET - Abnormal; Notable for the following components:      Result Value   WBC 11.3 (*)    Hemoglobin 10.3 (*)    HCT 35.4 (*)    MCHC 29.1 (*)    RDW 18.5 (*)    nRBC 0.3 (*)    All other components within normal limits  BLOOD GAS, ARTERIAL - Abnormal; Notable for the following components:   pH, Arterial 7.28 (*)    pCO2 arterial 64 (*)    pO2, Arterial 156 (*)    Bicarbonate 30.1 (*)    Acid-Base Excess 2.1 (*)    All other components within normal limits  RESP PANEL BY RT-PCR (FLU A&B, COVID) ARPGX2  CULTURE, BLOOD (SINGLE)  URINALYSIS, ROUTINE W REFLEX MICROSCOPIC  COMPREHENSIVE METABOLIC PANEL  BRAIN NATRIURETIC PEPTIDE  MAGNESIUM  PROTIME-INR  APTT  LACTIC ACID, PLASMA  LACTIC ACID, PLASMA  TROPONIN I (HIGH SENSITIVITY)  EKG Sinus tachycardia with a rate of 130 bpm.  Normal axis and intervals.  No STEMI.  RADIOLOGY One-view portable CXR reviewed by me with patchy multifocal infiltrates and pulmonary vascular congestion  Official radiology report(s): No results found.  PROCEDURES and INTERVENTIONS:  .1-3 Lead EKG Interpretation Performed by: Vladimir Crofts, MD Authorized by: Vladimir Crofts, MD     Interpretation: abnormal     ECG rate:  120   ECG rate assessment: tachycardic     Rhythm: sinus tachycardia     Ectopy: none     Conduction: normal   .Critical Care Performed by: Vladimir Crofts, MD Authorized by: Vladimir Crofts, MD   Critical care provider statement:    Critical care time (minutes):  30   Critical care time was exclusive of:  Separately billable procedures and treating other patients   Critical care was necessary to treat or prevent imminent or life-threatening deterioration of the following conditions:  Respiratory  failure   Critical care was time spent personally by me on the following activities:  Development of treatment plan with patient or surrogate, discussions with consultants, evaluation of patient's response to treatment, examination of patient, ordering and review of laboratory studies, ordering and review of radiographic studies, ordering and performing treatments and interventions, pulse oximetry, re-evaluation of patient's condition and review of old charts  Medications  nitroGLYCERIN 50 mg in dextrose 5 % 250 mL (0.2 mg/mL) infusion (has no administration in time range)  ipratropium-albuterol (DUONEB) 0.5-2.5 (3) MG/3ML nebulizer solution 6 mL (has no administration in time range)  methylPREDNISolone sodium succinate (SOLU-MEDROL) 125 mg/2 mL injection 125 mg (has no administration in time range)  midazolam (VERSED) injection 2 mg (2 mg Intravenous Given 01/27/22 2252)     IMPRESSION / MDM / [place] / ED COURSE  I reviewed the triage vital signs and the nursing notes.  76 year old female presents to the ED with hypoxic and hypercapnic respiratory failure, likely commendation of COPD and CHF exacerbations.  Presents tachycardic, tachypneic and hypoxic.  She is quite anxious on arrival and initially refusing BiPAP, despite this being quite necessary in this situation.  Provided 2 mg of IV Versed with improvement of her tolerance of this.  She clinically improves and has a slowing respiratory rate with BiPAP.  ABG demonstrates respiratory acidosis.  Blood work is just starting to return at the end of my shift as patient is signed out to oncoming provider.  She will require medical admission for respiratory failure.  We will initiate diuresis after electrolytes are back, and no clear indications for antibiotics yet pending remainder of sepsis screening labs and cxr.  Clinical Course as of 01/27/22 2326  Nancy Fetter Jan 27, 2022  2306 Reassessed.  Settling into the BiPAP well.  2 nieces at the  bedside and I discussed my concerns.  We discussed admission and they are agreeable [DS]    Clinical Course User Index [DS] Vladimir Crofts, MD     FINAL CLINICAL IMPRESSION(S) / ED DIAGNOSES   Final diagnoses:  COPD exacerbation ([place])  Acute on chronic congestive heart failure, unspecified heart failure type ([place])  Acute respiratory failure with hypoxia and hypercapnia ([place])     Rx / DC Orders   ED Discharge Orders     None        Note:  This document was prepared using Dragon voice recognition software and may include unintentional dictation errors.   Vladimir Crofts, MD 01/27/22 [phone]

## 2022-01-28 ENCOUNTER — Other Ambulatory Visit: Payer: Self-pay

## 2022-01-28 ENCOUNTER — Inpatient Hospital Stay
Admit: 2022-01-28 | Discharge: 2022-01-28 | Disposition: A | Discharge status: Home / Self Care | Payer: Medicare Other | Attending: Internal Medicine | Admitting: Internal Medicine

## 2022-01-28 DIAGNOSIS — I509 Heart failure, unspecified: Secondary | ICD-10-CM

## 2022-01-28 DIAGNOSIS — J9622 Acute and chronic respiratory failure with hypercapnia: Secondary | ICD-10-CM | POA: Diagnosis not present

## 2022-01-28 DIAGNOSIS — J9602 Acute respiratory failure with hypercapnia: Secondary | ICD-10-CM | POA: Diagnosis not present

## 2022-01-28 DIAGNOSIS — J441 Chronic obstructive pulmonary disease with (acute) exacerbation: Secondary | ICD-10-CM

## 2022-01-28 DIAGNOSIS — Z880 Allergy status to penicillin: Secondary | ICD-10-CM | POA: Diagnosis not present

## 2022-01-28 DIAGNOSIS — I959 Hypotension, unspecified: Secondary | ICD-10-CM | POA: Diagnosis present

## 2022-01-28 DIAGNOSIS — Z6830 Body mass index (BMI) 30.0-30.9, adult: Secondary | ICD-10-CM | POA: Diagnosis not present

## 2022-01-28 DIAGNOSIS — E119 Type 2 diabetes mellitus without complications: Secondary | ICD-10-CM | POA: Diagnosis not present

## 2022-01-28 DIAGNOSIS — E669 Obesity, unspecified: Secondary | ICD-10-CM

## 2022-01-28 DIAGNOSIS — I110 Hypertensive heart disease with heart failure: Secondary | ICD-10-CM | POA: Diagnosis present

## 2022-01-28 DIAGNOSIS — R7989 Other specified abnormal findings of blood chemistry: Secondary | ICD-10-CM

## 2022-01-28 DIAGNOSIS — Z981 Arthrodesis status: Secondary | ICD-10-CM | POA: Diagnosis not present

## 2022-01-28 DIAGNOSIS — Z79899 Other long term (current) drug therapy: Secondary | ICD-10-CM | POA: Diagnosis not present

## 2022-01-28 DIAGNOSIS — Z794 Long term (current) use of insulin: Secondary | ICD-10-CM | POA: Diagnosis not present

## 2022-01-28 DIAGNOSIS — E785 Hyperlipidemia, unspecified: Secondary | ICD-10-CM | POA: Diagnosis present

## 2022-01-28 DIAGNOSIS — I5023 Acute on chronic systolic (congestive) heart failure: Secondary | ICD-10-CM | POA: Diagnosis not present

## 2022-01-28 DIAGNOSIS — J9601 Acute respiratory failure with hypoxia: Secondary | ICD-10-CM | POA: Diagnosis present

## 2022-01-28 DIAGNOSIS — E1140 Type 2 diabetes mellitus with diabetic neuropathy, unspecified: Secondary | ICD-10-CM | POA: Diagnosis present

## 2022-01-28 DIAGNOSIS — I248 Other forms of acute ischemic heart disease: Secondary | ICD-10-CM | POA: Diagnosis present

## 2022-01-28 DIAGNOSIS — Z7984 Long term (current) use of oral hypoglycemic drugs: Secondary | ICD-10-CM | POA: Diagnosis not present

## 2022-01-28 DIAGNOSIS — R0602 Shortness of breath: Secondary | ICD-10-CM | POA: Diagnosis present

## 2022-01-28 DIAGNOSIS — R778 Other specified abnormalities of plasma proteins: Secondary | ICD-10-CM

## 2022-01-28 DIAGNOSIS — Z833 Family history of diabetes mellitus: Secondary | ICD-10-CM | POA: Diagnosis not present

## 2022-01-28 DIAGNOSIS — J9621 Acute and chronic respiratory failure with hypoxia: Secondary | ICD-10-CM | POA: Diagnosis not present

## 2022-01-28 DIAGNOSIS — D509 Iron deficiency anemia, unspecified: Secondary | ICD-10-CM | POA: Diagnosis not present

## 2022-01-28 DIAGNOSIS — E1159 Type 2 diabetes mellitus with other circulatory complications: Secondary | ICD-10-CM

## 2022-01-28 DIAGNOSIS — Z20822 Contact with and (suspected) exposure to covid-19: Secondary | ICD-10-CM | POA: Diagnosis present

## 2022-01-28 DIAGNOSIS — E872 Acidosis, unspecified: Secondary | ICD-10-CM | POA: Diagnosis not present

## 2022-01-28 DIAGNOSIS — D649 Anemia, unspecified: Secondary | ICD-10-CM

## 2022-01-28 DIAGNOSIS — Z8249 Family history of ischemic heart disease and other diseases of the circulatory system: Secondary | ICD-10-CM | POA: Diagnosis not present

## 2022-01-28 DIAGNOSIS — Z7983 Long term (current) use of bisphosphonates: Secondary | ICD-10-CM | POA: Diagnosis not present

## 2022-01-28 DIAGNOSIS — Z720 Tobacco use: Secondary | ICD-10-CM | POA: Diagnosis present

## 2022-01-28 DIAGNOSIS — K219 Gastro-esophageal reflux disease without esophagitis: Secondary | ICD-10-CM | POA: Diagnosis present

## 2022-01-28 LAB — GLUCOSE, CAPILLARY
Glucose-Capillary: 277 mg/dL — ABNORMAL HIGH (ref 70–99)
Glucose-Capillary: 193 mg/dL — ABNORMAL HIGH (ref 70–99)
Glucose-Capillary: 177 mg/dL — ABNORMAL HIGH (ref 70–99)

## 2022-01-28 LAB — URINALYSIS, ROUTINE W REFLEX MICROSCOPIC
Bacteria, UA: NONE SEEN
Bilirubin Urine: NEGATIVE
Glucose, UA: >1000 mg/dL — AB
Ketones, ur: 15 mg/dL — AB
Leukocytes,Ua: NEGATIVE
Nitrite: NEGATIVE
Protein, ur: >300 mg/dL — AB
Specific Gravity, Urine: >1.03 — ABNORMAL HIGH (ref 1.005–1.030)
pH: 5.5 (ref 5.0–8.0)

## 2022-01-28 LAB — CBC
HCT: 31.8 % — ABNORMAL LOW (ref 36.0–46.0)
Hemoglobin: 9.4 g/dL — ABNORMAL LOW (ref 12.0–15.0)
MCH: 26.5 pg (ref 26.0–34.0)
MCHC: 29.6 g/dL — ABNORMAL LOW (ref 30.0–36.0)
MCV: 89.6 fL (ref 80.0–100.0)
Platelets: 247 10*3/uL (ref 150–400)
RBC: 3.55 MIL/uL — ABNORMAL LOW (ref 3.87–5.11)
RDW: 18.2 % — ABNORMAL HIGH (ref 11.5–15.5)
WBC: 10 10*3/uL (ref 4.0–10.5)
nRBC: 0 % (ref 0.0–0.2)

## 2022-01-28 LAB — BASIC METABOLIC PANEL
Anion gap: 8 (ref 5–15)
BUN: 11 mg/dL (ref 8–23)
CO2: 27 mmol/L (ref 22–32)
Calcium: 7.8 mg/dL — ABNORMAL LOW (ref 8.9–10.3)
Chloride: 102 mmol/L (ref 98–111)
Creatinine, Ser: 0.95 mg/dL (ref 0.44–1.00)
GFR, Estimated: >60 mL/min (ref >60)
Glucose, Bld: 335 mg/dL — ABNORMAL HIGH (ref 70–99)
Potassium: 3.6 mmol/L (ref 3.5–5.1)
Sodium: 137 mmol/L (ref 135–145)

## 2022-01-28 LAB — RESP PANEL BY RT-PCR (FLU A&B, COVID) ARPGX2
Influenza A by PCR: NEGATIVE
Influenza B by PCR: NEGATIVE
SARS Coronavirus 2 by RT PCR: NEGATIVE

## 2022-01-28 LAB — PROCALCITONIN: Procalcitonin: 13.49 ng/mL

## 2022-01-28 LAB — CBG MONITORING, ED: Glucose-Capillary: 342 mg/dL — ABNORMAL HIGH (ref 70–99)

## 2022-01-28 LAB — HEMOGLOBIN A1C
Hgb A1c MFr Bld: 7.9 % — ABNORMAL HIGH (ref 4.8–5.6)
Mean Plasma Glucose: 180.03 mg/dL

## 2022-01-28 LAB — TROPONIN I (HIGH SENSITIVITY): Troponin I (High Sensitivity): 207 ng/L (ref <18)

## 2022-01-28 LAB — LACTIC ACID, PLASMA: Lactic Acid, Venous: 3.2 mmol/L (ref 0.5–1.9)

## 2022-01-28 MED ORDER — ACETAMINOPHEN 650 MG RE SUPP: 650.0000 mg | Freq: Four times a day (QID) | RECTAL | Status: DC | PRN

## 2022-01-28 MED ORDER — GABAPENTIN 300 MG PO CAPS
600.0000 mg | ORAL_CAPSULE | Freq: Every day | ORAL | Status: DC
  Administered 2022-01-28: 600 mg via ORAL
  Filled 2022-01-28: qty 2

## 2022-01-28 MED ORDER — HEPARIN SODIUM (PORCINE) 5000 UNIT/ML IJ SOLN
5000.0000 [IU] | Freq: Three times a day (TID) | INTRAMUSCULAR | Status: DC
  Administered 2022-01-28 (×2): 5000 [IU] via SUBCUTANEOUS
  Filled 2022-01-28 (×2): qty 1

## 2022-01-28 MED ORDER — METHYLPREDNISOLONE SODIUM SUCC 40 MG IJ SOLR
40.0000 mg | Freq: Two times a day (BID) | INTRAMUSCULAR | Status: DC
  Administered 2022-01-28 – 2022-01-29 (×3): 40 mg via INTRAVENOUS
  Filled 2022-01-28 (×3): qty 1

## 2022-01-28 MED ORDER — POTASSIUM CHLORIDE 10 MEQ/100ML IV SOLN
10.0000 meq | INTRAVENOUS | Status: DC
Start: 2022-01-28 — End: 2022-01-28

## 2022-01-28 MED ORDER — SERTRALINE HCL 50 MG PO TABS
50.0000 mg | ORAL_TABLET | Freq: Every day | ORAL | Status: DC
  Administered 2022-01-28 – 2022-01-29 (×2): 50 mg via ORAL
  Filled 2022-01-28 (×2): qty 1

## 2022-01-28 MED ORDER — PERFLUTREN LIPID MICROSPHERE
1.0000 mL | INTRAVENOUS | Status: AC | PRN
  Administered 2022-01-28: 3 mL via INTRAVENOUS
  Filled 2022-01-28: qty 10

## 2022-01-28 MED ORDER — INSULIN ASPART 100 UNIT/ML IJ SOLN
0.0000 [IU] | Freq: Three times a day (TID) | INTRAMUSCULAR | Status: DC
  Administered 2022-01-28 (×2): 11 [IU] via SUBCUTANEOUS
  Administered 2022-01-28: 3 [IU] via SUBCUTANEOUS
  Administered 2022-01-29: 5 [IU] via SUBCUTANEOUS
  Administered 2022-01-29 (×2): 2 [IU] via SUBCUTANEOUS
  Filled 2022-01-28 (×6): qty 1

## 2022-01-28 MED ORDER — NICOTINE 14 MG/24HR TD PT24
14.0000 mg | MEDICATED_PATCH | Freq: Every day | TRANSDERMAL | Status: DC
  Administered 2022-01-28 – 2022-01-29 (×2): 14 mg via TRANSDERMAL
  Filled 2022-01-28 (×2): qty 1

## 2022-01-28 MED ORDER — ARIPIPRAZOLE 2 MG PO TABS
2.0000 mg | ORAL_TABLET | Freq: Every day | ORAL | Status: DC
  Administered 2022-01-28 – 2022-01-29 (×2): 2 mg via ORAL
  Filled 2022-01-28 (×2): qty 1

## 2022-01-28 MED ORDER — MAGNESIUM SULFATE 2 GM/50ML IV SOLN
2.0000 g | Freq: Once | INTRAVENOUS | Status: AC
  Administered 2022-01-28: 2 g via INTRAVENOUS
  Filled 2022-01-28: qty 50

## 2022-01-28 MED ORDER — ACETAMINOPHEN 325 MG PO TABS: 650.0000 mg | ORAL_TABLET | Freq: Four times a day (QID) | ORAL | Status: DC | PRN

## 2022-01-28 MED ORDER — NORTRIPTYLINE HCL 25 MG PO CAPS
75.0000 mg | ORAL_CAPSULE | Freq: Every day | ORAL | Status: DC
  Administered 2022-01-28: 75 mg via ORAL
  Filled 2022-01-28 (×2): qty 3

## 2022-01-28 MED ORDER — INSULIN ASPART 100 UNIT/ML IJ SOLN: 0.0000 [IU] | Freq: Every day | INTRAMUSCULAR | Status: DC

## 2022-01-28 MED ORDER — DOCUSATE SODIUM 100 MG PO CAPS
100.0000 mg | ORAL_CAPSULE | Freq: Two times a day (BID) | ORAL | Status: DC
  Administered 2022-01-28 – 2022-01-29 (×2): 100 mg via ORAL
  Filled 2022-01-28 (×4): qty 1

## 2022-01-28 MED ORDER — INSULIN GLARGINE-YFGN 100 UNIT/ML ~~LOC~~ SOLN
30.0000 [IU] | Freq: Every day | SUBCUTANEOUS | Status: DC
  Administered 2022-01-28 – 2022-01-29 (×2): 30 [IU] via SUBCUTANEOUS
  Filled 2022-01-28 (×3): qty 0.3

## 2022-01-28 MED ORDER — INSULIN ASPART 100 UNIT/ML IJ SOLN
3.0000 [IU] | Freq: Three times a day (TID) | INTRAMUSCULAR | Status: DC
  Administered 2022-01-28 – 2022-01-29 (×6): 3 [IU] via SUBCUTANEOUS
  Filled 2022-01-28 (×6): qty 1

## 2022-01-28 MED ORDER — SODIUM CHLORIDE 0.9 % IV SOLN
2.0000 g | INTRAVENOUS | Status: DC
  Administered 2022-01-28 – 2022-01-29 (×2): 2 g via INTRAVENOUS
  Filled 2022-01-28 (×3): qty 20

## 2022-01-28 MED ORDER — HYDRALAZINE HCL 20 MG/ML IJ SOLN: 5.0000 mg | INTRAMUSCULAR | Status: DC | PRN

## 2022-01-28 MED ORDER — FUROSEMIDE 10 MG/ML IJ SOLN
20.0000 mg | Freq: Two times a day (BID) | INTRAMUSCULAR | Status: DC
  Administered 2022-01-28 (×2): 20 mg via INTRAVENOUS
  Filled 2022-01-28 (×2): qty 2

## 2022-01-28 MED ORDER — AZITHROMYCIN 500 MG PO TABS
250.0000 mg | ORAL_TABLET | Freq: Every day | ORAL | Status: DC
  Administered 2022-01-29: 250 mg via ORAL
  Filled 2022-01-28: qty 1

## 2022-01-28 MED ORDER — FUROSEMIDE 10 MG/ML IJ SOLN: 20.0000 mg | Freq: Two times a day (BID) | INTRAMUSCULAR | Status: DC

## 2022-01-28 MED ORDER — PANTOPRAZOLE SODIUM 40 MG PO TBEC
40.0000 mg | DELAYED_RELEASE_TABLET | Freq: Every day | ORAL | Status: DC
  Administered 2022-01-28 – 2022-01-29 (×2): 40 mg via ORAL
  Filled 2022-01-28 (×2): qty 1

## 2022-01-28 MED ORDER — AZITHROMYCIN 500 MG PO TABS
500.0000 mg | ORAL_TABLET | Freq: Every day | ORAL | Status: AC
  Administered 2022-01-28: 500 mg via ORAL
  Filled 2022-01-28: qty 1

## 2022-01-28 MED ORDER — GABAPENTIN 300 MG PO CAPS
900.0000 mg | ORAL_CAPSULE | Freq: Every morning | ORAL | Status: DC
  Administered 2022-01-28 – 2022-01-29 (×2): 900 mg via ORAL
  Filled 2022-01-28 (×2): qty 3

## 2022-01-28 MED ORDER — SODIUM CHLORIDE 0.9% FLUSH
3.0000 mL | Freq: Two times a day (BID) | INTRAVENOUS | Status: DC
  Administered 2022-01-28 – 2022-01-29 (×3): 3 mL via INTRAVENOUS

## 2022-01-28 MED ORDER — IPRATROPIUM-ALBUTEROL 0.5-2.5 (3) MG/3ML IN SOLN
3.0000 mL | Freq: Four times a day (QID) | RESPIRATORY_TRACT | Status: DC
  Administered 2022-01-28 – 2022-01-29 (×6): 3 mL via RESPIRATORY_TRACT
  Filled 2022-01-28 (×6): qty 3

## 2022-01-28 NOTE — Progress Notes (Signed)
OT Cancellation Note  Patient Details Name: Kelsey Mcdowell MRN: [id number] DOB: 1946/03/13   Cancelled Treatment:    Reason Eval/Treat Not Completed: Patient not medically ready. Order received. Per chart review, pt recently taken off BiPAP and RN recommending therapy hold at this time d/t respiratory concerns. OT to re-attempt at later time/date as able.   Fredirick Maudlin, OTR/L [place]

## 2022-01-28 NOTE — Assessment & Plan Note (Addendum)
The patient was given Solu-Medrol while here in the hospital and will be given prednisone for 3 more days upon disposition

## 2022-01-28 NOTE — Assessment & Plan Note (Addendum)
Started empiric antibiotics with Rocephin and Zithromax here in the hospital and I will discharge on Omnicef and Zithromax.

## 2022-01-28 NOTE — Assessment & Plan Note (Signed)
Secondary to demand ischemia with acute hypoxic hypercarbic respiratory failure

## 2022-01-28 NOTE — Progress Notes (Signed)
PT Cancellation Note  Patient Details Name: Kelsey Mcdowell MRN: [id number] DOB: 06-09-1946   Cancelled Treatment:    Reason Eval/Treat Not Completed: Patient not medically ready.  PT consult received.  Chart reviewed.  Per discussion with pt's nurse, pt recently taken off Bi-PAP and recommending holding therapy at this time (d/t respiratory concerns).  Will re-attempt PT evaluation at a later date/time as medically appropriate.  Leitha Bleak, PT 01/28/22, 10:00 AM

## 2022-01-28 NOTE — H&P (Signed)
History and Physical    Patient: Kelsey Mcdowell [id number]:[id number] DOB: July 11, 1946 DOA: 01/27/2022 DOS: the patient was seen and examined on 01/28/2022 PCP: Lynnell Jude, MD  Patient coming from: Home  Chief Complaint:  Chief Complaint  Patient presents with   Respiratory Distress    HPI: Kelsey Mcdowell is a 76 y.o. female seen in ed with sob.  HPI is limited due to NIPPV due to SOB.  Patient was brought by EMS due to lower extremity edema and shortness of breath orthopnea over the past few days.  Review of systems is otherwise negative.  Patient's clinical presentation and acuity of illness making patient's prognosis fair. Pt received NTG drip in ed which was stopped as chest pain resolved.  Pt reports that she has not voided urine. Bladder scan shows about 320 cc.   Review of Systems:  Review of Systems  Respiratory:  Positive for shortness of breath.   Cardiovascular:  Positive for orthopnea, leg swelling and PND.  Genitourinary:        Has not voided urine today.    Past Medical History:  Diagnosis Date   Arthritis    Diabetes mellitus without complication (HCC)    GERD (gastroesophageal reflux disease)    Hyperlipemia    Hypertension    Neuropathy    Sleep apnea    Past Surgical History:  Procedure Laterality Date   ABDOMINAL HYSTERECTOMY     APPENDECTOMY     BREAST EXCISIONAL BIOPSY Left 1970's   benign   CHOLECYSTECTOMY     COLONOSCOPY WITH PROPOFOL N/A 09/30/2018   Procedure: COLONOSCOPY WITH PROPOFOL;  Surgeon: Toledo, Benay Pike, MD;  Location: [facility] END[facility]COPY;  Service: Gastroenterology;  Laterality: N/A;   SPINE SURGERY  1998   cervical fusion   Social History:  reports that she has quit smoking. She has never used smokeless tobacco. She reports current alcohol use. She reports that she does not use drugs.  Allergies  Allergen Reactions   Amoxicillin Other (See Comments)    Other Reaction: GI upset, cramps    Family History  Problem  Relation Age of Onset   Cancer Mother    Hypertension Mother    Diabetes Mother    Heart disease Father    Diabetes Father    Cancer Sister    Breast cancer Sister 31   Heart disease Brother     Prior to Admission medications   Medication Sig Start Date End Date Taking? Authorizing Provider  alendronate (FOSAMAX) 70 MG tablet Take 70 mg by mouth once a week. Take with a full glass of water on an empty stomach.    [provider]  Apremilast (OTEZLA PO) Take 30 mg by mouth.    [provider]  ezetimibe (ZETIA) 10 MG tablet Take 10 mg by mouth daily.    [provider]  furosemide (LASIX) 20 MG tablet Take 20 mg by mouth daily.    [provider]  gabapentin (NEURONTIN) 300 MG capsule Take 900 mg by mouth 2 (two) times daily.    [provider]  glimepiride (AMARYL) 4 MG tablet Take 4 mg by mouth daily with breakfast.    [provider]  insulin NPH Human (HUMULIN N,NOVOLIN N) 100 UNIT/ML injection Inject 40 Units into the skin at bedtime.    [provider]  lidocaine (LIDODERM) 5 % Place 1 patch onto the skin every 12 (twelve) hours. Remove & Discard patch within 12 hours or as directed by MD  11/13/21 11/13/22  Merlyn Lot, MD  lisinopril (PRINIVIL,ZESTRIL) 40 MG tablet Take 40 mg by mouth daily.    [provider]  metFORMIN (GLUCOPHAGE) 500 MG tablet Take 1,000 mg by mouth 2 (two) times daily with a meal.    [provider]  ranitidine (ZANTAC) 150 MG tablet Take 150 mg by mouth 2 (two) times daily.    [provider]  triamcinolone ointment (KENALOG) 0.1 % Apply 1 application topically 2 (two) times daily. 01/05/22   Melynda Ripple, MD  UNABLE TO FIND Med Name: Cosyntex.    [provider]  vitamin B-12 (CYANOCOBALAMIN) 1000 MCG tablet Take 1,000 mcg by mouth daily.    [provider]    Physical Exam: Vitals:   01/27/22 2335 01/27/22 2336 01/27/22 2337 01/27/22 2345   BP: 139/88 139/83 120/89 110/81  Pulse: (!) 116 (!) 117 (!) 117 (!) 116  Resp: 18 (!) 21 19 18   Temp:      TempSrc:      SpO2: 99% 98% 99% 99%  Weight:      Height:      Physical Exam Vitals reviewed.  Constitutional:      General: She is not in acute distress.    Appearance: She is not ill-appearing.  HENT:     Right Ear: External ear normal.     Left Ear: External ear normal.     Nose: Nose normal.     Mouth/Throat:     Mouth: Mucous membranes are moist.  Eyes:     Extraocular Movements: Extraocular movements intact.     Pupils: Pupils are equal, round, and reactive to light.  Neck:     Vascular: No carotid bruit.  Cardiovascular:     Rate and Rhythm: Normal rate and regular rhythm.     Pulses: Normal pulses.     Heart sounds: Normal heart sounds.  Pulmonary:     Effort: Pulmonary effort is normal.     Breath sounds: Rales present.  Abdominal:     General: Bowel sounds are normal. There is no distension.     Palpations: Abdomen is soft. There is no mass.     Tenderness: There is no abdominal tenderness. There is no guarding.     Hernia: No hernia is present.  Musculoskeletal:     Right lower leg: Edema present.     Left lower leg: Edema present.  Neurological:     General: No focal deficit present.     Mental Status: She is alert and oriented to person, place, and time.  Psychiatric:        Mood and Affect: Mood normal.        Behavior: Behavior normal.    Data Reviewed: > ABG shows 7.28 PCO2 of 64 PO2 156, bicarb of 30.1 > CMP shows glucose of 320 creatinine 1.03 magnesium of 1.3 GFR 57. > BNP 721.9, troponin of 38. > CBC shows WBC count of 11.3, hemoglobin of 10.3, platelets of 311. > Respiratory panel negative for flu and COVID. > Blood cultures collected in the ED. > Chest x-ray today shows congestive heart failure. > In the ED patient received duo nebs, Solu-Medrol.   Assessment and Plan: * Acute respiratory failure with hypoxia and hypercapnia  ([place])- (present on admission) Patient presenting with hypoxic respiratory failure and respiratory distress coming by EMS for shortness of breath.   Patient was immediately started on supplemental oxygen with NIPPV and improved. We will continue patient on BiPAP, continue management for  her heart failure. Will start patient on Lasix 20 mg IV x3 doses. Nitroglycerin drip that was initiated on arrival is stopped. Monitoring of strict I's and O's and obtained 2D echocardiogram. Cardiology consult as deemed appropriate per a.m. MD. Mild troponin elevation attributed to demand ischemia.  Tobacco abuse- (present on admission) Nicotine patch. Patient counseled on tobacco cessation.  DM II (diabetes mellitus, type II), controlled ([place]) Home regimen with metformin currently held. We will continue patient on sliding scale insulin and glycemic protocol.  Hypomagnesemia- (present on admission) Magnesium replaced with 2 g IV. Will follow mag level.  And correct as needed.   Advance Care Planning:   Code Status: Full Code   Consults: None  Family Communication: Duwaine Maxin (Niece)  [phone] (Mobile)  Severity of Illness: The appropriate patient status for this patient is INPATIENT. Inpatient status is judged to be reasonable and necessary in order to provide the required intensity of service to ensure the patient's safety. The patient's presenting symptoms, physical exam findings, and initial radiographic and laboratory data in the context of their chronic comorbidities is felt to place them at high risk for further clinical deterioration. Furthermore, it is not anticipated that the patient will be medically stable for discharge from the hospital within 2 midnights of admission.   * I certify that at the point of admission it is my clinical judgment that the patient will require inpatient hospital care spanning beyond 2 midnights from the point of admission due to high intensity of  service, high risk for further deterioration and high frequency of surveillance required.*  Author: Para Skeans, MD 01/28/2022 1:22 AM  For on call review [email or web address].

## 2022-01-28 NOTE — Assessment & Plan Note (Deleted)
Patient initially required BiPAP.  Pulse ox of 70% on presentation and CO2 elevated at 64 on ABG.  The patient has oxygen at home but does not wear it often.  This morning was able to come off the BiPAP and placed on 5 L.  She is a candidate for NIV at night secondary to elevated CO2.  I dialed her down to 3 L.

## 2022-01-28 NOTE — ED Notes (Signed)
Pt getting bedside echo at this time - will be 10 minutes +/-

## 2022-01-28 NOTE — ED Notes (Signed)
In and out cath preformed by this RN with Estill Bamberg, RN at bedside.

## 2022-01-28 NOTE — Progress Notes (Signed)
*  PRELIMINARY RESULTS* Echocardiogram 2D Echocardiogram has been performed.  [place] 01/28/2022, 10:59 AM

## 2022-01-28 NOTE — Progress Notes (Signed)
Nutrition Brief Note  Patient identified on the Malnutrition Screening Tool (MST) Report  Wt Readings from Last 15 Encounters:  01/27/22 77.1 kg  01/05/22 81.4 kg  01/28/21 76.2 kg  09/30/18 84.4 kg  08/05/17 85.5 kg  07/07/17 86.2 kg   Pt admitted with COPD exacerbation.   Reviewed I/O's: +47 ml x 24 hours  Spoke with pt at bedside, who reports feeling better today. She reports fair appetite. SHe was working on lunch at time of visit (consumed 100% of salad and juice, and 50% of broccoli and meatloaf).   Pt reports that she grazes throughout the day on items such as cinnamon toast crunch, tomato sandwiches, and chips. Pt rarely cooks as she lives by herself.   Per pt, she lost weight a few years ago after her husband passed, but has since gained her weight back. Pt shares that she thinks her diet is related to her weight gain, as she experiences high blood sugars from excessive consumption of cinnamon toast crunch.   Nutrition-Focused physical exam completed. Findings are no fat depletion, no muscle depletion, and no edema.    Medications reviewed and include colace, lasix, and solu-medrol.    Lab Results  Component Value Date   HGBA1C 7.9 (H) 01/28/2022   PTA DM medications are 40 units inuslin NPH daily and 1000 mg metformin BID.   Labs reviewed: CBGS: 277-342 (inpatient orders for glycemic control are 0-15 units insulin aspart TID with meals, 0-5 units insulin aspart daily at bedtime, 3 units insulin aspart TID with meals, and 30 units insulin lgargine-yfgn daily).    Body mass index is 30.11 kg/m. Patient meets criteria for obesity, class I based on current BMI.   Current diet order is Heart Healthy , patient is consuming approximately 50-100% of meals at this time. Labs and medications reviewed.   No nutrition interventions warranted at this time. If nutrition issues arise, please consult RD.   Loistine Chance, RD, LDN, [place] Registered Dietitian II Certified Diabetes Care  and Education Specialist Please refer to [facility] for RD and/or RD on-call/weekend/after hours pager

## 2022-01-28 NOTE — Assessment & Plan Note (Addendum)
I prescribed Lantus insulin 30 units daily.  Discontinued her NPH insulin.

## 2022-01-28 NOTE — Assessment & Plan Note (Addendum)
Replaced on admission. 

## 2022-01-28 NOTE — Progress Notes (Signed)
Progress Note   Patient: Kelsey Mcdowell YCX:[id number] DOB: 18-Jul-1946 DOA: 01/27/2022     0 DOS: the patient was seen and examined on 01/28/2022    Assessment and Plan: * Acute respiratory failure with hypoxia and hypercapnia ([place])- (present on admission) Patient initially required BiPAP.  Pulse ox of 70% on presentation and CO2 elevated at 64 on ABG.  The patient has oxygen at home but does not wear it often.  This morning was able to come off the BiPAP and placed on 5 L.  She is a candidate for NIV at night secondary to elevated CO2.  I dialed her down to 3 L.  COPD with acute exacerbation (HCC) Continue Solu-Medrol and nebulizer treatments.  Patient moving better air than on presentation.  Congestive heart failure (HCC) Echocardiogram pending.  On IV Lasix.  BNP elevated at 721.  Elevated troponin Secondary to demand ischemia with acute hypoxic hypercarbic respiratory failure  DM II (diabetes mellitus, type II), controlled ([place]) With being on steroids that sugars will be higher.  Added Semglee insulin and sliding scale insulin and short acting insulin prior to meals.  Hemoglobin A1c pending.  Hypomagnesemia- (present on admission) Replaced on admission  Tobacco abuse- (present on admission) Nicotine patch ordered  Lactic acidosis Started empiric antibiotics.  Continue to monitor.  Elevated procalcitonin Empiric antibiotics started Rocephin and Zithromax.  Procalcitonin 13.49.  Recheck tomorrow.  Anemia, unspecified Hemoglobin 9.4.  We will check hemoglobin tomorrow morning along with iron studies.  Obesity (BMI 30-39.9) BMI 30.11        Subjective: Patient feeling better and asking how long she has to stay in the hospital.  Came in because he could not breathe.  She wears her oxygen only as needed at home.  Still smoking.  Physical Exam: Vitals:   01/28/22 0945 01/28/22 1000 01/28/22 1008 01/28/22 1210  BP: (!) 114/57 (!) 108/54  121/67  Pulse: 92 92   91  Resp: 12 13  18   Temp:   98.1 F (36.7 C) 98.1 F (36.7 C)  TempSrc:   Oral   SpO2: 95% 97%  96%  Weight:      Height:       Physical Exam HENT:     Head: Normocephalic.     Mouth/Throat:     Pharynx: No oropharyngeal exudate.  Eyes:     General: Lids are normal.     Conjunctiva/sclera: Conjunctivae normal.  Cardiovascular:     Rate and Rhythm: Normal rate and regular rhythm.     Heart sounds: Normal heart sounds, S1 normal and S2 normal.  Pulmonary:     Breath sounds: Examination of the right-lower field reveals decreased breath sounds and rales. Examination of the left-lower field reveals decreased breath sounds and rales. Decreased breath sounds and rales present. No wheezing or rhonchi.  Abdominal:     Palpations: Abdomen is soft.     Tenderness: There is no abdominal tenderness.  Musculoskeletal:     Right lower leg: Swelling present.     Left lower leg: Swelling present.  Skin:    General: Skin is warm.     Comments: Chronic lower extremity skin discoloration  Neurological:     Mental Status: She is alert and oriented to person, place, and time.     Data Reviewed: Laboratory and radiological data during this hospitalization reviewed.  Sugars elevated, PCO2 elevated at 64, BNP elevated at 721, lactic acid elevated at 3.2, troponin elevated at 207, hemoglobin down at 9.4  Family Communication: Spoke with daughter on the phone  Disposition: Status is: Inpatient Remains inpatient appropriate because: Just came in and required BiPAP initially.  Need to make sure respiratory status stable prior to disposition  Planned Discharge Destination: Home  Author: Loletha Grayer, MD 01/28/2022 12:15 PM  For on call review [email or web address].

## 2022-01-28 NOTE — Evaluation (Addendum)
Occupational Therapy Evaluation Patient Details Name: Kelsey Mcdowell MRN: [id number] DOB: Mar 26, 1946 Today's Date: 01/28/2022   History of Present Illness 76 y.o. female who presented to ED with SOB and elevated troponin (suspect d/t demand ischemia). Pt admitted for acute respiratory failure with hypoxia and hypercapnia. PMH: COPD, CHF, DM, tobacco use   Clinical Impression   Pt seen for OT evaluation this date. Upon arrival to room, pt awake and sitting EOB on 3L/min of supplemental O2 with niece at bedside. Pt agreeable to OT tx. Prior to admission, pt was independent in all ADLs, IADLs, and functional mobility, living in a 1-story home alone. Pt reports that she does not typically use supplemental O2, although reports sleeping in recliner d/t difficulty breathing when laying flat in bed at baseline. Pt currently requires MIN GUARD for seated LB dressing (implementing x2 rest breaks while donning b/l socks), SUPERVISION for standing grooming tasks, and SUPERVISION for functional mobility of short household distances without AD d/t decreased activity tolerance. This date, pt stood for 5 mins while performing grooming tasks and then walk 32ft without AD before becoming fatigued and requiring rest break (SpO2 92% immediately following walk). Pt educated in energy conservation strategies including pursed lip breathing, activity pacing, and monitoring energy levels via RPE scale. Pt verbalized understanding and would benefit from additional skilled OT services to maximize recall and carryover of learned techniques and facilitate implementation of learned techniques into daily routines. Upon discharge, recommend [place] services.        Recommendations for follow up therapy are one component of a multi-disciplinary discharge planning process, led by the attending physician.  Recommendations may be updated based on patient status, additional functional criteria and insurance authorization.   Follow  Up Recommendations  Home health OT    Assistance Recommended at Discharge Intermittent Supervision/Assistance  Patient can return home with the following A little help with walking and/or transfers;A little help with bathing/dressing/bathroom;Assistance with cooking/housework    Functional Status Assessment  Patient has had a recent decline in their functional status and demonstrates the ability to make significant improvements in function in a reasonable and predictable amount of time.  Equipment Recommendations  None recommended by OT       Precautions / Restrictions Precautions Precautions: Fall Restrictions Weight Bearing Restrictions: No      Mobility Bed Mobility               General bed mobility comments: not assessed, pt sitting at EOB pre/post session    Transfers Overall transfer level: Needs assistance Equipment used: None Transfers: Sit to/from Stand Sit to Stand: Supervision           General transfer comment: supervision fot safety only. No physical assistance required      Balance Overall balance assessment: Mild deficits observed, not formally tested                                         ADL either performed or assessed with clinical judgement   ADL Overall ADL's : Needs assistance/impaired     Grooming: Wash/dry hands;Oral care;Supervision/safety;Set up;Standing               Lower Body Dressing: Min guard;Sitting/lateral leans Lower Body Dressing Details (indicate cue type and reason): to don/doff socks via figure-4 position. Requires increased time/effort and x2 seated rest breaks d/t SOB  Functional mobility during ADLs: Supervision/safety (to walk 96ft in room without AD)       Vision Baseline Vision/History: 1 Wears glasses;4 Cataracts Ability to See in Adequate Light: 1 Impaired Patient Visual Report: Diplopia (At baseline, pt reports that she has double vision (up & down) when quickly  turning eyes (eye dr is aware))              Pertinent Vitals/Pain Pain Assessment Pain Assessment: No/denies pain        Extremity/Trunk Assessment Upper Extremity Assessment Upper Extremity Assessment: Overall WFL for tasks assessed   Lower Extremity Assessment Lower Extremity Assessment: Overall WFL for tasks assessed;RLE deficits/detail RLE Sensation: decreased light touch;history of peripheral neuropathy   Cervical / Trunk Assessment Cervical / Trunk Assessment: Normal   Communication Communication Communication: No difficulties   Cognition Arousal/Alertness: Awake/alert Behavior During Therapy: WFL for tasks assessed/performed Overall Cognitive Status: Within Functional Limits for tasks assessed                                          Exercises Other Exercises Other Exercises: Pt educated in energy conservation strategies including pursed lip breathing, activity pacing, and monitoring energy levels via RPE scale. Pt verbalized understanding         Home Living Family/patient expects to be discharged to:: Private residence Living Arrangements: Alone Available Help at Discharge: Family;Available PRN/intermittently Type of Home: House Home Access: Stairs to enter [employer] of Steps: 3 (front), 4 (back door). Entrance Stairs-Rails: Can reach both Home Layout: One level (1 step down from washroom to kitchen)     Bathroom Shower/Tub: Walk-in shower;Tub/shower unit   Bathroom Toilet: Handicapped height     Home Equipment: Cane - single point;Wheelchair - Publishing copy (2 wheels);Shower seat - built in;Hand held shower head          Prior Functioning/Environment Prior Level of Function : Independent/Modified Independent             Mobility Comments: Independent without AD. Walks down steps sideways d/t peripheral neuropathy. Endorses 1 fall in past 6 months (fell off couch after falling asleep) ADLs Comments:  Independent with ADLs. drives (within town), and cooks        OT Problem List: Decreased activity tolerance;Cardiopulmonary status limiting activity;Increased edema      OT Treatment/Interventions: Self-care/ADL training;Therapeutic exercise;Energy conservation;DME and/or AE instruction;Therapeutic activities;Patient/family education;Balance training    OT Goals(Current goals can be found in the care plan section) Acute Rehab OT Goals Patient Stated Goal: to return to cooking OT Goal Formulation: With patient Time For Goal Achievement: 02/11/22 Potential to Achieve Goals: Good ADL Goals Pt Will Perform Grooming: with modified independence;standing Pt Will Perform Upper Body Bathing: with modified independence;standing Pt Will Perform Lower Body Bathing: with modified independence;sit to/from stand  OT Frequency: Min 2X/week       AM-PAC OT "6 Clicks" Daily Activity     Outcome Measure Help from another person eating meals?: None Help from another person taking care of personal grooming?: A Little Help from another person toileting, which includes using toliet, bedpan, or urinal?: A Little Help from another person bathing (including washing, rinsing, drying)?: A Little Help from another person to put on and taking off regular upper body clothing?: None Help from another person to put on and taking off regular lower body clothing?: A Little 6 Click Score: 20   End of  Session Equipment Utilized During Treatment: Oxygen (3L) Nurse Communication: Mobility status  Activity Tolerance: Patient tolerated treatment well Patient left: in bed;with call bell/phone within reach;with nursing/sitter in room;with family/visitor present  OT Visit Diagnosis: Unsteadiness on feet (R26.81);History of falling (Z91.81)                Time: 4782-9562 OT Time Calculation (min): 34 min Charges:  OT General Charges $OT Visit: 1 Visit OT Evaluation $OT Eval Moderate Complexity: 1 Mod OT  Treatments $Self Care/Home Management : 23-37 mins  Fredirick Maudlin, OTR/L [place]

## 2022-01-28 NOTE — ED Notes (Signed)
Pt off bpap and to 5 LNC to take meds and eat.

## 2022-01-28 NOTE — Assessment & Plan Note (Addendum)
Nicotine patch ordered in the hospital and upon discharge.  She needs to stop smoking.

## 2022-01-28 NOTE — Assessment & Plan Note (Signed)
BMI 30.11

## 2022-01-28 NOTE — Assessment & Plan Note (Deleted)
Hemoglobin 9.4.  We will check hemoglobin tomorrow morning along with iron studies.

## 2022-01-28 NOTE — Assessment & Plan Note (Addendum)
[place] antibiotics of Rocephin and Zithromax were prescribed during the hospital course with an elevated procalcitonin.  We will switch over to [facility] and Zithromax upon disposition

## 2022-01-28 NOTE — Assessment & Plan Note (Deleted)
Echocardiogram pending.  On IV Lasix.  BNP elevated at 721.

## 2022-01-29 ENCOUNTER — Inpatient Hospital Stay: Payer: Medicare Other

## 2022-01-29 DIAGNOSIS — Z794 Long term (current) use of insulin: Secondary | ICD-10-CM

## 2022-01-29 DIAGNOSIS — E119 Type 2 diabetes mellitus without complications: Secondary | ICD-10-CM

## 2022-01-29 DIAGNOSIS — E872 Acidosis, unspecified: Secondary | ICD-10-CM

## 2022-01-29 DIAGNOSIS — J9621 Acute and chronic respiratory failure with hypoxia: Secondary | ICD-10-CM

## 2022-01-29 DIAGNOSIS — D509 Iron deficiency anemia, unspecified: Secondary | ICD-10-CM

## 2022-01-29 DIAGNOSIS — E669 Obesity, unspecified: Secondary | ICD-10-CM

## 2022-01-29 DIAGNOSIS — J9622 Acute and chronic respiratory failure with hypercapnia: Secondary | ICD-10-CM

## 2022-01-29 DIAGNOSIS — R778 Other specified abnormalities of plasma proteins: Secondary | ICD-10-CM

## 2022-01-29 DIAGNOSIS — I5023 Acute on chronic systolic (congestive) heart failure: Secondary | ICD-10-CM

## 2022-01-29 DIAGNOSIS — J441 Chronic obstructive pulmonary disease with (acute) exacerbation: Secondary | ICD-10-CM

## 2022-01-29 LAB — ECHOCARDIOGRAM COMPLETE
AR max vel: 2.16 cm2
AV Area VTI: 2 cm2
AV Area mean vel: 2.21 cm2
AV Mean grad: 7 mmHg
AV Peak grad: 11.2 mmHg
Ao pk vel: 1.67 m/s
Area-P 1/2: 7.51 cm2
Calc EF: 35.1 %
Height: 63 in
MV VTI: 2.15 cm2
S' Lateral: 3.91 cm
Single Plane A2C EF: 38.5 %
Single Plane A4C EF: 37.4 %
Weight: 2720 oz

## 2022-01-29 LAB — GLUCOSE, CAPILLARY
Glucose-Capillary: 146 mg/dL — ABNORMAL HIGH (ref 70–99)
Glucose-Capillary: 152 mg/dL — ABNORMAL HIGH (ref 70–99)
Glucose-Capillary: 137 mg/dL — ABNORMAL HIGH (ref 70–99)
Glucose-Capillary: 145 mg/dL — ABNORMAL HIGH (ref 70–99)
Glucose-Capillary: 230 mg/dL — ABNORMAL HIGH (ref 70–99)

## 2022-01-29 LAB — BASIC METABOLIC PANEL
Anion gap: 7 (ref 5–15)
BUN: 15 mg/dL (ref 8–23)
CO2: 32 mmol/L (ref 22–32)
Calcium: 8 mg/dL — ABNORMAL LOW (ref 8.9–10.3)
Chloride: 100 mmol/L (ref 98–111)
Creatinine, Ser: 1.04 mg/dL — ABNORMAL HIGH (ref 0.44–1.00)
GFR, Estimated: 56 mL/min — ABNORMAL LOW (ref >60)
Glucose, Bld: 155 mg/dL — ABNORMAL HIGH (ref 70–99)
Potassium: 3 mmol/L — ABNORMAL LOW (ref 3.5–5.1)
Sodium: 139 mmol/L (ref 135–145)

## 2022-01-29 LAB — CBC
HCT: 30.5 % — ABNORMAL LOW (ref 36.0–46.0)
Hemoglobin: 8.9 g/dL — ABNORMAL LOW (ref 12.0–15.0)
MCH: 25.8 pg — ABNORMAL LOW (ref 26.0–34.0)
MCHC: 29.2 g/dL — ABNORMAL LOW (ref 30.0–36.0)
MCV: 88.4 fL (ref 80.0–100.0)
Platelets: 239 10*3/uL (ref 150–400)
RBC: 3.45 MIL/uL — ABNORMAL LOW (ref 3.87–5.11)
RDW: 18.6 % — ABNORMAL HIGH (ref 11.5–15.5)
WBC: 9.3 10*3/uL (ref 4.0–10.5)
nRBC: 0.2 % (ref 0.0–0.2)

## 2022-01-29 LAB — FERRITIN: Ferritin: 9 ng/mL — ABNORMAL LOW (ref 11–307)

## 2022-01-29 LAB — IRON AND TIBC
Iron: 23 ug/dL — ABNORMAL LOW (ref 28–170)
Saturation Ratios: 6 % — ABNORMAL LOW (ref 10.4–31.8)
TIBC: 400 ug/dL (ref 250–450)
UIBC: 377 ug/dL

## 2022-01-29 LAB — LACTIC ACID, PLASMA: Lactic Acid, Venous: 0.7 mmol/L (ref 0.5–1.9)

## 2022-01-29 LAB — PROCALCITONIN: Procalcitonin: 13.66 ng/mL

## 2022-01-29 MED ORDER — AZITHROMYCIN 250 MG PO TABS: ORAL_TABLET | ORAL | 0 refills | Status: DC

## 2022-01-29 MED ORDER — CEFDINIR 300 MG PO CAPS
300.0000 mg | ORAL_CAPSULE | Freq: Two times a day (BID) | ORAL | 0 refills | Status: AC
Start: 2022-01-30 — End: 2022-02-02

## 2022-01-29 MED ORDER — PREDNISONE 10 MG PO TABS: ORAL_TABLET | ORAL | 0 refills | Status: DC

## 2022-01-29 MED ORDER — POTASSIUM CHLORIDE CRYS ER 20 MEQ PO TBCR: 20.0000 meq | EXTENDED_RELEASE_TABLET | Freq: Every day | ORAL | 0 refills | Status: DC

## 2022-01-29 MED ORDER — POTASSIUM CHLORIDE CRYS ER 20 MEQ PO TBCR
40.0000 meq | EXTENDED_RELEASE_TABLET | Freq: Once | ORAL | Status: AC
  Administered 2022-01-29: 14:00:00 40 meq via ORAL
  Filled 2022-01-29: qty 2

## 2022-01-29 MED ORDER — MOMETASONE FURO-FORMOTEROL FUM 100-5 MCG/ACT IN AERO: 2.0000 | INHALATION_SPRAY | Freq: Two times a day (BID) | RESPIRATORY_TRACT | 0 refills | Status: DC

## 2022-01-29 MED ORDER — POTASSIUM CHLORIDE CRYS ER 20 MEQ PO TBCR
40.0000 meq | EXTENDED_RELEASE_TABLET | Freq: Once | ORAL | Status: AC
  Administered 2022-01-29: 40 meq via ORAL
  Filled 2022-01-29: qty 2

## 2022-01-29 MED ORDER — INSULIN GLARGINE 100 UNIT/ML SOLOSTAR PEN: 30.0000 [IU] | PEN_INJECTOR | Freq: Every day | SUBCUTANEOUS | 0 refills | Status: DC

## 2022-01-29 MED ORDER — INSULIN PEN NEEDLE 34G X 3.5 MM MISC
1.0000 | Freq: Every day | 0 refills | Status: DC
Start: 2022-01-29 — End: 2024-07-08

## 2022-01-29 MED ORDER — NICOTINE 14 MG/24HR TD PT24: MEDICATED_PATCH | TRANSDERMAL | 0 refills | Status: DC

## 2022-01-29 MED ORDER — SODIUM CHLORIDE 0.9 % IV SOLN
300.0000 mg | Freq: Once | INTRAVENOUS | Status: AC
  Administered 2022-01-29: 11:00:00 300 mg via INTRAVENOUS
  Filled 2022-01-29: qty 300

## 2022-01-29 MED ORDER — FUROSEMIDE 20 MG PO TABS: 20.0000 mg | ORAL_TABLET | Freq: Every day | ORAL | 0 refills | Status: DC

## 2022-01-29 MED ORDER — ALBUTEROL SULFATE HFA 108 (90 BASE) MCG/ACT IN AERS: 2.0000 | INHALATION_SPRAY | Freq: Four times a day (QID) | RESPIRATORY_TRACT | 0 refills | Status: DC | PRN

## 2022-01-29 NOTE — Clinical Social Work Note (Signed)
Kelsey Mcdowell presents with acute on chronic respiratory failure with hypoxia and hypercapnia in the setting of COPD. The use of the NIV will treat patient's high PC02 levels (64 with elevated Bicarbonate of 30.1 while using BiPAP during current hospital stay) and can reduce risk of exacerbations and future hospitalizations when used at night and during the day.  BiPAP (F8101 and [id number]) has been proven ineffective to provide essential volume control necessary to maintain acceptable CO2 levels as ABG's demonstrate consistent hypercapnia despite best efforts with using BiPAP.  An NIV with IVAPS is necessary to prevent patient from life-threatening harm.  Interruption or failure to provide NIV would quickly lead to exacerbation of the patient's condition, hospital admission, and likely harm to the patient. Furthermore, [employer]'s Breathe A Little Easier Program has shown an 85.1% readmission reduction locally for COPD patients.  Continued use is preferred.  Patient is able to protect their airways and clear secretions on their own.

## 2022-01-29 NOTE — Discharge Summary (Signed)
Physician Discharge Summary   Patient: Kelsey Mcdowell MRN: [id number] DOB: November 30, 1946  Admit date:     01/27/2022  Discharge date: 01/29/22  Discharge Physician: Loletha Grayer   PCP: Lynnell Jude, MD   Recommendations at discharge:   Follow-up PCP 5 days Refer to pulmonology as outpatient Referred to CHF clinic.  Discharge Diagnoses: Principal Problem:   Acute on chronic respiratory failure with hypoxia and hypercapnia (HCC) Active Problems:   COPD with acute exacerbation (HCC)   Acute on chronic systolic CHF (congestive heart failure) (HCC)   Elevated troponin   DM II (diabetes mellitus, type II), controlled (HCC)   Hypomagnesemia   Tobacco abuse   Obesity (BMI 30-39.9)   Anemia, unspecified   Elevated procalcitonin   Lactic acidosis   Iron deficiency anemia   Hospital Course: The patient was admitted to the hospital on 01/28/2022 and was feeling much better by 01/29/2022.  Patient was admitted with acute hypoxic hypercapnic respiratory failure.  PCO2 was 62 on presentation and required BiPAP for respiratory distress.  Patient was given IV Lasix and IV Solu-Medrol and improved.  Lungs are clear upon disposition.  I believe this was more COPD exacerbation rather than CHF.  Transitional care team working on noninvasive ventilation at night.  The patient is a candidate for 24/7 oxygen with pulse ox of 76% on room air.  With ambulation she was able to hold her sats at 2 L.  Patient was feeling much better and wanted to go home.  Patient was advised that she must wear the oxygen 24/7.  Assessment and Plan: * Acute on chronic respiratory failure with hypoxia and hypercapnia (HCC) Patient came in initially with a pulse ox of 70%.  The patient does have oxygen at home but only wears it as needed.  The patient initially required BiPAP to blow off CO2 and to oxygenate.  Patient was feeling much better and wanted to go home.  Transitional care team to set up NIV as outpatient.   The patient did have a pulse ox of 76% on room air but was able to hold her saturations at 95% on 2 L with ambulation.  Patient was discharged home on 2 L continuous oxygen 24/7.  COPD with acute exacerbation ([place]) The patient was given Solu-Medrol while here in the hospital and will be given prednisone for 3 more days upon disposition  Acute on chronic systolic CHF (congestive heart failure) ([place]) Patient was diuresed with Lasix 20 mg IV twice a day.  Patient was feeling much better I prescribed Lasix 20 mg daily upon disposition.  Patient will go back on her lisinopril.  No beta-blocker with bronchospasm initially.  Hopefully can start as outpatient.  Patient initially had hypotension so we will hold off on Aldactone at this point.  Referral to CHF clinic.  Elevated troponin Secondary to demand ischemia with acute hypoxic hypercarbic respiratory failure  DM II (diabetes mellitus, type II), controlled ([place]) I prescribed Lantus insulin 30 units daily.  Discontinued her NPH insulin.  Hypomagnesemia- (present on admission) Replaced on admission  Tobacco abuse- (present on admission) Nicotine patch ordered in the hospital and upon discharge.  She needs to stop smoking.  Iron deficiency anemia Hemoglobin 8.9 upon disposition.  Ferritin low at 9.  I did give IV Venofer while here in the hospital.  Can consider GI work-up as outpatient.  The patient did have slight blood-tinged sputum and patient stated that this has improved.    Lactic acidosis Started empiric antibiotics  with Rocephin and Zithromax here in the hospital and I will discharge on Omnicef and Zithromax.  Elevated procalcitonin Par[place] antibiotics of Rocephin and Zithromax were prescribed during the hospital course with an elevated procalcitonin.  We will switch over to Omnicef and Zithromax upon disposition  Obesity (BMI 30-39.9) BMI 30.11           Consultants: None Procedures performed: None Disposition: Home Diet  recommendation:  Cardiac and Carb modified diet  DISCHARGE MEDICATION: Allergies as of 01/29/2022       Reactions   Amoxicillin Other (See Comments)   Other Reaction: GI upset, cramps        Medication List     STOP taking these medications    insulin NPH Human 100 UNIT/ML injection Commonly known as: NOVOLIN N   triamcinolone ointment 0.1 % Commonly known as: KENALOG       TAKE these medications    albuterol 108 (90 Base) MCG/ACT inhaler Commonly known as: VENTOLIN HFA Inhale 2 puffs into the lungs every 6 (six) hours as needed for wheezing or shortness of breath.   ARIPiprazole 2 MG tablet Commonly known as: ABILIFY Take 2 mg by mouth daily.   azithromycin 250 MG tablet Commonly known as: ZITHROMAX One tab po daily for three days Start taking on: January 30, 2022   cefdinir 300 MG capsule Commonly known as: OMNICEF Take 1 capsule (300 mg total) by mouth 2 (two) times daily for 3 days. Start taking on: January 30, 2022   furosemide 20 MG tablet Commonly known as: LASIX Take 1 tablet (20 mg total) by mouth daily.   gabapentin 300 MG capsule Commonly known as: NEURONTIN Take 600-900 mg by mouth See admin instructions. Take 3 capsules (900mg ) by mouth every morning and take 2 capsules (600mg ) by mouth every night   insulin glargine 100 UNIT/ML Solostar Pen Commonly known as: LANTUS Inject 30 Units into the skin daily.   Insulin Pen Needle 34G X 3.5 MM Misc 1 Dose by Does not apply route daily.   lisinopril 40 MG tablet Commonly known as: ZESTRIL Take 40 mg by mouth daily.   metFORMIN 500 MG tablet Commonly known as: GLUCOPHAGE Take 1,000 mg by mouth 2 (two) times daily with a meal.   mometasone-formoterol 100-5 MCG/ACT Aero Commonly known as: DULERA Inhale 2 puffs into the lungs 2 (two) times daily.   nicotine 14 mg/24hr patch Commonly known as: NICODERM CQ - dosed in mg/24 hours Apply one patch daily chest wall (okay to substiute generic)    nortriptyline 25 MG capsule Commonly known as: PAMELOR Take 75 mg by mouth at bedtime.   omeprazole 20 MG capsule Commonly known as: PRILOSEC Take 20 mg by mouth daily.   potassium chloride SA 20 MEQ tablet Commonly known as: KLOR-CON M Take 1 tablet (20 mEq total) by mouth daily.   predniSONE 10 MG tablet Commonly known as: DELTASONE 4 tablets p.o. daily for 3 days Start taking on: January 30, 2022   sertraline 50 MG tablet Commonly known as: ZOLOFT Take 50 mg by mouth daily.               Durable Medical Equipment  (From admission, onward)           Start     Ordered   01/29/22 1453  For home use only DME Pulse oximeter  Once       Comments: Overnight oximetry on 2 L of oxygen   01/29/22 1452   01/29/22 1201  For home use only DME Bipap  Once       Comments: Ins pressure 14:  back up rate 12  Question Answer Comment  Length of Need Lifetime   Bleed in oxygen (LPM) 2 L   Keep 02 saturation 88% or above   Inspiratory pressure OTHER SEE COMMENTS   Expiratory pressure 8      01/29/22 1202   01/29/22 1028  For home use only DME oxygen  Once       Question Answer Comment  Length of Need Lifetime   Mode or (Route) Nasal cannula   Liters per Minute 2   Frequency Continuous (stationary and portable oxygen unit needed)   Oxygen conserving device Yes   Oxygen delivery system Gas      01/29/22 1028            Follow-up Information     Lynnell Jude, MD. Go on 02/04/2022.   Specialty: Family Medicine Why: Monday 12:00 pm Contact information: [place] [address] [phone]         Ottie Glazier, MD Follow up in 2 week(s).   Specialty: Pulmonary Disease Contact information: [place] [address] [phone]                 Discharge Exam: Danley Danker Weights   01/27/22 2258  Weight: 77.1 kg   Physical Exam HENT:     Head: Normocephalic.     Mouth/Throat:     Pharynx: No oropharyngeal  exudate.  Eyes:     General: Lids are normal.     Conjunctiva/sclera: Conjunctivae normal.  Cardiovascular:     Rate and Rhythm: Normal rate and regular rhythm.     Heart sounds: Normal heart sounds, S1 normal and S2 normal.  Pulmonary:     Breath sounds: No decreased breath sounds, wheezing, rhonchi or rales.  Abdominal:     Palpations: Abdomen is soft.     Tenderness: There is no abdominal tenderness.  Musculoskeletal:     Right lower leg: Swelling present.     Left lower leg: Swelling present.  Skin:    General: Skin is warm.     Comments: Chronic lower extremity skin discoloration  Neurological:     Mental Status: She is alert and oriented to person, place, and time.     Condition at discharge: stable  The results of significant diagnostics from this hospitalization (including imaging, microbiology, ancillary and laboratory) are listed below for reference.   Imaging Studies: US Venous Img Lower Bilateral (DVT)  Result Date: 01/29/2022 CLINICAL DATA:  Bilateral leg pain EXAM: BILATERAL LOWER EXTREMITY VENOUS DOPPLER ULTRASOUND TECHNIQUE: Gray-scale sonography with compression, as well as color and duplex ultrasound, were performed to evaluate the deep venous system(s) from the level of the common femoral vein through the popliteal and proximal calf veins. COMPARISON:  None. FINDINGS: VENOUS Normal compressibility of the common femoral, superficial femoral, and popliteal veins, as well as the visualized calf veins. Visualized portions of profunda femoral vein and great saphenous vein unremarkable. No filling defects to suggest DVT on grayscale or color Doppler imaging. Doppler waveforms show normal direction of venous flow, normal respiratory plasticity and response to augmentation. Limited views of the contralateral common femoral vein are unremarkable. OTHER None. Limitations: none IMPRESSION: Negative. Electronically Signed   By: Rolm Baptise M.D.   On: 01/29/2022 02:20   DG Chest  Portable 1 View  Result Date: 01/27/2022 CLINICAL DATA:  CHF, COPD. EXAM: PORTABLE  CHEST 1 VIEW COMPARISON:  11/13/2021 FINDINGS: Cardiomegaly. Bilateral airspace disease, likely edema/CHF. No visible effusions. No acute bony abnormality. IMPRESSION: CHF. Electronically Signed   By: Rolm Baptise M.D.   On: 01/27/2022 23:41   ECHOCARDIOGRAM COMPLETE  Result Date: 01/29/2022    ECHOCARDIOGRAM REPORT   Patient Name:   [facility] Date of Exam: 01/28/2022 Medical Rec #:  [id number]                 Height:       63.0 in Accession #:    [id number]                Weight:       170.0 lb Date of Birth:  [birth year]                 BSA:          1.805 m Patient Age:    76 years                  BP:           110/61 mmHg Patient Gender: F                         HR:           93 bpm. Exam Location:  [facility] Procedure: 2D Echo, Color Doppler, Cardiac Doppler and Intracardiac            Opacification Agent Indications:     I50.21 congestive heart failure-Acute Systolic  History:         Patient has no prior history of Echocardiogram examinations.                  Signs/Symptoms:Shortness of Breath; Risk Factors:Hypertension,                  Diabetes, Dyslipidemia and Sleep Apnea.  Sonographer:     Charmayne Sheer Referring Phys:  [place] Diagnosing Phys: Neoma Laming  Sonographer Comments: Suboptimal apical window and no subcostal window. IMPRESSIONS  1. Left ventricular ejection fraction, by estimation, is 40 to 45%. The left ventricle has mildly decreased function. The left ventricle demonstrates global hypokinesis. The left ventricular internal cavity size was mildly dilated. There is mild left ventricular hypertrophy. Left ventricular diastolic parameters are consistent with Grade II diastolic dysfunction (pseudonormalization).  2. Right ventricular systolic function is low normal. The right ventricular size is mildly enlarged.  3. Left atrial size was mildly dilated.  4. Right atrial size was mildly  dilated.  5. The mitral valve is normal in structure. No evidence of mitral valve regurgitation. No evidence of mitral stenosis.  6. The aortic valve is normal in structure. Aortic valve regurgitation is not visualized. No aortic stenosis is present.  7. The inferior vena cava is normal in size with greater than 50% respiratory variability, suggesting right atrial pressure of 3 mmHg. FINDINGS  Left Ventricle: Left ventricular ejection fraction, by estimation, is 40 to 45%. The left ventricle has mildly decreased function. The left ventricle demonstrates global hypokinesis. Definity contrast agent was given IV to delineate the left ventricular  endocardial borders. The left ventricular internal cavity size was mildly dilated. There is mild left ventricular hypertrophy. Left ventricular diastolic parameters are consistent with Grade II diastolic dysfunction (pseudonormalization). Right Ventricle: The right ventricular size is mildly enlarged. No increase in right ventricular wall thickness. Right ventricular systolic function is low normal. Left  Atrium: Left atrial size was mildly dilated. Right Atrium: Right atrial size was mildly dilated. Pericardium: There is no evidence of pericardial effusion. Mitral Valve: The mitral valve is normal in structure. No evidence of mitral valve regurgitation. No evidence of mitral valve stenosis. MV peak gradient, 7.4 mmHg. The mean mitral valve gradient is 4.0 mmHg. Tricuspid Valve: The tricuspid valve is normal in structure. Tricuspid valve regurgitation is not demonstrated. No evidence of tricuspid stenosis. Aortic Valve: The aortic valve is normal in structure. Aortic valve regurgitation is not visualized. No aortic stenosis is present. Aortic valve mean gradient measures 7.0 mmHg. Aortic valve peak gradient measures 11.2 mmHg. Aortic valve area, by VTI measures 2.00 cm. Pulmonic Valve: The pulmonic valve was normal in structure. Pulmonic valve regurgitation is not visualized.  No evidence of pulmonic stenosis. Aorta: The aortic root is normal in size and structure. Venous: The inferior vena cava is normal in size with greater than 50% respiratory variability, suggesting right atrial pressure of 3 mmHg. IAS/Shunts: The interatrial septum was not assessed.  LEFT VENTRICLE PLAX 2D LVIDd:         4.90 cm      Diastology LVIDs:         3.91 cm      LV e' lateral:   5.00 cm/s LV PW:         1.17 cm      LV E/e' lateral: 22.2 LV IVS:        0.92 cm LVOT diam:     2.00 cm LV SV:         71 LV SV Index:   39 LVOT Area:     3.14 cm  LV Volumes (MOD) LV vol d, MOD A2C: 98.9 ml LV vol d, MOD A4C: 106.0 ml LV vol s, MOD A2C: 60.8 ml LV vol s, MOD A4C: 66.4 ml LV SV MOD A2C:     38.1 ml LV SV MOD A4C:     106.0 ml LV SV MOD BP:      36.0 ml LEFT ATRIUM             Index LA diam:        4.10 cm 2.27 cm/m LA Vol (A2C):   50.7 ml 28.10 ml/m LA Vol (A4C):   39.5 ml 21.89 ml/m LA Biplane Vol: 45.2 ml 25.05 ml/m  AORTIC VALVE                     PULMONIC VALVE AV Area (Vmax):    2.16 cm      PV Vmax:       1.16 m/s AV Area (Vmean):   2.21 cm      PV Vmean:      77.100 cm/s AV Area (VTI):     2.00 cm      PV VTI:        0.229 m AV Vmax:           167.00 cm/s   PV Peak grad:  5.4 mmHg AV Vmean:          122.000 cm/s  PV Mean grad:  3.0 mmHg AV VTI:            0.353 m AV Peak Grad:      11.2 mmHg AV Mean Grad:      7.0 mmHg LVOT Vmax:         115.00 cm/s LVOT Vmean:        86.000 cm/s LVOT  VTI:          0.225 m LVOT/AV VTI ratio: 0.64  AORTA Ao Root diam: 3.10 cm MITRAL VALVE MV Area (PHT): 7.51 cm     SHUNTS MV Area VTI:   2.15 cm     Systemic VTI:  0.22 m MV Peak grad:  7.4 mmHg     Systemic Diam: 2.00 cm MV Mean grad:  4.0 mmHg MV Vmax:       1.36 m/s MV Vmean:      98.8 cm/s MV Decel Time: 101 msec MV E velocity: 111.00 cm/s MV A velocity: 112.00 cm/s MV E/A ratio:  0.99 Shaukat Khan Electronically signed by Neoma Laming Signature Date/Time: 01/29/2022/12:38:25 PM    Final      Microbiology: Results for orders placed or performed during the hospital encounter of 01/27/22  Resp Panel by RT-PCR (Flu A&B, Covid) Nasopharyngeal Swab     Status: None   Collection Time: 01/27/22 10:57 PM   Specimen: Nasopharyngeal Swab; Nasopharyngeal(NP) swabs in vial transport medium  Result Value Ref Range Status   SARS Coronavirus 2 by RT PCR NEGATIVE NEGATIVE Final    Comment: (NOTE) SARS-CoV-2 target nucleic acids are NOT DETECTED.  The SARS-CoV-2 RNA is generally detectable in upper respiratory specimens during the acute phase of infection. The lowest concentration of SARS-CoV-2 viral copies this assay can detect is 138 copies/mL. A negative result does not preclude SARS-Cov-2 infection and should not be used as the sole basis for treatment or other patient management decisions. A negative result may occur with  improper specimen collection/handling, submission of specimen other than nasopharyngeal swab, presence of viral mutation(s) within the areas targeted by this assay, and inadequate number of viral copies(<138 copies/mL). A negative result must be combined with clinical observations, patient history, and epidemiological information. The expected result is Negative.  Fact Sheet for Patients:  EntrepreneurPulse.com.au  Fact Sheet for Healthcare Providers:  IncredibleEmployment.be  This test is no t yet approved or cleared by the [place] FDA and  has been authorized for detection and/or diagnosis of SARS-CoV-2 by FDA under an Emergency Use Authorization (EUA). This EUA will remain  in effect (meaning this test can be used) for the duration of the COVID-19 declaration under Section 564(b)(1) of the Act, 21 U.S.C.section 360bbb-3(b)(1), unless the authorization is terminated  or revoked sooner.       Influenza A by PCR NEGATIVE NEGATIVE Final   Influenza B by PCR NEGATIVE NEGATIVE Final    Comment: (NOTE) The Xpert  Xpress SARS-CoV-2/FLU/RSV plus assay is intended as an aid in the diagnosis of influenza from Nasopharyngeal swab specimens and should not be used as a sole basis for treatment. Nasal washings and aspirates are unacceptable for Xpert Xpress SARS-CoV-2/FLU/RSV testing.  Fact Sheet for Patients: EntrepreneurPulse.com.au  Fact Sheet for Healthcare Providers: IncredibleEmployment.be  This test is not yet approved or cleared by the [place] FDA and has been authorized for detection and/or diagnosis of SARS-CoV-2 by FDA under an Emergency Use Authorization (EUA). This EUA will remain in effect (meaning this test can be used) for the duration of the COVID-19 declaration under Section 564(b)(1) of the Act, 21 U.S.C. section 360bbb-3(b)(1), unless the authorization is terminated or revoked.  Performed at [facility], [address] [place],  [address]   Blood culture (single)     Status: None (Preliminary result)   Collection Time: 01/27/22 10:57 PM   Specimen: BLOOD LEFT FOREARM  Result Value Ref Range Status  Specimen Description BLOOD LEFT FOREARM  Final   Special Requests   Final    BOTTLES DRAWN AEROBIC AND ANAEROBIC Blood Culture adequate volume   Culture   Final    NO GROWTH < 12 HOURS Performed at [facility], [address] [place], [place] [address]    Report Status PENDING  Incomplete    Labs: CBC: Recent Labs  Lab 01/27/22 2257 01/28/22 0139 01/29/22 0442  WBC 11.3* 10.0 9.3  NEUTROABS 6.4  --   --   HGB 10.3* 9.4* 8.9*  HCT 35.4* 31.8* 30.5*  MCV 90.3 89.6 88.4  PLT 311 247 903   Basic Metabolic Panel: Recent Labs  Lab 01/27/22 2257 01/28/22 0139 01/29/22 0442  NA 138 137 139  K 3.5 3.6 3.0*  CL 99 102 100  CO2 30 27 32  GLUCOSE 320* 335* 155*  BUN 11 11 15   CREATININE 1.03* 0.95 1.04*  CALCIUM 8.3* 7.8* 8.0*  MG 1.3*  --   --    Liver Function Tests: Recent Labs  Lab  01/27/22 2257  AST 29  ALT 17  ALKPHOS 61  BILITOT 0.6  PROT 8.0  ALBUMIN 3.0*   CBG: Recent Labs  Lab 01/29/22 0803 01/29/22 0905 01/29/22 1148 01/29/22 1236 01/29/22 1547  GLUCAP 146* 152* 137* 145* 230*    Discharge time spent: greater than 30 minutes.  Signed: Loletha Grayer, MD [facility] 01/29/2022

## 2022-01-29 NOTE — Assessment & Plan Note (Signed)
Hemoglobin 8.9 upon disposition.  Ferritin low at 9.  I did give IV Venofer while here in the hospital.  Can consider GI work-up as outpatient.  The patient did have slight blood-tinged sputum and patient stated that this has improved.

## 2022-01-29 NOTE — TOC Progression Note (Signed)
Transition of Care [facility]) - Progression Note    Patient Details  Name: Kelsey Mcdowell MRN: [id number] Date of Birth: [birth year]  Transition of Care [facility]) CM/SW [place], LCSW Phone Number: 01/29/2022, 3:27 PM  Clinical Narrative:  CSW spoke with [employer]. Pt is states she sent her tanks back. Jeneen Rinks with [employer] is stating they are not showing any return on tanks. Pt is promising she doesn't have them that months ago someone from [place] came and got them because she wasn't using them. CSW notified Jeneen Rinks with [employer] again and he is to check it out. Jeneen Rinks with [employer] states a delievery truck will be brining pt a transport tank in the next two house. Room number provided, RN aware. RN please call Jeneen Rinks at [place] [phone] if tank is not brought in two house.   [employer] is aware pt is dc today and is going to address the tanks or lack there of at home.         Expected Discharge Plan and Services           Expected Discharge Date: 01/29/22                                     Social Determinants of Health (SDOH) Interventions    Readmission Risk Interventions No flowsheet data found.

## 2022-01-29 NOTE — TOC Progression Note (Addendum)
Transition of Care [facility]) - Progression Note    Patient Details  Name: Kelsey Mcdowell MRN: [id number] Date of Birth: 1946-09-12  Transition of Care [facility]) CM/SW Contact  Eileen Stanford, LCSW Phone Number: 01/29/2022, 11:12 AM  Clinical Narrative:  CSW confirmed with pt that she was on 2L at home and the 02 was provided by [place]. CSW confirmed with Jeneen Rinks with [employer] that her tank should be able to provide 4L as well. Pt's is also aware of referral for NIV provided to [facility] with [employer] and agreeable. Pt states her Niece will be picking her up.         Expected Discharge Plan and Services           Expected Discharge Date: 01/29/22                                     Social Determinants of Health (SDOH) Interventions    Readmission Risk Interventions No flowsheet data found.

## 2022-01-29 NOTE — Progress Notes (Signed)
Patient tolerated cpap overnight.

## 2022-01-29 NOTE — Progress Notes (Signed)
Discharge instructions given to and reviewed with patient and her niece Acupuncturist. Both patient and her niece verbalized understanding of all discharge instructions including follow up appointments, new prescriptions and heart failure education. Patient left unit by wheelchair on home o2 with this RN and and her niece at side. Discharged home on home oxygen with niece driving.

## 2022-01-29 NOTE — Assessment & Plan Note (Signed)
Patient was diuresed with Lasix 20 mg IV twice a day.  Patient was feeling much better I prescribed Lasix 20 mg daily upon disposition.  Patient will go back on her lisinopril.  No beta-blocker with bronchospasm initially.  Hopefully can start as outpatient.  Patient initially had hypotension so we will hold off on Aldactone at this point.  Referral to CHF clinic.

## 2022-01-29 NOTE — Assessment & Plan Note (Signed)
Patient came in initially with a pulse ox of 70%.  The patient does have oxygen at home but only wears it as needed.  The patient initially required BiPAP to blow off CO2 and to oxygenate.  Patient was feeling much better and wanted to go home.  Transitional care team to set up NIV as outpatient.  The patient did have a pulse ox of 76% on room air but was able to hold her saturations at 95% on 2 L with ambulation.  Patient was discharged home on 2 L continuous oxygen 24/7.

## 2022-01-29 NOTE — Evaluation (Signed)
Physical Therapy Evaluation Patient Details Name: Kelsey Mcdowell MRN: [id number] DOB: Jan 08, 1946 Today's Date: 01/29/2022  History of Present Illness  Pt is a 76 y.o. female presenting to hospital 2/5 with respiratory distress and increased swelling to LE's.  Pt admitted with acute respiratory failure with hypoxia and hypercapnia.  Mild troponin elevation attributed to demand ischemia.  PMH includes obesity, htn, OSA, DM, h/o smoking.  Clinical Impression  Prior to hospital admission, pt was independent with ambulation; lives alone in 1 level home.  Currently pt is modified independent with bed mobility; independent with transfers; and SBA ambulating 190 feet x2 no AD use.  Pt's O2 sats 76% on room air with short ambulation distance in room (from 90% at rest on room air); O2 sats 99% on 4 L O2 after 190 feet of ambulation and then after sitting rest break, pt's O2 sats 95% or greater on 2 L via nasal cannula ambulating 190 feet.  Pt would benefit from skilled PT to address noted impairments and functional limitations (see below for any additional details).  Upon hospital discharge, no further PT needs anticipated.    SaO2 on room air at rest = 90% SaO2 on room air while ambulating = 76% SaO2 on 2 liters of O2 while ambulating = 95%   Recommendations for follow up therapy are one component of a multi-disciplinary discharge planning process, led by the attending physician.  Recommendations may be updated based on patient status, additional functional criteria and insurance authorization.  Follow Up Recommendations No PT follow up    Assistance Recommended at Discharge PRN  Patient can return home with the following       Equipment Recommendations None recommended by PT  Recommendations for Other Services       Functional Status Assessment Patient has had a recent decline in their functional status and demonstrates the ability to make significant improvements in function in a  reasonable and predictable amount of time.     Precautions / Restrictions Precautions Precautions: Fall Restrictions Weight Bearing Restrictions: No      Mobility  Bed Mobility Overal bed mobility: Modified Independent             General bed mobility comments: HOB elevated; no difficulties noted    Transfers Overall transfer level: Independent Equipment used: None Transfers: Sit to/from Stand Sit to Stand: Independent           General transfer comment: steady safe transfers noted    Ambulation/Gait Ambulation/Gait assistance: Supervision Gait Distance (Feet):  (25 feet x2; 190 feet x2) Assistive device: None Gait Pattern/deviations: Step-through pattern Gait velocity: mildly decreased     General Gait Details: step through gait pattern; steady  Financial trader Rankin (Stroke Patients Only)       Balance Overall balance assessment: Modified Independent Sitting-balance support: No upper extremity supported, Feet supported Sitting balance-Leahy Scale: Normal Sitting balance - Comments: steady sitting reaching outside BOS   Standing balance support: No upper extremity supported Standing balance-Leahy Scale: Normal Standing balance comment: steady standing reaching outside BOS                             Pertinent Vitals/Pain Pain Assessment Pain Assessment: No/denies pain HR 100-115 bpm during sessions activities.    Home Living Family/patient expects to be discharged to:: Private residence Living Arrangements: Alone Available Help at Discharge:  Family;Available PRN/intermittently Type of Home: House Home Access: Stairs to enter Entrance Stairs-Rails: Right;Left;Can reach both Entrance Stairs-Number of Steps: 3 (front), 4 (back door).   Home Layout: One level (1 step down from washroom to kitchen) Home Equipment: Kasandra Knudsen - single point;Wheelchair - Publishing copy (2 wheels);Shower seat  - built in;Hand held shower head Additional Comments: sleeps in recliner    Prior Function Prior Level of Function : Independent/Modified Independent             Mobility Comments: Independent ambulating without AD.  1 fall off of couch about 1 month ago (fell asleep sitting on couch). ADLs Comments: Per OT eval 01/28/22 "Independent with ADLs. drives (within town), and cooks"     Journalist, newspaper        Extremity/Trunk Assessment   Upper Extremity Assessment Upper Extremity Assessment: Overall WFL for tasks assessed    Lower Extremity Assessment Lower Extremity Assessment: Overall WFL for tasks assessed RLE Sensation: decreased light touch;history of peripheral neuropathy    Cervical / Trunk Assessment Cervical / Trunk Assessment: Normal  Communication   Communication: No difficulties  Cognition Arousal/Alertness: Awake/alert Behavior During Therapy: WFL for tasks assessed/performed Overall Cognitive Status: Within Functional Limits for tasks assessed                                          General Comments  Nursing cleared pt for participation in physical therapy.  Pt agreeable to PT session.  Pt on nasal cannula upon PT arriving to pt's room (4 L) but pt's O2 sats noted to decrease from 90% at rest to 76% after returning from toileting.  Therapist checked pt's O2 tubing (d/t having multiple O2 extensions in order to reach bathroom) and therapist noted it was not attached to wall O2 (O2 tubing that was attached to wall O2 was coming from BiPAP machine) so therapist attached pt's O2 tubing to wall O2 with quick improvement to O2 sats of 96% or greater--MD and nurse notified of this.     Exercises     Assessment/Plan    PT Assessment Patient needs continued PT services  PT Problem List Decreased activity tolerance;Decreased mobility;Cardiopulmonary status limiting activity       PT Treatment Interventions Gait training;Stair training;Functional  mobility training;Therapeutic activities;Therapeutic exercise;Balance training;Patient/family education    PT Goals (Current goals can be found in the Care Plan section)  Acute Rehab PT Goals Patient Stated Goal: to go home PT Goal Formulation: With patient Time For Goal Achievement: 02/12/22 Potential to Achieve Goals: Good    Frequency Min 2X/week     Co-evaluation               AM-PAC PT "6 Clicks" Mobility  Outcome Measure Help needed turning from your back to your side while in a flat bed without using bedrails?: None Help needed moving from lying on your back to sitting on the side of a flat bed without using bedrails?: None Help needed moving to and from a bed to a chair (including a wheelchair)?: None Help needed standing up from a chair using your arms (e.g., wheelchair or bedside chair)?: None Help needed to walk in hospital room?: A Little Help needed climbing 3-5 steps with a railing? : A Little 6 Click Score: 22    End of Session Equipment Utilized During Treatment: Oxygen Activity Tolerance: Patient tolerated treatment well Patient left: in bed;with  call bell/phone within reach Nurse Communication: Mobility status;Precautions PT Visit Diagnosis: Other abnormalities of gait and mobility (R26.89)    Time: 4383-8184 PT Time Calculation (min) (ACUTE ONLY): 37 min   Charges:   PT Evaluation $PT Eval Low Complexity: 1 Low PT Treatments $Therapeutic Exercise: 8-22 mins      Leitha Bleak, PT 01/29/22, 10:48 AM

## 2022-01-29 NOTE — Discharge Instructions (Addendum)
Weight yourself daily and keep a log

## 2022-02-01 LAB — CULTURE, BLOOD (SINGLE)
Culture: NO GROWTH
Special Requests: ADEQUATE

## 2022-02-14 NOTE — Progress Notes (Deleted)
°   Patient ID: Kelsey Mcdowell, female    DOB: 01-07-1946, 76 y.o.   MRN: [id number]  HPI  Kelsey Mcdowell is a 76 y/o female with a history of  Echo report from 01/28/22 reviewed and showed and EF of 40-45% along with mild LAE.   Admitted 01/27/22 due to shortness of breath due to COPD/HF exacerbations. Initially given IV lasix/ solumedrol with transition to oral medications. Initially needed bipap but then weaned off of that and down to 2L nasal cannula. Trying to get NIV set up in the home. Elevated troponin thought to be due to demand ischemia. PT/OT consults obtained. Discharged after 2 days.   She presents today for an initial visit with a chief complaint of  Review of Systems    Physical Exam    Assessment & Plan:  1: Chronic heart failure with reduced ejection fraction- - NYHA class - on GDMT of - BNP 01/27/22 was 721.9  2: HTN- - BP - saw PCP (Kelsey Mcdowell) 06/04/20 - BMP 01/29/22 reviewed and showed sodium 139, potassium 3.0, creatinine 1.04 & GFR 56  3: COPD- - saw pulmonology Kelsey Mcdowell) 02/12/22  4: DM- - A1c 01/28/22 was 7.9%

## 2022-02-15 ENCOUNTER — Ambulatory Visit: Payer: Medicare Other | Admitting: Family

## 2022-02-26 ENCOUNTER — Encounter: Payer: Self-pay | Admitting: Family

## 2022-02-26 ENCOUNTER — Ambulatory Visit: Payer: Medicare Other | Attending: Family | Admitting: Family

## 2022-02-26 ENCOUNTER — Telehealth: Payer: Self-pay | Admitting: Family

## 2022-02-26 ENCOUNTER — Other Ambulatory Visit
Admission: RE | Admit: 2022-02-26 | Discharge: 2022-02-26 | Disposition: A | Discharge status: Home / Self Care | Payer: Medicare Other | Source: Ambulatory Visit | Attending: Family | Admitting: Family

## 2022-02-26 ENCOUNTER — Encounter: Payer: Self-pay | Admitting: Pharmacist

## 2022-02-26 ENCOUNTER — Other Ambulatory Visit: Payer: Self-pay

## 2022-02-26 VITALS — BP 118/62 | HR 96 | Resp 18 | Ht 60.0 in | Wt 181.0 lb

## 2022-02-26 DIAGNOSIS — R0602 Shortness of breath: Secondary | ICD-10-CM | POA: Diagnosis present

## 2022-02-26 DIAGNOSIS — I5022 Chronic systolic (congestive) heart failure: Secondary | ICD-10-CM | POA: Diagnosis not present

## 2022-02-26 DIAGNOSIS — Z87891 Personal history of nicotine dependence: Secondary | ICD-10-CM | POA: Diagnosis not present

## 2022-02-26 DIAGNOSIS — I10 Essential (primary) hypertension: Secondary | ICD-10-CM

## 2022-02-26 DIAGNOSIS — I110 Hypertensive heart disease with heart failure: Secondary | ICD-10-CM | POA: Diagnosis not present

## 2022-02-26 DIAGNOSIS — J449 Chronic obstructive pulmonary disease, unspecified: Secondary | ICD-10-CM | POA: Insufficient documentation

## 2022-02-26 DIAGNOSIS — E669 Obesity, unspecified: Secondary | ICD-10-CM | POA: Insufficient documentation

## 2022-02-26 DIAGNOSIS — R059 Cough, unspecified: Secondary | ICD-10-CM | POA: Insufficient documentation

## 2022-02-26 DIAGNOSIS — G4733 Obstructive sleep apnea (adult) (pediatric): Secondary | ICD-10-CM | POA: Insufficient documentation

## 2022-02-26 DIAGNOSIS — E119 Type 2 diabetes mellitus without complications: Secondary | ICD-10-CM | POA: Diagnosis not present

## 2022-02-26 DIAGNOSIS — R5383 Other fatigue: Secondary | ICD-10-CM | POA: Insufficient documentation

## 2022-02-26 LAB — BASIC METABOLIC PANEL
Anion gap: 8 (ref 5–15)
BUN: 17 mg/dL (ref 8–23)
CO2: 26 mmol/L (ref 22–32)
Calcium: 8.7 mg/dL — ABNORMAL LOW (ref 8.9–10.3)
Chloride: 105 mmol/L (ref 98–111)
Creatinine, Ser: 1.14 mg/dL — ABNORMAL HIGH (ref 0.44–1.00)
GFR, Estimated: 50 mL/min — ABNORMAL LOW (ref >60)
Glucose, Bld: 217 mg/dL — ABNORMAL HIGH (ref 70–99)
Potassium: 4.8 mmol/L (ref 3.5–5.1)
Sodium: 139 mmol/L (ref 135–145)

## 2022-02-26 MED ORDER — SACUBITRIL-VALSARTAN 24-26 MG PO TABS: 1.0000 | ORAL_TABLET | Freq: Two times a day (BID) | ORAL | 3 refills | Status: DC

## 2022-02-26 MED ORDER — POTASSIUM CHLORIDE CRYS ER 20 MEQ PO TBCR: 20.0000 meq | EXTENDED_RELEASE_TABLET | Freq: Every day | ORAL | 3 refills | Status: DC

## 2022-02-26 NOTE — Progress Notes (Unsigned)
[facility] COUNSELING NOTE ? ?Guideline-Directed Medical Therapy/Evidence Based Medicine ? ?ACE/ARB/ARNI: Lisinopril 40 mg daily ?Beta Blocker:  none ?Aldosterone Antagonist:  none ?Diuretic: Furosemide 20 mg daily ?SGLT2i:  none ? ?Adherence Assessment ? ?Do you ever forget to take your medication? '[]'$ Yes ?'[x]'$ No  ?Do you ever skip doses due to side effects? '[]'$ Yes ?'[x]'$ No  ?Do you have trouble affording your medicines? '[]'$ Yes ?'[x]'$ No  ?Are you ever unable to pick up your medication due to transportation difficulties? '[]'$ Yes ?'[x]'$ No  ?Do you ever stop taking your medications because you don't believe they are helping? '[]'$ Yes ?'[x]'$ No  ?Do you check your weight daily? '[]'$ Yes ?'[x]'$ No  ? ?Adherence strategy: pill box ? ?Barriers to obtaining medications: none ? ?Vital signs: HR 96, BP 149/68, weight (pounds) 181 lbs ?ECHO: Date 01/28/2022, EF 40-45%, LV with mildly decreased function, global hypokinesis, and mildly dilated. There is mild left ventricular hypertrophy. ? ?BMP Latest Ref Rng & Units 01/29/2022 01/28/2022 01/27/2022  ?Glucose 70 - 99 mg/dL 155(H) 335(H) 320(H)  ?BUN 8 - 23 mg/dL '15 11 11  '$ ?Creatinine 0.44 - 1.00 mg/dL 1.04(H) 0.95 1.03(H)  ?Sodium 135 - 145 mmol/L 139 137 138  ?Potassium 3.5 - 5.1 mmol/L 3.0(L) 3.6 3.5  ?Chloride 98 - 111 mmol/L 100 102 99  ?CO2 22 - 32 mmol/L 32 27 30  ?Calcium 8.9 - 10.3 mg/dL 8.0(L) 7.8(L) 8.3(L)  ? ? ?Past Medical History:  ?Diagnosis Date  ? Arthritis   ? Diabetes mellitus without complication ([place])   ? GERD (gastroesophageal reflux disease)   ? Hyperlipemia   ? Hypertension   ? Neuropathy   ? Sleep apnea   ? ? ?ASSESSMENT ?76 year old female who presents to the HF clinic to establish and new patient. PMH includes DM, HLD, HTN, COPD on oxygen,and sleep apnea. Noted last ED visit was 01/27/2022 for COPD exacerbation and acute HF exacerbation. First appointment with cardiologist still pending and needs to start GDMT for HFrEF  management. ? ?PLAN ?Recommend to change Lisinopril to Entresto 49-'51mg'$  BID ?Decrease sodium in diet and start daily wt log. ? ?Time spent: 20 minutes ? ?Mattia Osterman Rodriguez-Guzman PharmD, BCPS ?02/26/2022 11:12 AM ? ? ?Current Outpatient Medications:  ?  albuterol (VENTOLIN HFA) 108 (90 Base) MCG/ACT inhaler, Inhale 2 puffs into the lungs every 6 (six) hours as needed for wheezing or shortness of breath., Disp: 8 g, Rfl: 0 ?  ALPRAZolam (XANAX) 0.25 MG tablet, Take 0.25 mg by mouth daily at 6 (six) AM., Disp: , Rfl:  ?  ARIPiprazole (ABILIFY) 2 MG tablet, Take 2 mg by mouth daily., Disp: , Rfl:  ?  furosemide (LASIX) 20 MG tablet, Take 1 tablet (20 mg total) by mouth daily. (Patient taking differently: Take 40 mg by mouth daily.), Disp: 30 tablet, Rfl: 0 ?  gabapentin (NEURONTIN) 300 MG capsule, Take 600-900 mg by mouth See admin instructions. Take 3 capsules ('900mg'$ ) by mouth every morning and take 2 capsules ('600mg'$ ) by mouth every night, Disp: , Rfl:  ?  insulin glargine (LANTUS) 100 UNIT/ML Solostar Pen, Inject 30 Units into the skin daily., Disp: 15 mL, Rfl: 0 ?  Insulin Pen Needle 34G X 3.5 MM MISC, 1 Dose by Does not apply route daily., Disp: 100 each, Rfl: 0 ?  lisinopril (PRINIVIL,ZESTRIL) 40 MG tablet, Take 40 mg by mouth daily., Disp: , Rfl:  ?  metFORMIN (GLUCOPHAGE) 500 MG tablet, Take 1,000 mg by mouth 2 (two) times daily with a  meal., Disp: , Rfl:  ?  mometasone-formoterol (DULERA) 100-5 MCG/ACT AERO, Inhale 2 puffs into the lungs 2 (two) times daily., Disp: 1 each, Rfl: 0 ?  Multiple Vitamin (MULTIVITAMIN WITH MINERALS) TABS tablet, Take 1 tablet by mouth daily., Disp: , Rfl:  ?  nicotine (NICODERM CQ - DOSED IN MG/24 HOURS) 14 mg/24hr patch, Apply one patch daily chest wall (okay to substiute generic), Disp: 28 patch, Rfl: 0 ?  nortriptyline (PAMELOR) 25 MG capsule, Take 75 mg by mouth at bedtime., Disp: , Rfl:  ?  omeprazole (PRILOSEC) 20 MG capsule, Take 20 mg by mouth daily., Disp: , Rfl:  ?  potassium  chloride SA (KLOR-CON M) 20 MEQ tablet, Take 1 tablet (20 mEq total) by mouth daily., Disp: 30 tablet, Rfl: 0 ?  sertraline (ZOLOFT) 50 MG tablet, Take 50 mg by mouth daily., Disp: , Rfl:  ?  vitamin B-12 (CYANOCOBALAMIN) 1000 MCG tablet, Take 1,000 mcg by mouth daily., Disp: , Rfl:  ? ?MEDICATION ADHERENCES TIPS AND STRATEGIES ?Taking medication as prescribed improves patient outcomes in heart failure (reduces hospitalizations, improves symptoms, increases survival) ?Side effects of medications can be managed by decreasing doses, switching agents, stopping drugs, or adding additional therapy. Please let someone in the [facility] know if you have having bothersome side effects so we can modify your regimen. Do not alter your medication regimen without talking to [place].  ?Medication reminders can help patients remember to take drugs on time. If you are missing or forgetting doses you can try linking behaviors, using pill boxes, or an electronic reminder like an alarm on your phone or an app. Some people can also get automated phone calls as medication reminders.  ? ?

## 2022-02-26 NOTE — Progress Notes (Signed)
Patient:  Kelsey Mcdowell  MRN: [id number]  DOB: 05/09/1973  ? ?Kelsey Mcdowell is a 76 yo with a PMHx of COPD, acute on chronic systolic CHF, DM II, tobacco abuse, obesity, and HTN. ? ?Recently admitted 2/5-01/29/22 for respiratory failure requiring BiPAP, IV lasix and IV solu-medrol, felt to be more COPD exacerbation than HF exacerbation. She was discharged back home on O2. ? ?Echo report from 01/28/22 reviewed and showed: EF 40-45% LV with mildly decreased function and global hypokinesis with mild LVH.  ? ?She presents today for her initial visit with a chief complaint of fatigue and SOB. She describes this as chronic in nature and is slightly improved following her discharge from the hospital. Along with this she has a cough, sleeping difficulties, and pedal edema. She denies CP, palpitations, dizziness, abdominal swelling or weight gain.  ? ?Past Medical History:  ?Diagnosis Date  ? Arthritis   ? Diabetes mellitus without complication ([place])   ? GERD (gastroesophageal reflux disease)   ? Hyperlipemia   ? Hypertension   ? Neuropathy   ? Sleep apnea   ? ?Past Surgical History:  ?Procedure Laterality Date  ? ABDOMINAL HYSTERECTOMY    ? APPENDECTOMY    ? BREAST EXCISIONAL BIOPSY Left 1970's  ? benign  ? CHOLECYSTECTOMY    ? COLONOSCOPY WITH PROPOFOL N/A 09/30/2018  ? Procedure: COLONOSCOPY WITH PROPOFOL;  Surgeon: Toledo, Benay Pike, MD;  Location: [facility] ENDOSCOPY;  Service: Gastroenterology;  Laterality: N/A;  ? [place]  ? cervical fusion  ? ?Family History  ?Problem Relation Age of Onset  ? Cancer Mother   ? Hypertension Mother   ? Diabetes Mother   ? Heart disease Father   ? Diabetes Father   ? Cancer Sister   ? Breast cancer Sister 23  ? Heart disease Brother   ? ?Social History  ? ?Tobacco Use  ? Smoking status: Former  ? Smokeless tobacco: Never  ?Substance Use Topics  ? Alcohol use: Yes  ?  Comment: occasional  ? ?Allergies  ?Allergen Reactions  ? Amoxicillin Other (See Comments)  ?  Other  Reaction: GI upset, cramps  ? ?Prior to Admission medications   ?Medication Sig Start Date End Date Taking? Authorizing Provider  ?albuterol (VENTOLIN HFA) 108 (90 Base) MCG/ACT inhaler Inhale 2 puffs into the lungs every 6 (six) hours as needed for wheezing or shortness of breath. 01/29/22   Loletha Grayer, MD  ?ARIPiprazole (ABILIFY) 2 MG tablet Take 2 mg by mouth daily. 11/17/21   [provider]  ?furosemide (LASIX) 20 MG tablet Take 1 tablet (20 mg total) by mouth daily. 01/29/22   Loletha Grayer, MD  ?gabapentin (NEURONTIN) 300 MG capsule Take 600-900 mg by mouth See admin instructions. Take 3 capsules ('900mg'$ ) by mouth every morning and take 2 capsules ('600mg'$ ) by mouth every night    [provider]  ?insulin glargine (LANTUS) 100 UNIT/ML Solostar Pen Inject 30 Units into the skin daily. 01/29/22   Loletha Grayer, MD  ?Insulin Pen Needle 34G X 3.5 MM MISC 1 Dose by Does not apply route daily. 01/29/22   Loletha Grayer, MD  ?lisinopril (PRINIVIL,ZESTRIL) 40 MG tablet Take 40 mg by mouth daily.    [provider]  ?metFORMIN (GLUCOPHAGE) 500 MG tablet Take 1,000 mg by mouth 2 (two) times daily with a meal.    [provider]  ?mometasone-formoterol (DULERA) 100-5 MCG/ACT AERO Inhale 2 puffs into the lungs 2 (two) times daily. 01/29/22   Wieting, Richard,  MD  ?nicotine (NICODERM CQ - DOSED IN MG/24 HOURS) 14 mg/24hr patch Apply one patch daily chest wall (okay to substiute generic) 01/29/22   Loletha Grayer, MD  ?nortriptyline (PAMELOR) 25 MG capsule Take 75 mg by mouth at bedtime.    [provider]  ?omeprazole (PRILOSEC) 20 MG capsule Take 20 mg by mouth daily. 11/17/21   [provider]  ?potassium chloride SA (KLOR-CON M) 20 MEQ tablet Take 1 tablet (20 mEq total) by mouth daily. 01/29/22   Loletha Grayer, MD  ?sertraline (ZOLOFT) 50 MG tablet Take 50 mg by mouth daily. 11/17/21   [provider]  ? ? Review of Systems  ?Constitutional:  Positive  for malaise/fatigue.  ?HENT:  Positive for hearing loss.   ?Eyes:  Positive for blurred vision. Negative for double vision.  ?Respiratory:  Positive for cough, shortness of breath and wheezing. Negative for hemoptysis and sputum production.   ?Cardiovascular:  Positive for leg swelling. Negative for chest pain, palpitations and orthopnea.  ?Gastrointestinal: Negative.   ?Genitourinary: Negative.   ?Musculoskeletal: Negative.   ?Skin:  Positive for rash (RLL).  ?Neurological:  Negative for dizziness and weakness.  ?Endo/Heme/Allergies: Negative.   ?Psychiatric/Behavioral: Negative.     ? ?Physical Exam ?Vitals and nursing note reviewed. Exam conducted with a chaperone present (niece).  ?Constitutional:   ?   General: She is not in acute distress. ?   Appearance: Normal appearance.  ?Cardiovascular:  ?   Rate and Rhythm: Normal rate and regular rhythm.  ?   Pulses: Normal pulses.  ?   Heart sounds: Normal heart sounds.  ?Pulmonary:  ?   Effort: Pulmonary effort is normal.  ?   Breath sounds: No stridor. No wheezing, rhonchi or rales.  ?Abdominal:  ?   Palpations: Abdomen is soft.  ?Musculoskeletal:     ?   General: Tenderness (both lower legs) present.  ?   Right lower leg: Edema (+1) present.  ?   Left lower leg: Edema (+1) present.  ?Neurological:  ?   General: No focal deficit present.  ?   Mental Status: She is alert and oriented to person, place, and time.  ?Psychiatric:     ?   Mood and Affect: Mood normal.     ?   Thought Content: Thought content normal.  ?  ?Lab Results  ?Component Value Date  ? CREATININE 1.04 (H) 01/29/2022  ? CREATININE 0.95 01/28/2022  ? CREATININE 1.03 (H) 01/27/2022  ?  ?Vitals with BMI 02/26/2022 01/29/2022 01/29/2022  ?Height '5\' 0"'$  - -  ?Weight 181 lbs - -  ?BMI 35.35 - -  ?Systolic 449 675 -  ?Diastolic 68 83 -  ?Pulse 96 110 100  ?  ?Filed Weights  ? 02/26/22 1024  ?Weight: 181 lb (82.1 kg)  ?  ?Assessment and Plan: ? ?Heart failure with mildly reduced ejection fraction ?- NYHA class II ?-  euvolemic ?- had not been weighing; explained importance and parameters for when to call  ?- provided low sodium cookbook and discussed importance of 2g/day sodium intake ?- not watching her fluid intake, advised ~ 64 ounces of all fluid/day ?- plan to start entresto 24/26 mg BID ?- provided coupon for free 30-day entresto ?- discussed parameter to stop lisinopril and allow time to wash out prior to taking entresto ?- discussed other GDMT to be added in the future ?- needs to get established with cardiologist, prefers [facility], advised to call and schedule appt (pt thought their office would  reach out to her) ?- discussed wearing compression socks ?- BNP 01/27/22 721.9 ?- pharmD reconciled medications ?- will check BMP today ? ?Hypertension ?- BP elevated today 149/68, rechecked 118/62 ?- will stop lisinopril and transition to entresto ? ?COPD ?- saw pulmonologist Raul Del) 02/12/22 ?- stopped smoking at hospital admission and has not resumed ?- on O2 24/7, 2 lpm ? ?OSA ?- has OSA, supposed to wear CPAP but sent it back several years ago ? ? ?Return in 1 month or sooner if needed. ? ?Medications reviewed with pharmD.  ? ? ? ?

## 2022-02-26 NOTE — Telephone Encounter (Signed)
Agree with NP student's assessment of BMP results.  ?

## 2022-02-26 NOTE — Patient Instructions (Addendum)
Contact Dr. Saralyn Pilar office today to get established. ? ?4/25, you have a GI appt with Dr. Alice Reichert. ? ?Weigh daily. ? ?Start taking entresto Thursday morning.  ? ?Do NOT TAKE LISINOPRIL Wednesday.  ? ?Call our office if you have a gain of 3 lbs/night or 5 lbs/week. ? ?2,000 mg sodium day. ? ?64 ounces of total fluids/day. ? ? ? ? ?

## 2022-03-29 ENCOUNTER — Ambulatory Visit: Payer: Medicare Other | Admitting: Family

## 2022-04-10 ENCOUNTER — Ambulatory Visit: Payer: Medicare Other | Admitting: Family

## 2022-04-12 ENCOUNTER — Ambulatory Visit: Payer: Medicare Other | Admitting: Family

## 2022-04-12 NOTE — Progress Notes (Deleted)
   Patient ID: Kelsey Mcdowell, female    DOB: 1946/11/15, 76 y.o.   MRN: [id number]  HPI  Cay Kath is a 76 yo with a PMHx of COPD, acute on chronic systolic CHF, DM II, tobacco abuse, obesity, and HTN.  Echo report from 01/28/22 reviewed and showed: EF 40-45% LV with mildly decreased function and global hypokinesis with mild LVH.   Admitted 2/5-01/29/22 for respiratory failure requiring BiPAP, IV lasix and IV solu-medrol, felt to be more COPD exacerbation than HF exacerbation. She was discharged back home on O2.  She presents today for a follow-up visit with a chief complaint of  Review of Systems    Physical Exam    Assessment and Plan:  Heart failure with mildly reduced ejection fraction - NYHA class II - euvolemic - had not been weighing; explained importance and parameters for when to call  - weight 181 pounds from last visit here 6 weeks ago - not watching her fluid intake, advised ~ 64 ounces of all fluid/day - on GDMT of  - check BMP since entresto started at last visit - saw cardiology ([facility]) 03/15/22 - discussed wearing compression socks - BNP 01/27/22 721.9  Hypertension - BP  - saw PCP (Bliss) - BMP 02/26/22 reviewed and showed sodium 139, potassium 4.8, creatinine 1.14 and GFR 50  COPD - saw pulmonologist Raul Del) 03/13/22 - stopped smoking at hospital admission and has not resumed - on O2 24/7, 2 lpm  OSA - has OSA, supposed to wear CPAP but sent it back several years ago

## 2022-04-25 ENCOUNTER — Encounter: Payer: Self-pay | Admitting: Family

## 2022-04-25 ENCOUNTER — Other Ambulatory Visit
Admission: RE | Admit: 2022-04-25 | Discharge: 2022-04-25 | Disposition: A | Discharge status: Home / Self Care | Payer: Medicare Other | Source: Ambulatory Visit | Attending: Family | Admitting: Family

## 2022-04-25 ENCOUNTER — Ambulatory Visit (HOSPITAL_BASED_OUTPATIENT_CLINIC_OR_DEPARTMENT_OTHER): Payer: Medicare Other | Admitting: Family

## 2022-04-25 ENCOUNTER — Telehealth: Payer: Self-pay | Admitting: Family

## 2022-04-25 VITALS — BP 129/55 | HR 91 | Resp 16 | Ht 60.0 in | Wt 183.2 lb

## 2022-04-25 DIAGNOSIS — Z794 Long term (current) use of insulin: Secondary | ICD-10-CM

## 2022-04-25 DIAGNOSIS — J441 Chronic obstructive pulmonary disease with (acute) exacerbation: Secondary | ICD-10-CM | POA: Insufficient documentation

## 2022-04-25 DIAGNOSIS — F17210 Nicotine dependence, cigarettes, uncomplicated: Secondary | ICD-10-CM | POA: Insufficient documentation

## 2022-04-25 DIAGNOSIS — E1140 Type 2 diabetes mellitus with diabetic neuropathy, unspecified: Secondary | ICD-10-CM | POA: Insufficient documentation

## 2022-04-25 DIAGNOSIS — Z9981 Dependence on supplemental oxygen: Secondary | ICD-10-CM | POA: Insufficient documentation

## 2022-04-25 DIAGNOSIS — E669 Obesity, unspecified: Secondary | ICD-10-CM | POA: Insufficient documentation

## 2022-04-25 DIAGNOSIS — I5022 Chronic systolic (congestive) heart failure: Secondary | ICD-10-CM | POA: Insufficient documentation

## 2022-04-25 DIAGNOSIS — E1122 Type 2 diabetes mellitus with diabetic chronic kidney disease: Secondary | ICD-10-CM

## 2022-04-25 DIAGNOSIS — J449 Chronic obstructive pulmonary disease, unspecified: Secondary | ICD-10-CM

## 2022-04-25 DIAGNOSIS — M7989 Other specified soft tissue disorders: Secondary | ICD-10-CM | POA: Insufficient documentation

## 2022-04-25 DIAGNOSIS — R5383 Other fatigue: Secondary | ICD-10-CM | POA: Insufficient documentation

## 2022-04-25 DIAGNOSIS — I110 Hypertensive heart disease with heart failure: Secondary | ICD-10-CM | POA: Insufficient documentation

## 2022-04-25 DIAGNOSIS — N1831 Chronic kidney disease, stage 3a: Secondary | ICD-10-CM

## 2022-04-25 DIAGNOSIS — I10 Essential (primary) hypertension: Secondary | ICD-10-CM

## 2022-04-25 DIAGNOSIS — Z79899 Other long term (current) drug therapy: Secondary | ICD-10-CM | POA: Insufficient documentation

## 2022-04-25 DIAGNOSIS — R609 Edema, unspecified: Secondary | ICD-10-CM | POA: Insufficient documentation

## 2022-04-25 LAB — BASIC METABOLIC PANEL
Anion gap: 10 (ref 5–15)
BUN: 26 mg/dL — ABNORMAL HIGH (ref 8–23)
CO2: 26 mmol/L (ref 22–32)
Calcium: 9.3 mg/dL (ref 8.9–10.3)
Chloride: 98 mmol/L (ref 98–111)
Creatinine, Ser: 1.59 mg/dL — ABNORMAL HIGH (ref 0.44–1.00)
GFR, Estimated: 34 mL/min — ABNORMAL LOW (ref >60)
Glucose, Bld: 353 mg/dL — ABNORMAL HIGH (ref 70–99)
Potassium: 4.6 mmol/L (ref 3.5–5.1)
Sodium: 134 mmol/L — ABNORMAL LOW (ref 135–145)

## 2022-04-25 NOTE — Telephone Encounter (Signed)
LM regarding BMP results obtained earlier today. Renal function has declined some since starting entresto but glucose levels are rising as well.  ? ?Earlier today at clinic visit, she mentioned she had gotten a glucose at home in the 600's but was unsure if the meter was having issues. She is going to switch out with a brand new meter that she has.  ? ?Glucose on lab today was 353. Advised her to let me know if glucose levels remain elevated as she was wondering if starting the entresto is causing her hyperglycemia. Will plan on rechecking her labs in 1 month.  ? ?Glucose via BMP on 02/26/22 prior to starting entresto was 217 and today is 353. Creatinine on 02/26/22 was 1.24 and today is 1.59 with GFR down from 50 to 34.  ?

## 2022-04-25 NOTE — Patient Instructions (Signed)
Continue weighing daily and call for an overnight weight gain of 3 pounds or more or a weekly weight gain of more than 5 pounds.   If you have voicemail, please make sure your mailbox is cleaned out so that we may leave a message and please make sure to listen to any voicemails.     

## 2022-04-25 NOTE — Progress Notes (Signed)
? Patient ID: Kelsey Mcdowell, female    DOB: Dec 10, 1946, 76 y.o.   MRN: [id number] ? ?HPI ? ?Kelsey Mcdowell is a 76 yo with a PMHx of COPD, acute on chronic systolic CHF, DM II, tobacco abuse, obesity, and HTN. ? ?Echo report from 01/28/22 reviewed and showed: EF 40-45% LV with mildly decreased function and global hypokinesis with mild LVH.  ? ?Admitted 2/5-01/29/22 for respiratory failure requiring BiPAP, IV lasix and IV solu-medrol, felt to be more COPD exacerbation than HF exacerbation. She was discharged back home on O2. ? ?She presents today for a follow-up visit with a chief complaint of moderate fatigue with minimal exertion. She describes this as chronic in nature. She has associated pedal edema (at the end of the day) and fluctuating glucose levels along with this. She denies any dizziness, difficulty sleeping, abdominal distention, palpitations, chest pain, wheezing, shortness of breath, cough or weight gain.  ? ?She says that her fasting glucose this morning was 69 but over the weekend, she got a reading of >600. She was wondering if the entresto was causing the elevated glucose levels. She's also not sure if her glucometer is working properly so she's going to start using a brand new meter that she already has.  ? ?Does notice some swelling in her left leg at the day goes on but she says that it resolves completely overnight. Wearing her oxygen at 2L around the clock.  ? ?Past Medical History:  ?Diagnosis Date  ? Arthritis   ? Diabetes mellitus without complication ([place])   ? GERD (gastroesophageal reflux disease)   ? Hyperlipemia   ? Hypertension   ? Neuropathy   ? Sleep apnea   ? ?Past Surgical History:  ?Procedure Laterality Date  ? ABDOMINAL HYSTERECTOMY    ? APPENDECTOMY    ? BREAST EXCISIONAL BIOPSY Left 1970's  ? benign  ? CHOLECYSTECTOMY    ? COLONOSCOPY WITH PROPOFOL N/A 09/30/2018  ? Procedure: COLONOSCOPY WITH PROPOFOL;  Surgeon: Toledo, Benay Pike, MD;  Location: [facility] ENDOSCOPY;   Service: Gastroenterology;  Laterality: N/A;  ? [place]  ? cervical fusion  ? ?Family History  ?Problem Relation Age of Onset  ? Cancer Mother   ? Hypertension Mother   ? Diabetes Mother   ? Heart disease Father   ? Diabetes Father   ? Cancer Sister   ? Breast cancer Sister 6  ? Heart disease Brother   ? ?Social History  ? ?Tobacco Use  ? Smoking status: Some Days  ?  Types: Cigarettes  ? Smokeless tobacco: Never  ?Substance Use Topics  ? Alcohol use: Yes  ?  Comment: occasional  ? ?Allergies  ?Allergen Reactions  ? Amoxicillin Other (See Comments)  ?  Other Reaction: GI upset, cramps  ? ?Prior to Admission medications   ?Medication Sig Start Date End Date Taking? Authorizing Provider  ?albuterol (VENTOLIN HFA) 108 (90 Base) MCG/ACT inhaler Inhale 2 puffs into the lungs every 6 (six) hours as needed for wheezing or shortness of breath. 01/29/22  Yes Wieting, Richard, MD  ?ALPRAZolam Duanne Moron) 0.25 MG tablet Take 0.25 mg by mouth daily at 6 (six) AM.   Yes [provider]  ?ARIPiprazole (ABILIFY) 2 MG tablet Take 2 mg by mouth daily. 11/17/21  Yes [provider]  ?furosemide (LASIX) 20 MG tablet Take 1 tablet (20 mg total) by mouth daily. ?Patient taking differently: Take 40 mg by mouth daily. 01/29/22  Yes Loletha Grayer, MD  ?gabapentin (NEURONTIN) 300  MG capsule Take 600-900 mg by mouth See admin instructions. Take 3 capsules ('900mg'$ ) by mouth every morning and take 2 capsules ('600mg'$ ) by mouth every night   Yes [provider]  ?insulin glargine (LANTUS) 100 UNIT/ML Solostar Pen Inject 30 Units into the skin daily. 01/29/22  Yes Wieting, Richard, MD  ?Insulin Pen Needle 34G X 3.5 MM MISC 1 Dose by Does not apply route daily. 01/29/22  Yes Wieting, Richard, MD  ?metFORMIN (GLUCOPHAGE) 500 MG tablet Take 1,000 mg by mouth 2 (two) times daily with a meal.   Yes [provider]  ?mometasone-formoterol (DULERA) 100-5 MCG/ACT AERO Inhale 2 puffs into the lungs 2 (two) times  daily. 01/29/22  Yes Wieting, Richard, MD  ?Multiple Vitamin (MULTIVITAMIN WITH MINERALS) TABS tablet Take 1 tablet by mouth daily.   Yes [provider]  ?nortriptyline (PAMELOR) 25 MG capsule Take 75 mg by mouth at bedtime.   Yes [provider]  ?omeprazole (PRILOSEC) 20 MG capsule Take 20 mg by mouth daily. 11/17/21  Yes [provider]  ?potassium chloride SA (KLOR-CON M) 20 MEQ tablet Take 1 tablet (20 mEq total) by mouth daily. 02/26/22  Yes Alisa Graff, FNP  ?sacubitril-valsartan (ENTRESTO) 24-26 MG Take 1 tablet by mouth 2 (two) times daily. 02/26/22  Yes Alisa Graff, FNP  ?sertraline (ZOLOFT) 50 MG tablet Take 50 mg by mouth daily. 11/17/21  Yes [provider]  ?vitamin B-12 (CYANOCOBALAMIN) 1000 MCG tablet Take 1,000 mcg by mouth daily.   Yes [provider]  ? ?Review of Systems  ?Constitutional:  Positive for fatigue (easily). Negative for appetite change.  ?HENT:  Negative for congestion, postnasal drip and sore throat.   ?Eyes: Negative.   ?Respiratory:  Negative for cough, chest tightness, shortness of breath and wheezing.   ?Cardiovascular:  Positive for leg swelling (left leg by the end of the day; resolves overnight). Negative for chest pain and palpitations.  ?Gastrointestinal:  Negative for abdominal distention and abdominal pain.  ?Endocrine: Negative.   ?     Hyperglycemia  ?Genitourinary: Negative.   ?Musculoskeletal:  Negative for back pain and neck pain.  ?Skin: Negative.   ?Allergic/Immunologic: Negative.   ?Neurological:  Negative for dizziness and light-headedness.  ?Hematological:  Negative for adenopathy. Does not bruise/bleed easily.  ?Psychiatric/Behavioral:  Negative for dysphoric mood and sleep disturbance (sleeping on 1 pillow). The patient is not nervous/anxious.   ? ?Vitals:  ? 04/25/22 1409  ?BP: (!) 129/55  ?Pulse: 91  ?Resp: 16  ?SpO2: 98%  ?Weight: 183 lb 4 oz (83.1 kg)  ?Height: 5' (1.524 m)  ? ?Wt Readings from Last 3  Encounters:  ?04/25/22 183 lb 4 oz (83.1 kg)  ?02/26/22 181 lb (82.1 kg)  ?01/27/22 170 lb (77.1 kg)  ? ?Lab Results  ?Component Value Date  ? CREATININE 1.14 (H) 02/26/2022  ? CREATININE 1.04 (H) 01/29/2022  ? CREATININE 0.95 01/28/2022  ? ?Physical Exam ?Vitals and nursing note reviewed. Exam conducted with a chaperone present (friend).  ?Constitutional:   ?   Appearance: Normal appearance.  ?HENT:  ?   Head: Normocephalic and atraumatic.  ?Cardiovascular:  ?   Rate and Rhythm: Normal rate and regular rhythm.  ?Pulmonary:  ?   Effort: Pulmonary effort is normal. No respiratory distress.  ?   Breath sounds: No wheezing or rales.  ?Abdominal:  ?   General: There is no distension.  ?   Palpations: Abdomen is soft.  ?Musculoskeletal:     ?  General: No tenderness.  ?   Cervical back: Normal range of motion and neck supple.  ?   Right lower leg: No edema.  ?   Left lower leg: Edema (trace pitting) present.  ?Neurological:  ?   General: No focal deficit present.  ?   Mental Status: She is alert and oriented to person, place, and time.  ?Psychiatric:     ?   Mood and Affect: Mood normal.     ?   Behavior: Behavior normal.  ? ?Assessment and Plan: ? ?Heart failure with mildly reduced ejection fraction- ?- NYHA class III ?- euvolemic ?- weighing most days but admits to not weighing daily; reviewed the importance of weighing daily and to call for an overnight weight gain of > 2 pounds or a weekly weight gain of > 5 pounds ?- weight up 2 pounds from last visit here 6 weeks ago ?- not watching her fluid intake, advised ~ 64 ounces of all fluid/day ?- on GDMT of entresto ?- check BMP today since entresto started at last visit ?- saw cardiology (Elmo) 03/15/22 ?- left leg swelling resolves overnight ?- BNP 01/27/22 721.9 ? ?Hypertension- ?- BP looks good (129/55) ?- saw PCP (Bliss) ~ 2 weeks ago; returns in 3 months ?- BMP 02/26/22 reviewed and showed sodium 139, potassium 4.8, creatinine 1.14 and GFR 50 ? ?COPD- ?- saw  pulmonologist Raul Del) 03/13/22 ?- has resumed smoking ~ 1/3 ppd of cigarettes; does confirm that she takes off her oxygen and goes outside the home to smoke ?- complete cessation discussed for 3 minutes ?- on O2 24/7, 2 lpm

## 2022-05-28 ENCOUNTER — Ambulatory Visit: Payer: Medicare Other | Attending: Family | Admitting: Family

## 2022-05-28 ENCOUNTER — Encounter: Payer: Self-pay | Admitting: Family

## 2022-05-28 VITALS — BP 150/70 | HR 87 | Resp 16 | Ht 60.0 in | Wt 188.4 lb

## 2022-05-28 DIAGNOSIS — E1140 Type 2 diabetes mellitus with diabetic neuropathy, unspecified: Secondary | ICD-10-CM | POA: Diagnosis not present

## 2022-05-28 DIAGNOSIS — I110 Hypertensive heart disease with heart failure: Secondary | ICD-10-CM | POA: Diagnosis present

## 2022-05-28 DIAGNOSIS — N1831 Chronic kidney disease, stage 3a: Secondary | ICD-10-CM

## 2022-05-28 DIAGNOSIS — E119 Type 2 diabetes mellitus without complications: Secondary | ICD-10-CM | POA: Insufficient documentation

## 2022-05-28 DIAGNOSIS — I5022 Chronic systolic (congestive) heart failure: Secondary | ICD-10-CM | POA: Diagnosis not present

## 2022-05-28 DIAGNOSIS — J441 Chronic obstructive pulmonary disease with (acute) exacerbation: Secondary | ICD-10-CM | POA: Insufficient documentation

## 2022-05-28 DIAGNOSIS — I10 Essential (primary) hypertension: Secondary | ICD-10-CM

## 2022-05-28 DIAGNOSIS — Z794 Long term (current) use of insulin: Secondary | ICD-10-CM

## 2022-05-28 DIAGNOSIS — J449 Chronic obstructive pulmonary disease, unspecified: Secondary | ICD-10-CM | POA: Diagnosis not present

## 2022-05-28 DIAGNOSIS — E1122 Type 2 diabetes mellitus with diabetic chronic kidney disease: Secondary | ICD-10-CM | POA: Diagnosis not present

## 2022-05-28 DIAGNOSIS — F17210 Nicotine dependence, cigarettes, uncomplicated: Secondary | ICD-10-CM | POA: Diagnosis not present

## 2022-05-28 DIAGNOSIS — E669 Obesity, unspecified: Secondary | ICD-10-CM | POA: Insufficient documentation

## 2022-05-28 NOTE — Patient Instructions (Addendum)
Resume weighing daily and call for an overnight weight gain of 3 pounds or more or a weekly weight gain of more than 5 pounds.   If you have voicemail, please make sure your mailbox is cleaned out so that we may leave a message and please make sure to listen to any voicemails.     If you receive a satisfaction survey regarding the [facility], please take the time to fill it out. This way we can continue to provide excellent care and make any changes that need to be made.     Get compression socks and put them on every morning with removal at bedtime.

## 2022-05-28 NOTE — Progress Notes (Signed)
Patient ID: Kelsey Mcdowell, female    DOB: Mar 31, 1946, 76 y.o.   MRN: [id number]  HPI  [name] is a 76 yo with a PMHx of COPD, acute on chronic systolic CHF, DM II, tobacco abuse, obesity, and HTN.  Echo report from 01/28/22 reviewed and showed: EF 40-45% LV with mildly decreased function and global hypokinesis with mild LVH.   Admitted 2/5-01/29/22 for respiratory failure requiring BiPAP, IV lasix and IV solu-medrol, felt to be more COPD exacerbation than HF exacerbation. She was discharged back home on O2.  She presents today for a follow-up visit with a chief complaint of moderate fatigue upon minimal exertion. Describes this as chronic in nature. She has associated shortness of breath, pedal edema and weight gain along with this. She denies any difficulty sleeping, dizziness, abdominal distention, palpitations, chest pain, wheezing or cough.   Not weighing daily but does have working scales. Has not gotten compression socks yet.   Continues to smoke and does take her oxygen off and removes herself from it but doesn't always turns the oxygen off.   Drinking 8 ounce pepsi, 48 ounces of water and 4 cups of coffee daily because her mouth "stays so dry".   Past Medical History:  Diagnosis Date   Arthritis    Diabetes mellitus without complication ([facility])    GERD (gastroesophageal reflux disease)    Hyperlipemia    Hypertension    Neuropathy    Sleep apnea    Past Surgical History:  Procedure Laterality Date   ABDOMINAL HYSTERECTOMY     APPENDECTOMY     BREAST EXCISIONAL BIOPSY Left 1970's   benign   CHOLECYSTECTOMY     COLONOSCOPY WITH PROPOFOL N/A 09/30/2018   Procedure: COLONOSCOPY WITH PROPOFOL;  Surgeon: Toledo, Benay Pike, MD;  Location: [facility] ENDOSCOPY;  Service: Gastroenterology;  Laterality: N/A;   SPINE SURGERY  1998   cervical fusion   Family History  Problem Relation Age of Onset   Cancer Mother    Hypertension Mother    Diabetes Mother    Heart  disease Father    Diabetes Father    Cancer Sister    Breast cancer Sister 68   Heart disease Brother    Social History   Tobacco Use   Smoking status: Some Days    Types: Cigarettes   Smokeless tobacco: Never  Substance Use Topics   Alcohol use: Yes    Comment: occasional   Allergies  Allergen Reactions   Amoxicillin Other (See Comments)    Other Reaction: GI upset, cramps   Prior to Admission medications   Medication Sig Start Date End Date Taking? Authorizing Provider  albuterol (VENTOLIN HFA) 108 (90 Base) MCG/ACT inhaler Inhale 2 puffs into the lungs every 6 (six) hours as needed for wheezing or shortness of breath. 01/29/22  Yes Wieting, Richard, MD  ALPRAZolam Duanne Moron) 0.25 MG tablet Take 0.25 mg by mouth daily at 6 (six) AM.   Yes [provider]  ARIPiprazole (ABILIFY) 2 MG tablet Take 2 mg by mouth daily. 11/17/21  Yes [provider]  furosemide (LASIX) 20 MG tablet Take 1 tablet (20 mg total) by mouth daily. Patient taking differently: Take 40 mg by mouth daily. 01/29/22  Yes Wieting, Richard, MD  gabapentin (NEURONTIN) 300 MG capsule Take 600-900 mg by mouth See admin instructions. Take 2 capsules ('600mg'$ ) by mouth every morning and take 3 capsules ('900mg'$ ) by mouth every night   Yes [provider]  insulin glargine (LANTUS) 100  UNIT/ML Solostar Pen Inject 30 Units into the skin daily. 01/29/22  Yes Wieting, Richard, MD  Insulin Pen Needle 34G X 3.5 MM MISC 1 Dose by Does not apply route daily. 01/29/22  Yes Wieting, Richard, MD  metFORMIN (GLUCOPHAGE) 500 MG tablet Take 1,000 mg by mouth 2 (two) times daily with a meal.   Yes [provider]  mometasone-formoterol (DULERA) 100-5 MCG/ACT AERO Inhale 2 puffs into the lungs 2 (two) times daily. 01/29/22  Yes Wieting, Richard, MD  Multiple Vitamin (MULTIVITAMIN WITH MINERALS) TABS tablet Take 1 tablet by mouth daily.   Yes [provider]  nortriptyline (PAMELOR) 25 MG capsule Take 75 mg by  mouth at bedtime.   Yes [provider]  omeprazole (PRILOSEC) 20 MG capsule Take 20 mg by mouth daily. 11/17/21  Yes [provider]  potassium chloride SA (KLOR-CON M) 20 MEQ tablet Take 1 tablet (20 mEq total) by mouth daily. 02/26/22  Yes Addalie Calles A, FNP  sacubitril-valsartan (ENTRESTO) 24-26 MG Take 1 tablet by mouth 2 (two) times daily. 02/26/22  Yes Camdyn Laden, Otila Kluver A, FNP  sertraline (ZOLOFT) 50 MG tablet Take 50 mg by mouth daily. 11/17/21  Yes [provider]  vitamin B-12 (CYANOCOBALAMIN) 1000 MCG tablet Take 1,000 mcg by mouth daily.   Yes [provider]   Review of Systems  Constitutional:  Positive for fatigue (easily). Negative for appetite change.  HENT:  Negative for congestion, postnasal drip and sore throat.   Eyes: Negative.   Respiratory:  Positive for shortness of breath (minimal). Negative for cough, chest tightness and wheezing.   Cardiovascular:  Positive for leg swelling (worsening). Negative for chest pain and palpitations.  Gastrointestinal:  Negative for abdominal distention and abdominal pain.  Endocrine: Negative.        Hyperglycemia in the evenings  Genitourinary: Negative.   Musculoskeletal:  Negative for back pain and neck pain.  Skin: Negative.   Allergic/Immunologic: Negative.   Neurological:  Negative for dizziness and light-headedness.  Hematological:  Negative for adenopathy. Does not bruise/bleed easily.  Psychiatric/Behavioral:  Negative for dysphoric mood and sleep disturbance (sleeping on 1 pillow). The patient is not nervous/anxious.    Vitals:   05/28/22 1126  BP: (!) 150/70  Pulse: 87  Resp: 16  SpO2: 96%  Weight: 188 lb 6 oz (85.4 kg)  Height: 5' (1.524 m)   Wt Readings from Last 3 Encounters:  05/28/22 188 lb 6 oz (85.4 kg)  04/25/22 183 lb 4 oz (83.1 kg)  02/26/22 181 lb (82.1 kg)   Lab Results  Component Value Date   CREATININE 1.59 (H) 04/25/2022   CREATININE 1.14 (H) 02/26/2022    CREATININE 1.04 (H) 01/29/2022   Physical Exam Vitals and nursing note reviewed. Exam conducted with a chaperone present (niece).  Constitutional:      Appearance: Normal appearance.  HENT:     Head: Normocephalic and atraumatic.  Cardiovascular:     Rate and Rhythm: Normal rate and regular rhythm.  Pulmonary:     Effort: Pulmonary effort is normal. No respiratory distress.     Breath sounds: No wheezing or rales.  Abdominal:     General: There is no distension.     Palpations: Abdomen is soft.  Musculoskeletal:        General: No tenderness.     Cervical back: Normal range of motion and neck supple.     Right lower leg: Edema (1+ pitting) present.     Left lower leg: Edema (  1+ pitting) present.  Neurological:     General: No focal deficit present.     Mental Status: She is alert and oriented to person, place, and time.  Psychiatric:        Mood and Affect: Mood normal.        Behavior: Behavior normal.   Assessment and Plan:  Heart failure with mildly reduced ejection fraction- - NYHA class III - euvolemic - weighing most days but admits to not weighing daily; reviewed the importance of weighing daily and to call for an overnight weight gain of > 2 pounds or a weekly weight gain of > 5 pounds - weight up 5 pounds from last visit here 1 month ago - not watching her fluid intake, advised ~ 64 ounces of all fluid/day; currently drinking over this amount with 8 ounces pepsi, 48 ounces of water and 4 cups of coffee - on GDMT of entresto - consider other GDMT but being mindful that she's had low BP in the past - saw cardiology (Paraschos) 03/15/22 - emphasized getting the compression socks and putting them on every morning with removal at bedtime - BNP 01/27/22 721.9  Hypertension- - BP mildly elevated (150/70) but she hasn't taken her medications yet today - saw PCP Clemmie Krill) ~ 1 month ago; returns in the next couple of weeks and has to have fasting lab work drawn at that time;  will not repeat labs today then - BMP 04/25/22 reviewed and showed sodium 134, potassium 4.6, creatinine 1.59 and GFR 34  COPD- - saw pulmonologist Raul Del) 03/13/22 - smoking ~ 1/3 ppd of cigarettes; does confirm that she takes off her oxygen and goes outside the home to smoke but does not always turn the oxygen completely off; emphasized turning it off anytime she smokes - complete cessation discussed for 3 minutes - on O2 24/7, 2 lpm  DM- - she reports fasting glucose this morning of 78 - currently using steroid eye drops and says that her PM glucose levels tend to run high   Patient did not bring her medications nor a list. Each medication was verbally reviewed with the patient and she was encouraged to bring the bottles to every visit to confirm accuracy of list.   Return in 2 months, sooner if needed.

## 2022-07-08 ENCOUNTER — Encounter: Payer: Self-pay | Admitting: *Deleted

## 2022-07-09 ENCOUNTER — Ambulatory Visit: Payer: Medicare Other | Admitting: Anesthesiology

## 2022-07-09 ENCOUNTER — Ambulatory Visit
Admission: RE | Admit: 2022-07-09 | Discharge: 2022-07-09 | Disposition: A | Discharge status: Home / Self Care | Payer: Medicare Other | Attending: Gastroenterology | Admitting: Gastroenterology

## 2022-07-09 ENCOUNTER — Encounter
Admission: RE | Disposition: A | Discharge status: Home / Self Care | Payer: Self-pay | Source: Home / Self Care | Attending: Gastroenterology

## 2022-07-09 ENCOUNTER — Encounter: Payer: Self-pay | Admitting: *Deleted

## 2022-07-09 DIAGNOSIS — D759 Disease of blood and blood-forming organs, unspecified: Secondary | ICD-10-CM | POA: Insufficient documentation

## 2022-07-09 DIAGNOSIS — K640 First degree hemorrhoids: Secondary | ICD-10-CM | POA: Insufficient documentation

## 2022-07-09 DIAGNOSIS — K31819 Angiodysplasia of stomach and duodenum without bleeding: Secondary | ICD-10-CM | POA: Diagnosis not present

## 2022-07-09 DIAGNOSIS — K219 Gastro-esophageal reflux disease without esophagitis: Secondary | ICD-10-CM | POA: Insufficient documentation

## 2022-07-09 DIAGNOSIS — F32A Depression, unspecified: Secondary | ICD-10-CM | POA: Diagnosis not present

## 2022-07-09 DIAGNOSIS — G4733 Obstructive sleep apnea (adult) (pediatric): Secondary | ICD-10-CM | POA: Diagnosis not present

## 2022-07-09 DIAGNOSIS — Z9981 Dependence on supplemental oxygen: Secondary | ICD-10-CM | POA: Insufficient documentation

## 2022-07-09 DIAGNOSIS — G709 Myoneural disorder, unspecified: Secondary | ICD-10-CM | POA: Diagnosis not present

## 2022-07-09 DIAGNOSIS — I110 Hypertensive heart disease with heart failure: Secondary | ICD-10-CM | POA: Diagnosis not present

## 2022-07-09 DIAGNOSIS — D124 Benign neoplasm of descending colon: Secondary | ICD-10-CM | POA: Insufficient documentation

## 2022-07-09 DIAGNOSIS — J449 Chronic obstructive pulmonary disease, unspecified: Secondary | ICD-10-CM | POA: Insufficient documentation

## 2022-07-09 DIAGNOSIS — Z794 Long term (current) use of insulin: Secondary | ICD-10-CM | POA: Diagnosis not present

## 2022-07-09 DIAGNOSIS — D122 Benign neoplasm of ascending colon: Secondary | ICD-10-CM | POA: Insufficient documentation

## 2022-07-09 DIAGNOSIS — Z7984 Long term (current) use of oral hypoglycemic drugs: Secondary | ICD-10-CM | POA: Diagnosis not present

## 2022-07-09 DIAGNOSIS — Z980 Intestinal bypass and anastomosis status: Secondary | ICD-10-CM | POA: Insufficient documentation

## 2022-07-09 DIAGNOSIS — D509 Iron deficiency anemia, unspecified: Secondary | ICD-10-CM | POA: Diagnosis not present

## 2022-07-09 DIAGNOSIS — I509 Heart failure, unspecified: Secondary | ICD-10-CM | POA: Diagnosis not present

## 2022-07-09 DIAGNOSIS — E119 Type 2 diabetes mellitus without complications: Secondary | ICD-10-CM | POA: Insufficient documentation

## 2022-07-09 DIAGNOSIS — D120 Benign neoplasm of cecum: Secondary | ICD-10-CM | POA: Diagnosis not present

## 2022-07-09 DIAGNOSIS — N289 Disorder of kidney and ureter, unspecified: Secondary | ICD-10-CM | POA: Diagnosis not present

## 2022-07-09 HISTORY — DX: Chronic obstructive pulmonary disease, unspecified: J44.9

## 2022-07-09 HISTORY — DX: Acute myocardial infarction, unspecified: I21.9

## 2022-07-09 HISTORY — PX: COLONOSCOPY WITH PROPOFOL: SHX5780

## 2022-07-09 HISTORY — PX: ESOPHAGOGASTRODUODENOSCOPY (EGD) WITH PROPOFOL: SHX5813

## 2022-07-09 LAB — GLUCOSE, CAPILLARY: Glucose-Capillary: 98 mg/dL (ref 70–99)

## 2022-07-09 SURGERY — COLONOSCOPY WITH PROPOFOL
Anesthesia: General

## 2022-07-09 MED ORDER — ALBUTEROL SULFATE HFA 108 (90 BASE) MCG/ACT IN AERS
INHALATION_SPRAY | RESPIRATORY_TRACT | Status: AC
  Filled 2022-07-09: qty 6.7

## 2022-07-09 MED ORDER — PROPOFOL 500 MG/50ML IV EMUL
INTRAVENOUS | Status: DC | PRN
  Administered 2022-07-09: 40 mg via INTRAVENOUS
  Administered 2022-07-09: 150 ug/kg/min via INTRAVENOUS

## 2022-07-09 MED ORDER — PROPOFOL 1000 MG/100ML IV EMUL
INTRAVENOUS | Status: AC
  Filled 2022-07-09: qty 100

## 2022-07-09 MED ORDER — SODIUM CHLORIDE 0.9 % IV SOLN: INTRAVENOUS | Status: DC

## 2022-07-09 NOTE — Transfer of Care (Signed)
Immediate Anesthesia Transfer of Care Note  Patient: Kelsey Mcdowell  Procedure(s) Performed: COLONOSCOPY WITH PROPOFOL ESOPHAGOGASTRODUODENOSCOPY (EGD) WITH PROPOFOL  Patient Location: PACU  Anesthesia Type:General  Level of Consciousness: awake and alert   Airway & Oxygen Therapy: Patient Spontanous Breathing and Patient connected to nasal cannula oxygen  Post-op Assessment: Report given to RN and Post -op Vital signs reviewed and stable  Post vital signs: Reviewed and stable  Last Vitals:  Vitals Value Taken Time  BP 125/65 07/09/22 0847  Temp 35.7 C 07/09/22 0846  Pulse 71 07/09/22 0848  Resp 19 07/09/22 0848  SpO2 100 % 07/09/22 0848  Vitals shown include unvalidated device data.  Last Pain:  Vitals:   07/09/22 0846  TempSrc: Temporal  PainSc: 0-No pain         Complications: No notable events documented.

## 2022-07-09 NOTE — Anesthesia Preprocedure Evaluation (Addendum)
Anesthesia Evaluation  Patient identified by MRN, date of birth, ID band Patient awake    Reviewed: Allergy & Precautions, NPO status , Patient's Chart, lab work & pertinent test results  Airway Mallampati: III       Dental  (+) Upper Dentures, Lower Dentures   Pulmonary sleep apnea, Continuous Positive Airway Pressure Ventilation and Oxygen sleep apnea , COPD, Current SmokerPatient did not abstain from smoking., former smoker,   admitted 2/5-01/29/22 for respiratory failure requiring BiPAP, IV lasix and IV solu-medrol, felt to be more COPD exacerbation than HF exacerbation. She was discharged back home on O2.   Pulmonary exam normal        Cardiovascular hypertension, Pt. on medications +CHF  Normal cardiovascular exam  ECHO 2/23: 1. Left ventricular ejection fraction, by estimation, is 40 to 45%. The  left ventricle has mildly decreased function. The left ventricle  demonstrates global hypokinesis. The left ventricular internal cavity size  was mildly dilated. There is mild left  ventricular hypertrophy. Left ventricular diastolic parameters are  consistent with Grade II diastolic dysfunction (pseudonormalization).  2. Right ventricular systolic function is low normal. The right  ventricular size is mildly enlarged.  3. Left atrial size was mildly dilated.  4. Right atrial size was mildly dilated.  5. The mitral valve is normal in structure. No evidence of mitral valve  regurgitation. No evidence of mitral stenosis.  6. The aortic valve is normal in structure. Aortic valve regurgitation is  not visualized. No aortic stenosis is present.  7. The inferior vena cava is normal in size with greater than 50%  respiratory variability, suggesting right atrial pressure of 3 mmHg.   Neuro/Psych PSYCHIATRIC DISORDERS Depression  Neuromuscular disease    GI/Hepatic Neg liver ROS, GERD  Medicated,  Endo/Other  diabetes, Well  Controlled, Type 2, Oral Hypoglycemic Agents, Insulin Dependent  Renal/GU Renal InsufficiencyRenal disease  negative genitourinary   Musculoskeletal  (+) Arthritis , Osteoarthritis,    Abdominal (+) + obese,   Peds negative pediatric ROS (+)  Hematology  (+) Blood dyscrasia, anemia ,   Anesthesia Other Findings   Reproductive/Obstetrics                           Anesthesia Physical  Anesthesia Plan  ASA: 3  Anesthesia Plan: General   Post-op Pain Management:    Induction: Intravenous  PONV Risk Score and Plan: Propofol infusion  Airway Management Planned: Simple Face Mask  Additional Equipment:   Intra-op Plan:   Post-operative Plan:   Informed Consent: I have reviewed the patients History and Physical, chart, labs and discussed the procedure including the risks, benefits and alternatives for the proposed anesthesia with the patient or authorized representative who has indicated his/her understanding and acceptance.     Dental advisory given  Plan Discussed with: CRNA and Surgeon  Anesthesia Plan Comments:       Anesthesia Quick Evaluation

## 2022-07-09 NOTE — Op Note (Signed)
[facility] Patient Name: Kelsey Mcdowell Procedure Date: 07/09/2022 7:50 AM MRN: [id number] Account #: [id number] Date of Birth: January 02, 1946 Admit Type: Outpatient Age: 76 Room: [facility] ENDO ROOM 1 Gender: Female Note Status: Finalized Instrument Name: Upper Endoscope [id number] Procedure:             Upper GI endoscopy Indications:           Iron deficiency anemia Providers:             Andrey Farmer MD, MD Referring MD:          Lynnell Jude (Referring MD) Medicines:             Monitored Anesthesia Care Complications:         No immediate complications. Procedure:             Pre-Anesthesia Assessment:                        - Prior to the procedure, a History and Physical was                         performed, and patient medications and allergies were                         reviewed. The patient is competent. The risks and                         benefits of the procedure and the sedation options and                         risks were discussed with the patient. All questions                         were answered and informed consent was obtained.                         Patient identification and proposed procedure were                         verified by the physician, the nurse, the                         anesthesiologist, the anesthetist and the technician                         in the endoscopy suite. Mental Status Examination:                         alert and oriented. Airway Examination: normal                         oropharyngeal airway and neck mobility. Respiratory                         Examination: clear to auscultation. CV Examination:                         normal. Prophylactic Antibiotics: The patient does not  require prophylactic antibiotics. Prior                         Anticoagulants: The patient has taken no previous                         anticoagulant or antiplatelet agents. ASA Grade                          Assessment: III - A patient with severe systemic                         disease. After reviewing the risks and benefits, the                         patient was deemed in satisfactory condition to                         undergo the procedure. The anesthesia plan was to use                         monitored anesthesia care (MAC). Immediately prior to                         administration of medications, the patient was                         re-assessed for adequacy to receive sedatives. The                         heart rate, respiratory rate, oxygen saturations,                         blood pressure, adequacy of pulmonary ventilation, and                         response to care were monitored throughout the                         procedure. The physical status of the patient was                         re-assessed after the procedure.                        After obtaining informed consent, the endoscope was                         passed under direct vision. Throughout the procedure,                         the patient's blood pressure, pulse, and oxygen                         saturations were monitored continuously. The Endoscope                         was introduced through the mouth, and advanced to the  second part of duodenum. The upper GI endoscopy was                         accomplished without difficulty. The patient tolerated                         the procedure well. Findings:      The examined esophagus was normal.      A single 3 mm angiodysplastic lesion with no bleeding was found in the       gastric body.      The exam of the stomach was otherwise normal.      The examined duodenum was normal. Impression:            - Normal esophagus.                        - A single non-bleeding angiodysplastic lesion in the                         stomach. Not treated as it appears hemoglobin has been                          improving and lesion not actively bleeding.                        - Normal examined duodenum.                        - No specimens collected. Recommendation:        - Perform a colonoscopy today. Procedure Code(s):     --- Professional ---                        [phone], Esophagogastroduodenoscopy, flexible,                         transoral; diagnostic, including collection of                         specimen(s) by brushing or washing, when performed                         (separate procedure) Diagnosis Code(s):     --- Professional ---                        N82.956, Angiodysplasia of stomach and duodenum                         without bleeding                        D50.9, Iron deficiency anemia, unspecified CPT copyright 2019 American Medical Association. All rights reserved. The codes documented in this report are preliminary and upon coder review may  be revised to meet current compliance requirements. Andrey Farmer MD, MD 07/09/2022 8:47:03 AM Number of Addenda: 0 Note Initiated On: 07/09/2022 7:50 AM Estimated Blood Loss:  Estimated blood loss: none.      [facility]

## 2022-07-09 NOTE — H&P (Signed)
Outpatient short stay form Pre-procedure 07/09/2022  Kelsey Rubenstein, MD  Primary Physician: Lynnell Jude, MD  Reason for visit:  IDA  History of present illness:    76 y/o lady with multiple medical problems including COPD on 2L and OSA here for EGD/Colonoscopy for IDA. Had a colonoscopy in 2019 with several polyps but has not yet had f/u. History of hysterectomy, appendectomy, and fistula repair.    Current Facility-Administered Medications:    0.9 %  sodium chloride infusion, , Intravenous, Continuous, Delania Ferg, Hilton Cork, MD, Last Rate: 20 mL/hr at 07/09/22 0734, New Bag at 07/09/22 0734  Medications Prior to Admission  Medication Sig Dispense Refill Last Dose   ALPRAZolam (XANAX) 0.25 MG tablet Take 0.25 mg by mouth daily at 6 (six) AM.   07/08/2022   ARIPiprazole (ABILIFY) 2 MG tablet Take 2 mg by mouth daily.   07/08/2022   furosemide (LASIX) 20 MG tablet Take 1 tablet (20 mg total) by mouth daily. (Patient taking differently: Take 40 mg by mouth daily.) 30 tablet 0 07/08/2022   gabapentin (NEURONTIN) 300 MG capsule Take 600-900 mg by mouth See admin instructions. Take 2 capsules ('600mg'$ ) by mouth every morning and take 3 capsules ('900mg'$ ) by mouth every night   07/08/2022   mometasone-formoterol (DULERA) 100-5 MCG/ACT AERO Inhale 2 puffs into the lungs 2 (two) times daily. 1 each 0 07/08/2022   nortriptyline (PAMELOR) 25 MG capsule Take 75 mg by mouth at bedtime.   07/08/2022   omeprazole (PRILOSEC) 20 MG capsule Take 20 mg by mouth daily.   07/08/2022   potassium chloride SA (KLOR-CON M) 20 MEQ tablet Take 1 tablet (20 mEq total) by mouth daily. 90 tablet 3 07/08/2022   sertraline (ZOLOFT) 50 MG tablet Take 50 mg by mouth daily.   07/08/2022   vitamin B-12 (CYANOCOBALAMIN) 1000 MCG tablet Take 1,000 mcg by mouth daily.   07/08/2022   albuterol (VENTOLIN HFA) 108 (90 Base) MCG/ACT inhaler Inhale 2 puffs into the lungs every 6 (six) hours as needed for wheezing or shortness of breath. 8 g  0    insulin glargine (LANTUS) 100 UNIT/ML Solostar Pen Inject 30 Units into the skin daily. 15 mL 0    Insulin Pen Needle 34G X 3.5 MM MISC 1 Dose by Does not apply route daily. 100 each 0    metFORMIN (GLUCOPHAGE) 500 MG tablet Take 1,000 mg by mouth 2 (two) times daily with a meal.      Multiple Vitamin (MULTIVITAMIN WITH MINERALS) TABS tablet Take 1 tablet by mouth daily.      sacubitril-valsartan (ENTRESTO) 24-26 MG Take 1 tablet by mouth 2 (two) times daily. 60 tablet 3      Allergies  Allergen Reactions   Amoxicillin Other (See Comments)    Other Reaction: GI upset, cramps     Past Medical History:  Diagnosis Date   Arthritis    COPD (chronic obstructive pulmonary disease) (HCC)    Diabetes mellitus without complication (HCC)    GERD (gastroesophageal reflux disease)    Hyperlipemia    Hypertension    Myocardial infarction ([place])    light heart attack ?when   Neuropathy    Sleep apnea     Review of systems:  Otherwise negative.    Physical Exam  Gen: Alert, oriented. Appears stated age.  HEENT: PERRLA. Lungs: No respiratory distress CV: RRR Abd: soft, benign, no masses Ext: No edema    Planned procedures: Proceed with EGD/colonoscopy. The patient understands the  nature of the planned procedure, indications, risks, alternatives and potential complications including but not limited to bleeding, infection, perforation, damage to internal organs and possible oversedation/side effects from anesthesia. The patient agrees and gives consent to proceed.  Please refer to procedure notes for findings, recommendations and patient disposition/instructions.     Kelsey Rubenstein, MD [facility]

## 2022-07-09 NOTE — Anesthesia Postprocedure Evaluation (Signed)
Anesthesia Post Note  Patient: Kelsey Mcdowell  Procedure(s) Performed: COLONOSCOPY WITH PROPOFOL ESOPHAGOGASTRODUODENOSCOPY (EGD) WITH PROPOFOL  Patient location during evaluation: Endoscopy Anesthesia Type: General Level of consciousness: awake and alert Pain management: pain level controlled Vital Signs Assessment: post-procedure vital signs reviewed and stable Respiratory status: spontaneous breathing, nonlabored ventilation, respiratory function stable and patient connected to nasal cannula oxygen Cardiovascular status: blood pressure returned to baseline and stable Postop Assessment: no apparent nausea or vomiting Anesthetic complications: no   No notable events documented.   Last Vitals:  Vitals:   07/09/22 0846 07/09/22 0916  BP: 125/65 (!) 141/59  Pulse:    Resp:    Temp: (!) 35.7 C   SpO2:  97%    Last Pain:  Vitals:   07/09/22 0916  TempSrc:   PainSc: 0-No pain                 [place]uch

## 2022-07-09 NOTE — Op Note (Signed)
[facility] Patient Name: Kelsey Mcdowell Procedure Date: 07/09/2022 7:49 AM MRN: [id number] Account #: [id number] Date of Birth: Sep 30, 1946 Admit Type: Outpatient Age: 76 Room: [facility] ENDO ROOM 1 Gender: Female Note Status: Finalized Instrument Name: Colonscope [id number] Procedure:             Colonoscopy Indications:           Iron deficiency anemia Providers:             Andrey Farmer MD, MD Referring MD:          Lynnell Jude (Referring MD) Medicines:             Monitored Anesthesia Care Complications:         No immediate complications. Estimated blood loss:                         Minimal. Procedure:             Pre-Anesthesia Assessment:                        - Prior to the procedure, a History and Physical was                         performed, and patient medications and allergies were                         reviewed. The patient is competent. The risks and                         benefits of the procedure and the sedation options and                         risks were discussed with the patient. All questions                         were answered and informed consent was obtained.                         Patient identification and proposed procedure were                         verified by the physician, the nurse, the                         anesthesiologist, the anesthetist and the technician                         in the endoscopy suite. Mental Status Examination:                         alert and oriented. Airway Examination: normal                         oropharyngeal airway and neck mobility. Respiratory                         Examination: clear to auscultation. CV Examination:  normal. Prophylactic Antibiotics: The patient does not                         require prophylactic antibiotics. Prior                         Anticoagulants: The patient has taken no previous                          anticoagulant or antiplatelet agents. ASA Grade                         Assessment: III - A patient with severe systemic                         disease. After reviewing the risks and benefits, the                         patient was deemed in satisfactory condition to                         undergo the procedure. The anesthesia plan was to use                         monitored anesthesia care (MAC). Immediately prior to                         administration of medications, the patient was                         re-assessed for adequacy to receive sedatives. The                         heart rate, respiratory rate, oxygen saturations,                         blood pressure, adequacy of pulmonary ventilation, and                         response to care were monitored throughout the                         procedure. The physical status of the patient was                         re-assessed after the procedure.                        After obtaining informed consent, the colonoscope was                         passed under direct vision. Throughout the procedure,                         the patient's blood pressure, pulse, and oxygen                         saturations were monitored continuously. The  Colonoscope was introduced through the anus and                         advanced to the the cecum, identified by appendiceal                         orifice and ileocecal valve. The colonoscopy was                         somewhat difficult due to inadequate bowel prep. The                         patient tolerated the procedure well. The quality of                         the bowel preparation was inadequate. Findings:      The perianal and digital rectal examinations were normal.      A 10 mm polyp was found in the cecum. The polyp was sessile. The polyp       was removed with a piecemeal technique using a hot snare. Resection and       retrieval were complete.  Estimated blood loss was minimal.      Three sessile polyps were found in the ascending colon. The polyps were       7 to 8 mm in size. These polyps were removed with a hot snare. Resection       and retrieval were complete. Estimated blood loss was minimal.      Three sessile polyps were found in the ascending colon. The polyps were       3 to 5 mm in size. These polyps were removed with a cold snare. This       appeared to be associate with a scar so likely residual polyp from a       previous colonoscopy. Resection and retrieval were complete. Estimated       blood loss was minimal.      Two sessile polyps were found in the descending colon. The polyps were 3       to 5 mm in size. These polyps were removed with a cold snare. Resection       and retrieval were complete. Estimated blood loss was minimal.      A 7 mm polyp was found in the descending colon. The polyp was sessile.       The polyp was removed with a hot snare. Resection and retrieval were       complete. Estimated blood loss was minimal.      There was evidence of a prior surgical anastomosis in the rectum. This       was patent and was characterized by healthy appearing mucosa.      Internal hemorrhoids were found during retroflexion. The hemorrhoids       were Grade I (internal hemorrhoids that do not prolapse).      The exam was otherwise without abnormality on direct and retroflexion       views. Impression:            - Preparation of the colon was inadequate.                        - One 10 mm polyp in the cecum, removed piecemeal  using a hot snare. Resected and retrieved.                        - Three 7 to 8 mm polyps in the ascending colon,                         removed with a hot snare. Resected and retrieved.                        - Three 3 to 5 mm polyps in the ascending colon,                         removed with a cold snare. Resected and retrieved.                        - Two 3 to  5 mm polyps in the descending colon,                         removed with a cold snare. Resected and retrieved.                        - One 7 mm polyp in the descending colon, removed with                         a hot snare. Resected and retrieved.                        - Patent surgical anastomosis, characterized by                         healthy appearing mucosa.                        - Internal hemorrhoids.                        - The examination was otherwise normal on direct and                         retroflexion views. Recommendation:        - Discharge patient to home.                        - Resume previous diet.                        - Continue present medications.                        - Await pathology results.                        - Repeat colonoscopy in 6 months because the bowel                         preparation was suboptimal.                        - Return to referring physician as previously  scheduled. Procedure Code(s):     --- Professional ---                        [phone], Colonoscopy, flexible; with removal of                         tumor(s), polyp(s), or other lesion(s) by snare                         technique Diagnosis Code(s):     --- Professional ---                        K64.0, First degree hemorrhoids                        K63.5, Polyp of colon                        Z98.0, Intestinal bypass and anastomosis status                        D50.9, Iron deficiency anemia, unspecified CPT copyright 2019 American Medical Association. All rights reserved. The codes documented in this report are preliminary and upon coder review may  be revised to meet current compliance requirements. Andrey Farmer MD, MD 07/09/2022 8:53:04 AM Number of Addenda: 0 Note Initiated On: 07/09/2022 7:49 AM Scope Withdrawal Time: 0 hours 29 minutes 20 seconds  Total Procedure Duration: 0 hours 36 minutes 41 seconds  Estimated Blood Loss:   Estimated blood loss was minimal.      [employer]

## 2022-07-09 NOTE — Interval H&P Note (Signed)
History and Physical Interval Note:  07/09/2022 7:49 AM  Kelsey Mcdowell  has presented today for surgery, with the diagnosis of D50.9 - Iron deficiency anemia, unspecified iron deficiency anemia type Z86.010  - Hx of adenomatous colonic polyps.  The various methods of treatment have been discussed with the patient and family. After consideration of risks, benefits and other options for treatment, the patient has consented to  Procedure(s) with comments: COLONOSCOPY WITH PROPOFOL (N/A) - IDDM ESOPHAGOGASTRODUODENOSCOPY (EGD) WITH PROPOFOL (N/A) as a surgical intervention.  The patient's history has been reviewed, patient examined, no change in status, stable for surgery.  I have reviewed the patient's chart and labs.  Questions were answered to the patient's satisfaction.     [name]  Ok to proceed with EGD/Colonoscopy

## 2022-07-10 ENCOUNTER — Encounter: Payer: Self-pay | Admitting: Gastroenterology

## 2022-07-10 LAB — SURGICAL PATHOLOGY

## 2022-07-24 ENCOUNTER — Other Ambulatory Visit: Payer: Self-pay | Admitting: Family

## 2022-07-24 MED ORDER — SACUBITRIL-VALSARTAN 24-26 MG PO TABS: 1.0000 | ORAL_TABLET | Freq: Two times a day (BID) | ORAL | 3 refills | Status: DC

## 2022-08-05 ENCOUNTER — Telehealth: Payer: Self-pay | Admitting: Family

## 2022-08-05 NOTE — Telephone Encounter (Signed)
Called and Lvm with patient notifying her that she was approved for [employer] patient assistance until 12/22/22 and how to set up mail order delivery of medications.   Glean Salvo, [place]

## 2022-08-05 NOTE — Progress Notes (Unsigned)
Patient ID: Kelsey Mcdowell, female    DOB: July 02, 1946, 76 y.o.   MRN: [id number]  HPI  Kelsey Mcdowell is a 76 yo with a PMHx of COPD, acute on chronic systolic CHF, DM II, tobacco abuse, obesity, and HTN.  Echo report from 01/28/22 reviewed and showed: EF 40-45% LV with mildly decreased function and global hypokinesis with mild LVH.   Has not been admitted or been in the ED in the last 6 months.   She presents today for a follow-up visit with a chief complaint of moderate fatigue with minimal exertion. She describes this as chronic in nature. She has associated blurry vision, pedal edema, palpitations & dizziness along with this. She denies any difficulty sleeping, cough, wheezing, chest pain, abdominal distention or weight gain.   Has a friend that is now fixing her pill box and insulin needles for 2 weeks at a time.   Getting entresto through [employer] patient assistance  Past Medical History:  Diagnosis Date   Arthritis    COPD (chronic obstructive pulmonary disease) ([place])    Diabetes mellitus without complication (HCC)    GERD (gastroesophageal reflux disease)    Hyperlipemia    Hypertension    Myocardial infarction ([place])    light heart attack ?when   Neuropathy    Sleep apnea    Past Surgical History:  Procedure Laterality Date   ABDOMINAL HYSTERECTOMY     APPENDECTOMY     BREAST EXCISIONAL BIOPSY Left 1970's   benign   CHOLECYSTECTOMY     COLONOSCOPY WITH PROPOFOL N/A 09/30/2018   Procedure: COLONOSCOPY WITH PROPOFOL;  Surgeon: Toledo, Benay Pike, MD;  Location: [facility] ENDOSCOPY;  Service: Gastroenterology;  Laterality: N/A;   COLONOSCOPY WITH PROPOFOL N/A 07/09/2022   Procedure: COLONOSCOPY WITH PROPOFOL;  Surgeon: [name], MD;  Location: [facility]COPY;  Service: Endoscopy;  Laterality: N/A;  IDDM   ESOPHAGOGASTRODUODENOSCOPY (EGD) WITH PROPOFOL N/A 07/09/2022   Procedure: ESOPHAGOGASTRODUODENOSCOPY (EGD) WITH PROPOFOL;  Surgeon: [name],  MD;  Location: [facility] ENDOSCOPY;  Service: Endoscopy;  Laterality: N/A;   SPINE SURGERY  1998   cervical fusion   Family History  Problem Relation Age of Onset   Cancer Mother    Hypertension Mother    Diabetes Mother    Heart disease Father    Diabetes Father    Cancer Sister    Breast cancer Sister 19   Heart disease Brother    Social History   Tobacco Use   Smoking status: Some Days    Types: Cigarettes   Smokeless tobacco: Never  Substance Use Topics   Alcohol use: Yes    Comment: occasional   Allergies  Allergen Reactions   Amoxicillin Other (See Comments)    Other Reaction: GI upset, cramps   Prior to Admission medications   Medication Sig Start Date End Date Taking? Authorizing Provider  albuterol (VENTOLIN HFA) 108 (90 Base) MCG/ACT inhaler Inhale 2 puffs into the lungs every 6 (six) hours as needed for wheezing or shortness of breath. 01/29/22  Yes Wieting, Richard, MD  ALPRAZolam Duanne Moron) 0.25 MG tablet Take 0.25 mg by mouth daily at 6 (six) AM.   Yes [provider]  ARIPiprazole (ABILIFY) 2 MG tablet Take 2 mg by mouth daily. 11/17/21  Yes [provider]  furosemide (LASIX) 20 MG tablet Take 1 tablet (20 mg total) by mouth daily. Patient taking differently: Take 40 mg by mouth daily. 01/29/22  Yes Loletha Grayer, MD  gabapentin (NEURONTIN) 300 MG  capsule Take 600-900 mg by mouth See admin instructions. Take 2 capsules ('600mg'$ ) by mouth every morning and take 3 capsules ('900mg'$ ) by mouth every night   Yes [provider]  insulin glargine (LANTUS) 100 UNIT/ML Solostar Pen Inject 30 Units into the skin daily. 01/29/22  Yes Wieting, Richard, MD  Insulin Pen Needle 34G X 3.5 MM MISC 1 Dose by Does not apply route daily. 01/29/22  Yes Wieting, Richard, MD  metFORMIN (GLUCOPHAGE) 500 MG tablet Take 1,000 mg by mouth 2 (two) times daily with a meal.   Yes [provider]  Multiple Vitamin (MULTIVITAMIN WITH MINERALS) TABS tablet Take 1 tablet by  mouth daily.   Yes [provider]  nortriptyline (PAMELOR) 25 MG capsule Take 75 mg by mouth at bedtime.   Yes [provider]  omeprazole (PRILOSEC) 20 MG capsule Take 20 mg by mouth daily. 11/17/21  Yes [provider]  potassium chloride SA (KLOR-CON M) 20 MEQ tablet Take 1 tablet (20 mEq total) by mouth daily. 02/26/22  Yes Anita Mcadory A, FNP  sacubitril-valsartan (ENTRESTO) 24-26 MG Take 1 tablet by mouth 2 (two) times daily. 07/24/22  Yes Aikeem Lilley, Otila Kluver A, FNP  sertraline (ZOLOFT) 50 MG tablet Take 50 mg by mouth daily. 11/17/21  Yes [provider]  vitamin B-12 (CYANOCOBALAMIN) 1000 MCG tablet Take 1,000 mcg by mouth daily.   Yes [provider]  mometasone-formoterol (DULERA) 100-5 MCG/ACT AERO Inhale 2 puffs into the lungs 2 (two) times daily. Patient not taking: Reported on 08/06/2022 01/29/22   Loletha Grayer, MD    Review of Systems  Constitutional:  Positive for fatigue (easily). Negative for appetite change.  HENT:  Negative for congestion, postnasal drip and sore throat.   Eyes:  Positive for visual disturbance (blurry vision; recently received eye injections).  Respiratory:  Positive for shortness of breath (minimal). Negative for cough, chest tightness and wheezing.   Cardiovascular:  Positive for palpitations (at times) and leg swelling (left leg). Negative for chest pain.  Gastrointestinal:  Negative for abdominal distention and abdominal pain.  Endocrine: Negative.   Genitourinary: Negative.   Musculoskeletal:  Negative for back pain and neck pain.  Skin: Negative.   Allergic/Immunologic: Negative.   Neurological:  Positive for dizziness. Negative for light-headedness.  Hematological:  Negative for adenopathy. Does not bruise/bleed easily.  Psychiatric/Behavioral:  Negative for dysphoric mood and sleep disturbance (sleeping on 1 pillow). The patient is not nervous/anxious.    Vitals:   08/06/22 1143  BP: (!) 146/87  Pulse:  87  Resp: 16  SpO2: 97%  Weight: 188 lb 8 oz (85.5 kg)  Height: 5' (1.524 m)   Wt Readings from Last 3 Encounters:  08/06/22 188 lb 8 oz (85.5 kg)  07/09/22 185 lb (83.9 kg)  05/28/22 188 lb 6 oz (85.4 kg)   Lab Results  Component Value Date   CREATININE 1.59 (H) 04/25/2022   CREATININE 1.14 (H) 02/26/2022   CREATININE 1.04 (H) 01/29/2022   Physical Exam Vitals and nursing note reviewed. Exam conducted with a chaperone present (niece).  Constitutional:      Appearance: Normal appearance.  HENT:     Head: Normocephalic and atraumatic.  Cardiovascular:     Rate and Rhythm: Normal rate and regular rhythm.  Pulmonary:     Effort: Pulmonary effort is normal. No respiratory distress.     Breath sounds: No wheezing or rales.  Abdominal:     General: There is no distension.     Palpations:  Abdomen is soft.  Musculoskeletal:        General: No tenderness.     Cervical back: Normal range of motion and neck supple.     Right lower leg: Edema (trace pitting) present.     Left lower leg: Edema (1+ pitting) present.  Neurological:     General: No focal deficit present.     Mental Status: She is alert and oriented to person, place, and time.  Psychiatric:        Mood and Affect: Mood normal.        Behavior: Behavior normal.    Assessment and Plan:  Heart failure with mildly reduced ejection fraction- - NYHA class III - euvolemic - weighing most days but admits to not weighing daily; reviewed the importance of weighing daily and to call for an overnight weight gain of > 2 pounds or a weekly weight gain of > 5 pounds - weight unchnaged from last visit here 2 months ago - not watching her fluid intake, advised ~ 64 ounces of all fluid/day - on GDMT of entresto - will add farxiga '10mg'$  daily; patient assistance forms filled out for this today - will check BMP at next visit - saw cardiology (Paraschos) 03/15/22 - has compression socks but admits that she forgets to put them on;  encouraged to put them on every morning with removal at bedtime - BNP 01/27/22 721.9  Hypertension- - BP mildly elevated (146/87) - saw PCP (Bliss) ~ 1 month ago - BMP 04/25/22 reviewed and showed sodium 134, potassium 4.6, creatinine 1.59 and GFR 34  COPD- - saw pulmonologist Raul Del) 03/13/22 - smoking ~ 1/3 ppd of cigarettes; does confirm that she takes off her oxygen and goes outside the home to smoke  - complete cessation discussed for 3 minutes - on O2 24/7, 2 lpm  DM- - she reports fasting glucose this morning of 100 - A1c 07/03/22 was 8.9% (per PCP note)   Patient did not bring her medications nor a list. Each medication was verbally reviewed with the patient and she was encouraged to bring the bottles to every visit to confirm accuracy of list.   Return in 1 months, sooner if needed.

## 2022-08-06 ENCOUNTER — Ambulatory Visit: Payer: Medicare Other | Attending: Family | Admitting: Family

## 2022-08-06 ENCOUNTER — Telehealth: Payer: Self-pay | Admitting: Family

## 2022-08-06 ENCOUNTER — Encounter: Payer: Self-pay | Admitting: Family

## 2022-08-06 VITALS — BP 146/87 | HR 87 | Resp 16 | Ht 60.0 in | Wt 188.5 lb

## 2022-08-06 DIAGNOSIS — Z7984 Long term (current) use of oral hypoglycemic drugs: Secondary | ICD-10-CM | POA: Insufficient documentation

## 2022-08-06 DIAGNOSIS — H538 Other visual disturbances: Secondary | ICD-10-CM | POA: Insufficient documentation

## 2022-08-06 DIAGNOSIS — I10 Essential (primary) hypertension: Secondary | ICD-10-CM

## 2022-08-06 DIAGNOSIS — Z794 Long term (current) use of insulin: Secondary | ICD-10-CM | POA: Diagnosis not present

## 2022-08-06 DIAGNOSIS — R002 Palpitations: Secondary | ICD-10-CM | POA: Insufficient documentation

## 2022-08-06 DIAGNOSIS — E1122 Type 2 diabetes mellitus with diabetic chronic kidney disease: Secondary | ICD-10-CM

## 2022-08-06 DIAGNOSIS — I5022 Chronic systolic (congestive) heart failure: Secondary | ICD-10-CM | POA: Insufficient documentation

## 2022-08-06 DIAGNOSIS — I110 Hypertensive heart disease with heart failure: Secondary | ICD-10-CM | POA: Insufficient documentation

## 2022-08-06 DIAGNOSIS — J449 Chronic obstructive pulmonary disease, unspecified: Secondary | ICD-10-CM | POA: Diagnosis not present

## 2022-08-06 DIAGNOSIS — I252 Old myocardial infarction: Secondary | ICD-10-CM | POA: Diagnosis not present

## 2022-08-06 DIAGNOSIS — E1140 Type 2 diabetes mellitus with diabetic neuropathy, unspecified: Secondary | ICD-10-CM | POA: Diagnosis not present

## 2022-08-06 DIAGNOSIS — E669 Obesity, unspecified: Secondary | ICD-10-CM | POA: Diagnosis not present

## 2022-08-06 DIAGNOSIS — R42 Dizziness and giddiness: Secondary | ICD-10-CM | POA: Insufficient documentation

## 2022-08-06 DIAGNOSIS — N1831 Chronic kidney disease, stage 3a: Secondary | ICD-10-CM

## 2022-08-06 DIAGNOSIS — F17210 Nicotine dependence, cigarettes, uncomplicated: Secondary | ICD-10-CM | POA: Insufficient documentation

## 2022-08-06 MED ORDER — DAPAGLIFLOZIN PROPANEDIOL 10 MG PO TABS: 10.0000 mg | ORAL_TABLET | Freq: Every day | ORAL | 5 refills | Status: DC

## 2022-08-06 NOTE — Telephone Encounter (Signed)
Called patients PCP and LVM requesting most recent BMP and A1C if they have it on this patient and requested it to be faxed to our office as soon as possible.   Dnyla Antonetti, NT

## 2022-08-06 NOTE — Patient Instructions (Addendum)
Continue weighing daily and call for an overnight weight gain of 3 pounds or more or a weekly weight gain of more than 5 pounds.   If you have voicemail, please make sure your mailbox is cleaned out so that we may leave a message and please make sure to listen to any voicemails.    Put compression socks on every morning with removal at bedtime.

## 2022-08-09 ENCOUNTER — Other Ambulatory Visit: Payer: Self-pay | Admitting: Family

## 2022-08-09 MED ORDER — DAPAGLIFLOZIN PROPANEDIOL 10 MG PO TABS: 10.0000 mg | ORAL_TABLET | Freq: Every day | ORAL | 5 refills | Status: DC

## 2022-08-09 NOTE — Progress Notes (Signed)
New farxiga voucher sent to [place] as previous one wouldn't work.

## 2022-08-19 ENCOUNTER — Other Ambulatory Visit: Payer: Self-pay | Admitting: Family

## 2022-09-05 ENCOUNTER — Encounter: Payer: Self-pay | Admitting: Family

## 2022-09-05 ENCOUNTER — Ambulatory Visit: Payer: Medicare Other | Attending: Family | Admitting: Family

## 2022-09-05 VITALS — BP 116/66 | HR 78 | Resp 18 | Ht 60.0 in | Wt 187.5 lb

## 2022-09-05 DIAGNOSIS — I110 Hypertensive heart disease with heart failure: Secondary | ICD-10-CM | POA: Insufficient documentation

## 2022-09-05 DIAGNOSIS — I5022 Chronic systolic (congestive) heart failure: Secondary | ICD-10-CM

## 2022-09-05 DIAGNOSIS — R0602 Shortness of breath: Secondary | ICD-10-CM | POA: Insufficient documentation

## 2022-09-05 DIAGNOSIS — R609 Edema, unspecified: Secondary | ICD-10-CM | POA: Diagnosis present

## 2022-09-05 DIAGNOSIS — N1831 Chronic kidney disease, stage 3a: Secondary | ICD-10-CM

## 2022-09-05 DIAGNOSIS — E119 Type 2 diabetes mellitus without complications: Secondary | ICD-10-CM | POA: Diagnosis not present

## 2022-09-05 DIAGNOSIS — E669 Obesity, unspecified: Secondary | ICD-10-CM | POA: Insufficient documentation

## 2022-09-05 DIAGNOSIS — J449 Chronic obstructive pulmonary disease, unspecified: Secondary | ICD-10-CM | POA: Diagnosis not present

## 2022-09-05 DIAGNOSIS — E1122 Type 2 diabetes mellitus with diabetic chronic kidney disease: Secondary | ICD-10-CM

## 2022-09-05 DIAGNOSIS — I10 Essential (primary) hypertension: Secondary | ICD-10-CM

## 2022-09-05 DIAGNOSIS — Z794 Long term (current) use of insulin: Secondary | ICD-10-CM

## 2022-09-05 DIAGNOSIS — F17210 Nicotine dependence, cigarettes, uncomplicated: Secondary | ICD-10-CM | POA: Diagnosis not present

## 2022-09-05 MED ORDER — DAPAGLIFLOZIN PROPANEDIOL 10 MG PO TABS: 10.0000 mg | ORAL_TABLET | Freq: Every day | ORAL | 3 refills | Status: DC

## 2022-09-05 NOTE — Progress Notes (Signed)
Patient ID: Kelsey Mcdowell, female    DOB: 05-13-1946, 76 y.o.   MRN: [id number]  HPI  Kelsey Mcdowell is a 76 yo with a PMHx of COPD, acute on chronic systolic CHF, DM II, tobacco abuse, obesity, and HTN.  Echo report from 01/28/22 reviewed and showed: EF 40-45% LV with mildly decreased function and global hypokinesis with mild LVH.   Has not been admitted or been in the ED in the last 6 months.   She presents today for a follow-up visit with a chief complaint of moderate fatigue (stays tired) with minimal exertion, and SOB (mostly when walking). She describes this as chronic in nature. She has associated blurry vision, left lower extremity edema. Denies dizziness, headaches, cough, wheezing, chest pain/pressure, palpitations, abdominal distention, nor difficulty sleeping.  Trying to follow a low-sodium diet. Not really monitoring daily. Taking blood sugars twice daily. Does not wear compression hoses. Not weighing daily.   Getting entresto through [employer] patient assistance  Past Medical History:  Diagnosis Date   Arthritis    COPD (chronic obstructive pulmonary disease) ([place])    Diabetes mellitus without complication (HCC)    GERD (gastroesophageal reflux disease)    Hyperlipemia    Hypertension    Myocardial infarction ([place])    light heart attack ?when   Neuropathy    Sleep apnea    Past Surgical History:  Procedure Laterality Date   ABDOMINAL HYSTERECTOMY     APPENDECTOMY     BREAST EXCISIONAL BIOPSY Left 1970's   benign   CHOLECYSTECTOMY     COLONOSCOPY WITH PROPOFOL N/A 09/30/2018   Procedure: COLONOSCOPY WITH PROPOFOL;  Surgeon: Toledo, Benay Pike, MD;  Location: [facility];  Service: Gastroenterology;  Laterality: N/A;   COLONOSCOPY WITH PROPOFOL N/A 07/09/2022   Procedure: COLONOSCOPY WITH PROPOFOL;  Surgeon: [name], MD;  Location: [facility];  Service: Endoscopy;  Laterality: N/A;  IDDM   ESOPHAGOGASTRODUODENOSCOPY (EGD) WITH PROPOFOL  N/A 07/09/2022   Procedure: ESOPHAGOGASTRODUODENOSCOPY (EGD) WITH PROPOFOL;  Surgeon: [name], MD;  Location: [facility];  Service: Endoscopy;  Laterality: N/A;   SPINE SURGERY  1998   cervical fusion   Family History  Problem Relation Age of Onset   Cancer Mother    Hypertension Mother    Diabetes Mother    Heart disease Father    Diabetes Father    Cancer Sister    Breast cancer Sister 10   Heart disease Brother    Social History   Tobacco Use   Smoking status: Some Days    Types: Cigarettes   Smokeless tobacco: Never  Substance Use Topics   Alcohol use: Yes    Comment: occasional   Allergies  Allergen Reactions   Amoxicillin Other (See Comments)    Other Reaction: GI upset, cramps   Prior to Admission medications   Medication Sig Start Date End Date Taking? Authorizing Provider  albuterol (VENTOLIN HFA) 108 (90 Base) MCG/ACT inhaler Inhale 2 puffs into the lungs every 6 (six) hours as needed for wheezing or shortness of breath. 01/29/22  Yes Wieting, Richard, MD  ALPRAZolam Duanne Moron) 0.25 MG tablet Take 0.25 mg by mouth daily at 6 (six) AM.   Yes [provider]  ARIPiprazole (ABILIFY) 2 MG tablet Take 2 mg by mouth daily. 11/17/21  Yes [provider]  dapagliflozin propanediol (FARXIGA) 10 MG TABS tablet Take 1 tablet (10 mg total) by mouth daily before breakfast. 08/09/22  Yes Alisa Graff, FNP  furosemide (LASIX) 20  MG tablet Take 1 tablet (20 mg total) by mouth daily. Patient taking differently: Take 40 mg by mouth daily. 01/29/22  Yes Wieting, Richard, MD  gabapentin (NEURONTIN) 300 MG capsule Take 600-900 mg by mouth See admin instructions. Take 2 capsules ('600mg'$ ) by mouth every morning and take 3 capsules ('900mg'$ ) by mouth every night   Yes [provider]  insulin glargine (LANTUS) 100 UNIT/ML Solostar Pen Inject 30 Units into the skin daily. 01/29/22  Yes Wieting, Richard, MD  Insulin Pen Needle 34G X 3.5 MM MISC 1 Dose by Does  not apply route daily. 01/29/22  Yes Wieting, Richard, MD  metFORMIN (GLUCOPHAGE) 500 MG tablet Take 1,000 mg by mouth 2 (two) times daily with a meal.   Yes [provider]  Multiple Vitamin (MULTIVITAMIN WITH MINERALS) TABS tablet Take 1 tablet by mouth daily.   Yes [provider]  nortriptyline (PAMELOR) 25 MG capsule Take 75 mg by mouth at bedtime.   Yes [provider]  omeprazole (PRILOSEC) 20 MG capsule Take 20 mg by mouth daily. 11/17/21  Yes [provider]  potassium chloride SA (KLOR-CON M) 20 MEQ tablet Take 1 tablet (20 mEq total) by mouth daily. 02/26/22  Yes Hackney, Tina A, FNP  sacubitril-valsartan (ENTRESTO) 24-26 MG Take 1 tablet by mouth 2 (two) times daily. 07/24/22  Yes Hackney, Otila Kluver A, FNP  sertraline (ZOLOFT) 50 MG tablet Take 50 mg by mouth daily. 11/17/21  Yes [provider]  vitamin B-12 (CYANOCOBALAMIN) 1000 MCG tablet Take 1,000 mcg by mouth daily.   Yes [provider]  mometasone-formoterol (DULERA) 100-5 MCG/ACT AERO Inhale 2 puffs into the lungs 2 (two) times daily. Patient not taking: Reported on 08/06/2022 01/29/22   Loletha Grayer, MD   Review of Systems  Constitutional:  Positive for fatigue (easily). Negative for appetite change.  HENT:  Negative for congestion, postnasal drip and sore throat.   Eyes:  Positive for visual disturbance (blurry vision; recently received eye injections).  Respiratory:  Positive for shortness of breath (minimal). Negative for cough, chest tightness and wheezing.   Cardiovascular:  Positive for leg swelling (left leg). Negative for chest pain and palpitations (at times).  Gastrointestinal:  Negative for abdominal distention and abdominal pain.  Endocrine: Negative.   Genitourinary: Negative.   Musculoskeletal:  Negative for back pain and neck pain.  Skin: Negative.   Allergic/Immunologic: Negative.   Neurological:  Negative for dizziness and light-headedness.  Hematological:   Negative for adenopathy. Does not bruise/bleed easily.  Psychiatric/Behavioral:  Negative for dysphoric mood and sleep disturbance (sleeping on 1 pillow). The patient is not nervous/anxious.    Vitals:   09/05/22 1116  BP: 116/66  Pulse: 78  Resp: 18  SpO2: 98%    Wt Readings from Last 3 Encounters:  09/05/22 187 lb 8 oz (85 kg)  08/06/22 188 lb 8 oz (85.5 kg)  07/09/22 185 lb (83.9 kg)    Lab Results  Component Value Date   CREATININE 1.59 (H) 04/25/2022   CREATININE 1.14 (H) 02/26/2022   CREATININE 1.04 (H) 01/29/2022     Physical Exam Vitals and nursing note reviewed. Exam conducted with a chaperone present (niece).  Constitutional:      Appearance: Normal appearance.  HENT:     Head: Normocephalic and atraumatic.  Cardiovascular:     Rate and Rhythm: Normal rate and regular rhythm.  Pulmonary:     Effort: Pulmonary effort is normal. No respiratory distress.     Breath sounds: No  wheezing or rales.  Abdominal:     General: There is no distension.     Palpations: Abdomen is soft.  Musculoskeletal:        General: No tenderness.     Cervical back: Normal range of motion and neck supple.     Right lower leg: Edema (trace pitting) present.     Left lower leg: Edema (trace pitting) present.  Neurological:     General: No focal deficit present.     Mental Status: She is alert and oriented to person, place, and time.  Psychiatric:        Mood and Affect: Mood normal.        Behavior: Behavior normal.    Assessment and Plan:  Chronic heart failure with mildly reduced ejection fraction- - NYHA class III - euvolemic - weighing most days but admits to not weighing daily; reviewed the importance of weighing daily and to call for an overnight weight gain of > 2 pounds or a weekly weight gain of > 5 pounds - weight 187 today down 1 pound from last visit here on 08/06/22 - not watching her fluid intake, advised ~ 64 ounces of all fluid/day - on GDMT of entresto - saw  cardiology (Paraschos) 08/22/22 - has compression socks but admits that she forgets to put them on; encouraged to put them on every morning with removal at bedtime - BNP 01/27/22 721.9  Hypertension- - BP looks good (116/66) - saw PCP Clemmie Krill) ~ going next Monday for blood work  - [employer] 04/25/22 reviewed and showed sodium 134, potassium 4.6, creatinine 1.59 and GFR 34 - Will send PCP order for BMP to be drawn next Monday  COPD- - saw pulmonologist Raul Del) 03/13/22 - smoking ~ 1/3 ppd of cigarettes; does confirm that she takes off her oxygen and goes outside the home to smoke  - complete cessation discussed for 3 minutes - on O2 @ 2L 24/7  DM- - BG was 82 at home  - A1c 07/03/22 was 8.9% (per PCP note) - getting A1C drawn next Monday    Patient did not bring her medications nor a list. Each medication was verbally reviewed with the patient and she was encouraged to bring the bottles to every visit to confirm accuracy of list.   Return in 3 months, sooner if needed.

## 2022-09-05 NOTE — Patient Instructions (Signed)
Continue weighing daily and call for an overnight weight gain of 3 pounds or more or a weekly weight gain of more than 5 pounds.  °

## 2022-09-11 ENCOUNTER — Other Ambulatory Visit
Admission: RE | Admit: 2022-09-11 | Discharge: 2022-09-11 | Disposition: A | Discharge status: Home / Self Care | Payer: Medicare Other | Attending: Dermatology | Admitting: Dermatology

## 2022-09-11 ENCOUNTER — Other Ambulatory Visit: Payer: Self-pay | Admitting: Family

## 2022-09-11 ENCOUNTER — Telehealth: Payer: Self-pay | Admitting: Family

## 2022-09-11 ENCOUNTER — Other Ambulatory Visit
Admission: RE | Admit: 2022-09-11 | Discharge: 2022-09-11 | Disposition: A | Discharge status: Home / Self Care | Payer: Medicare Other | Source: Home / Self Care | Attending: Family | Admitting: Family

## 2022-09-11 DIAGNOSIS — L409 Psoriasis, unspecified: Secondary | ICD-10-CM | POA: Insufficient documentation

## 2022-09-11 DIAGNOSIS — E559 Vitamin D deficiency, unspecified: Secondary | ICD-10-CM | POA: Insufficient documentation

## 2022-09-11 DIAGNOSIS — Z79899 Other long term (current) drug therapy: Secondary | ICD-10-CM | POA: Insufficient documentation

## 2022-09-11 DIAGNOSIS — I509 Heart failure, unspecified: Secondary | ICD-10-CM | POA: Insufficient documentation

## 2022-09-11 DIAGNOSIS — I5022 Chronic systolic (congestive) heart failure: Secondary | ICD-10-CM

## 2022-09-11 LAB — CBC WITH DIFFERENTIAL/PLATELET
Abs Immature Granulocytes: 0.02 10*3/uL (ref 0.00–0.07)
Basophils Absolute: 0.1 10*3/uL (ref 0.0–0.1)
Basophils Relative: 1 %
Eosinophils Absolute: 0.2 10*3/uL (ref 0.0–0.5)
Eosinophils Relative: 2 %
HCT: 36.3 % (ref 36.0–46.0)
Hemoglobin: 11.5 g/dL — ABNORMAL LOW (ref 12.0–15.0)
Immature Granulocytes: 0 %
Lymphocytes Relative: 37 %
Lymphs Abs: 2.7 10*3/uL (ref 0.7–4.0)
MCH: 30.3 pg (ref 26.0–34.0)
MCHC: 31.7 g/dL (ref 30.0–36.0)
MCV: 95.5 fL (ref 80.0–100.0)
Monocytes Absolute: 0.4 10*3/uL (ref 0.1–1.0)
Monocytes Relative: 6 %
Neutro Abs: 4.1 10*3/uL (ref 1.7–7.7)
Neutrophils Relative %: 54 %
Platelets: 236 10*3/uL (ref 150–400)
RBC: 3.8 MIL/uL — ABNORMAL LOW (ref 3.87–5.11)
RDW: 13.3 % (ref 11.5–15.5)
WBC: 7.5 10*3/uL (ref 4.0–10.5)
nRBC: 0 % (ref 0.0–0.2)

## 2022-09-11 LAB — VITAMIN D 25 HYDROXY (VIT D DEFICIENCY, FRACTURES): Vit D, 25-Hydroxy: 24.78 ng/mL — ABNORMAL LOW (ref 30–100)

## 2022-09-11 LAB — COMPREHENSIVE METABOLIC PANEL
ALT: 12 U/L (ref 0–44)
AST: 21 U/L (ref 15–41)
Albumin: 3.8 g/dL (ref 3.5–5.0)
Alkaline Phosphatase: 65 U/L (ref 38–126)
Anion gap: 8 (ref 5–15)
BUN: 22 mg/dL (ref 8–23)
CO2: 26 mmol/L (ref 22–32)
Calcium: 9.1 mg/dL (ref 8.9–10.3)
Chloride: 105 mmol/L (ref 98–111)
Creatinine, Ser: 1.56 mg/dL — ABNORMAL HIGH (ref 0.44–1.00)
GFR, Estimated: 34 mL/min — ABNORMAL LOW (ref >60)
Glucose, Bld: 173 mg/dL — ABNORMAL HIGH (ref 70–99)
Potassium: 4.3 mmol/L (ref 3.5–5.1)
Sodium: 139 mmol/L (ref 135–145)
Total Bilirubin: 0.4 mg/dL (ref 0.3–1.2)
Total Protein: 8.4 g/dL — ABNORMAL HIGH (ref 6.5–8.1)

## 2022-09-11 LAB — HEPATITIS PANEL, ACUTE
HCV Ab: NONREACTIVE
Hep A IgM: NONREACTIVE
Hep B C IgM: NONREACTIVE
Hepatitis B Surface Ag: NONREACTIVE

## 2022-09-11 LAB — BASIC METABOLIC PANEL
Anion gap: 8 (ref 5–15)
BUN: 22 mg/dL (ref 8–23)
CO2: 26 mmol/L (ref 22–32)
Calcium: 9.1 mg/dL (ref 8.9–10.3)
Chloride: 105 mmol/L (ref 98–111)
Creatinine, Ser: 1.57 mg/dL — ABNORMAL HIGH (ref 0.44–1.00)
GFR, Estimated: 34 mL/min — ABNORMAL LOW (ref >60)
Glucose, Bld: 178 mg/dL — ABNORMAL HIGH (ref 70–99)
Potassium: 4.4 mmol/L (ref 3.5–5.1)
Sodium: 139 mmol/L (ref 135–145)

## 2022-09-11 NOTE — Telephone Encounter (Signed)
LM for patient to advise of her lab work. Labs are stable and if she has any questions, she can give [place] a call back.

## 2022-09-12 ENCOUNTER — Telehealth: Payer: Self-pay | Admitting: Family

## 2022-09-12 NOTE — Telephone Encounter (Signed)
Notified patient that her patient assistance application for Wilder Glade has been approved and medication will arrive in 7 to 10 business days.   Margret Moat, NT

## 2022-09-15 LAB — QUANTIFERON-TB GOLD PLUS (RQFGPL)
QuantiFERON Mitogen Value: >10 IU/mL
QuantiFERON Nil Value: 0.12 IU/mL
QuantiFERON TB1 Ag Value: 0.1 IU/mL
QuantiFERON TB2 Ag Value: 0.1 IU/mL

## 2022-09-15 LAB — QUANTIFERON-TB GOLD PLUS: QuantiFERON-TB Gold Plus: NEGATIVE

## 2022-10-15 ENCOUNTER — Other Ambulatory Visit: Payer: Self-pay | Admitting: Family

## 2022-10-30 ENCOUNTER — Telehealth: Payer: Self-pay | Admitting: Family

## 2022-10-30 NOTE — Telephone Encounter (Signed)
Notified patient that her application for Delene Loll is expiring 12/22/22 and that she neeeds to come in to fill out a new application and bring proof of income.   Teyla Skidgel, NT

## 2022-12-04 ENCOUNTER — Other Ambulatory Visit
Admission: RE | Admit: 2022-12-04 | Discharge: 2022-12-04 | Disposition: A | Discharge status: Home / Self Care | Payer: Medicare Other | Source: Ambulatory Visit | Attending: Family | Admitting: Family

## 2022-12-04 ENCOUNTER — Encounter: Payer: Self-pay | Admitting: Family

## 2022-12-04 ENCOUNTER — Telehealth: Payer: Self-pay | Admitting: Family

## 2022-12-04 ENCOUNTER — Ambulatory Visit (HOSPITAL_BASED_OUTPATIENT_CLINIC_OR_DEPARTMENT_OTHER): Payer: Medicare Other | Admitting: Family

## 2022-12-04 VITALS — BP 142/66 | HR 74 | Resp 18 | Wt 192.0 lb

## 2022-12-04 DIAGNOSIS — I5022 Chronic systolic (congestive) heart failure: Secondary | ICD-10-CM

## 2022-12-04 DIAGNOSIS — Z794 Long term (current) use of insulin: Secondary | ICD-10-CM

## 2022-12-04 DIAGNOSIS — E1122 Type 2 diabetes mellitus with diabetic chronic kidney disease: Secondary | ICD-10-CM | POA: Diagnosis not present

## 2022-12-04 DIAGNOSIS — N1831 Chronic kidney disease, stage 3a: Secondary | ICD-10-CM

## 2022-12-04 DIAGNOSIS — I10 Essential (primary) hypertension: Secondary | ICD-10-CM | POA: Diagnosis not present

## 2022-12-04 DIAGNOSIS — J449 Chronic obstructive pulmonary disease, unspecified: Secondary | ICD-10-CM | POA: Diagnosis not present

## 2022-12-04 LAB — HEPATIC FUNCTION PANEL
ALT: 13 U/L (ref 0–44)
AST: 19 U/L (ref 15–41)
Albumin: 3.7 g/dL (ref 3.5–5.0)
Alkaline Phosphatase: 63 U/L (ref 38–126)
Bilirubin, Direct: <0.1 mg/dL (ref 0.0–0.2)
Total Bilirubin: 0.5 mg/dL (ref 0.3–1.2)
Total Protein: 8.3 g/dL — ABNORMAL HIGH (ref 6.5–8.1)

## 2022-12-04 LAB — BASIC METABOLIC PANEL
Anion gap: 8 (ref 5–15)
BUN: 19 mg/dL (ref 8–23)
CO2: 26 mmol/L (ref 22–32)
Calcium: 9.5 mg/dL (ref 8.9–10.3)
Chloride: 104 mmol/L (ref 98–111)
Creatinine, Ser: 1.47 mg/dL — ABNORMAL HIGH (ref 0.44–1.00)
GFR, Estimated: 37 mL/min — ABNORMAL LOW (ref >60)
Glucose, Bld: 176 mg/dL — ABNORMAL HIGH (ref 70–99)
Potassium: 4.2 mmol/L (ref 3.5–5.1)
Sodium: 138 mmol/L (ref 135–145)

## 2022-12-04 MED ORDER — SACUBITRIL-VALSARTAN 24-26 MG PO TABS: 1.0000 | ORAL_TABLET | Freq: Two times a day (BID) | ORAL | 3 refills | Status: DC

## 2022-12-04 NOTE — Progress Notes (Signed)
Patient ID: Fama Muenchow, female    DOB: June 15, 1946, 76 y.o.   MRN: [id number]  HPI  Charlene Detter is a 76 yo with a PMHx of COPD, acute on chronic systolic CHF, DM II, tobacco abuse, obesity, and HTN.  Echo report from 01/28/22 reviewed and showed: EF 40-45% LV with mildly decreased function and global hypokinesis with mild LVH.   Has not been admitted or been in the ED in the last 6 months.   She presents today for a follow-up visit with a chief complaint of moderate fatigue with minimal exertion. Describes this as chronic in nature. She has associated cough, shortness of breath, pedal edema (left leg) and gradual weight gain along with this. She denies any difficulty sleeping, dizziness, abdominal distention, chest pain or wheezing.   Put the compression sock on her left leg this morning because it was swollen and the swelling has improved some. Not consistently weighing daily.   Still wears oxygen but does remove it and goes outside to smoke.   Past Medical History:  Diagnosis Date   Arthritis    COPD (chronic obstructive pulmonary disease) ([place])    Diabetes mellitus without complication (HCC)    GERD (gastroesophageal reflux disease)    Hyperlipemia    Hypertension    Myocardial infarction ([place])    light heart attack ?when   Neuropathy    Sleep apnea    Past Surgical History:  Procedure Laterality Date   ABDOMINAL HYSTERECTOMY     APPENDECTOMY     BREAST EXCISIONAL BIOPSY Left 1970's   benign   CHOLECYSTECTOMY     COLONOSCOPY WITH PROPOFOL N/A 09/30/2018   Procedure: COLONOSCOPY WITH PROPOFOL;  Surgeon: Toledo, Benay Pike, MD;  Location: [facility] ENDOSCOPY;  Service: Gastroenterology;  Laterality: N/A;   COLONOSCOPY WITH PROPOFOL N/A 07/09/2022   Procedure: COLONOSCOPY WITH PROPOFOL;  Surgeon: [name], MD;  Location: [facility] ENDOSCOPY;  Service: Endoscopy;  Laterality: N/A;  IDDM   ESOPHAGOGASTRODUODENOSCOPY (EGD) WITH PROPOFOL N/A 07/09/2022    Procedure: ESOPHAGOGASTRODUODENOSCOPY (EGD) WITH PROPOFOL;  Surgeon: [name], MD;  Location: [facility] ENDOSCOPY;  Service: Endoscopy;  Laterality: N/A;   SPINE SURGERY  1998   cervical fusion   Family History  Problem Relation Age of Onset   Cancer Mother    Hypertension Mother    Diabetes Mother    Heart disease Father    Diabetes Father    Cancer Sister    Breast cancer Sister 6   Heart disease Brother    Social History   Tobacco Use   Smoking status: Some Days    Types: Cigarettes   Smokeless tobacco: Never  Substance Use Topics   Alcohol use: Yes    Comment: occasional   Allergies  Allergen Reactions   Amoxicillin Other (See Comments)    Other Reaction: GI upset, cramps   Prior to Admission medications   Medication Sig Start Date End Date Taking? Authorizing Provider  acetaminophen (TYLENOL) 500 MG tablet Take 500 mg by mouth every 6 (six) hours as needed (back pain).   Yes [provider]  albuterol (VENTOLIN HFA) 108 (90 Base) MCG/ACT inhaler Inhale 2 puffs into the lungs every 6 (six) hours as needed for wheezing or shortness of breath. 01/29/22  Yes Wieting, Richard, MD  ALPRAZolam Duanne Moron) 0.25 MG tablet Take 0.25 mg by mouth daily at 6 (six) AM.   Yes [provider]  ARIPiprazole (ABILIFY) 2 MG tablet Take 2 mg by mouth daily. 11/17/21  Yes [provider]  dapagliflozin propanediol (FARXIGA) 10 MG TABS tablet Take 1 tablet (10 mg total) by mouth daily before breakfast. 09/05/22  Yes Darylene Price A, FNP  furosemide (LASIX) 20 MG tablet Take 1 tablet (20 mg total) by mouth daily. Patient taking differently: Take 40 mg by mouth daily. 01/29/22  Yes Wieting, Richard, MD  gabapentin (NEURONTIN) 300 MG capsule Take 600-900 mg by mouth 4 (four) times daily. Take 2 capsules ('600mg'$ ) by mouth every morning and take 3 capsules ('900mg'$ ) by mouth every night   Yes [provider]  insulin aspart (NOVOLOG) 100 UNIT/ML injection Inject 5  Units into the skin 3 (three) times daily with meals.   Yes [provider]  insulin glargine (LANTUS) 100 UNIT/ML Solostar Pen Inject 30 Units into the skin daily. 01/29/22  Yes Wieting, Richard, MD  Insulin Pen Needle 34G X 3.5 MM MISC 1 Dose by Does not apply route daily. 01/29/22  Yes Wieting, Richard, MD  metFORMIN (GLUCOPHAGE) 500 MG tablet Take 1,000 mg by mouth 2 (two) times daily with a meal.   Yes [provider]  metoprolol succinate (TOPROL-XL) 25 MG 24 hr tablet Take 25 mg by mouth daily.   Yes [provider]  Multiple Vitamin (MULTIVITAMIN WITH MINERALS) TABS tablet Take 1 tablet by mouth daily.   Yes [provider]  nortriptyline (PAMELOR) 25 MG capsule Take 75 mg by mouth at bedtime.   Yes [provider]  omeprazole (PRILOSEC) 20 MG capsule Take 20 mg by mouth daily. 11/17/21  Yes [provider]  potassium chloride SA (KLOR-CON M) 20 MEQ tablet TAKE 1 TABLET BY MOUTH DAILY 10/16/22  Yes Eran Mistry A, FNP  Secukinumab (COSENTYX [place]) Inject into the skin. Last date give was the week of 11/25/22. Pt unsure of exact date.   Yes [provider]  sertraline (ZOLOFT) 50 MG tablet Take 50 mg by mouth daily. 11/17/21  Yes [provider]  vitamin B-12 (CYANOCOBALAMIN) 1000 MCG tablet Take 1,000 mcg by mouth daily.   Yes [provider]  sacubitril-valsartan (ENTRESTO) 24-26 MG Take 1 tablet by mouth 2 (two) times daily. 12/04/22   Alisa Graff, FNP    Review of Systems  Constitutional:  Positive for fatigue (easily). Negative for appetite change.  HENT:  Negative for congestion, postnasal drip and sore throat.   Eyes:  Positive for visual disturbance (blurry vision).  Respiratory:  Positive for cough and shortness of breath (easily). Negative for chest tightness and wheezing.   Cardiovascular:  Positive for leg swelling (left leg). Negative for chest pain and palpitations (at times).  Gastrointestinal:   Negative for abdominal distention and abdominal pain.  Endocrine: Negative.   Genitourinary: Negative.   Musculoskeletal:  Negative for back pain and neck pain.  Skin: Negative.   Allergic/Immunologic: Negative.   Neurological:  Negative for dizziness and light-headedness.  Hematological:  Negative for adenopathy. Does not bruise/bleed easily.  Psychiatric/Behavioral:  Negative for dysphoric mood and sleep disturbance (sleeping on 1 pillow). The patient is not nervous/anxious.    Vitals:   12/04/22 1107  BP: (!) 142/66  Pulse: 74  Resp: 18  SpO2: 95%  Weight: 192 lb (87.1 kg)   Wt Readings from Last 3 Encounters:  12/04/22 192 lb (87.1 kg)  09/05/22 187 lb 8 oz (85 kg)  08/06/22 188 lb 8 oz (85.5 kg)   Lab Results  Component Value Date   CREATININE 1.56 (H) 09/11/2022   CREATININE 1.57 (H)  09/11/2022   CREATININE 1.59 (H) 04/25/2022    Physical Exam Vitals and nursing note reviewed. Exam conducted with a chaperone present (niece).  Constitutional:      Appearance: Normal appearance.  HENT:     Head: Normocephalic and atraumatic.  Cardiovascular:     Rate and Rhythm: Normal rate and regular rhythm.  Pulmonary:     Effort: Pulmonary effort is normal. No respiratory distress.     Breath sounds: No wheezing or rales.  Abdominal:     General: There is no distension.     Palpations: Abdomen is soft.  Musculoskeletal:        General: No tenderness.     Cervical back: Normal range of motion and neck supple.     Right lower leg: No tenderness. No edema.     Left lower leg: No tenderness. Edema (trace pitting) present.  Neurological:     General: No focal deficit present.     Mental Status: She is alert and oriented to person, place, and time.  Psychiatric:        Mood and Affect: Mood normal.        Behavior: Behavior normal.    Assessment and Plan:  Chronic heart failure with mildly reduced ejection fraction- - NYHA class III - euvolemic - weighing most days but  admits to not weighing daily; reviewed the importance of weighing daily and to call for an overnight weight gain of > 2 pounds or a weekly weight gain of > 5 pounds - weight up 5 pounds from last visit here 3 months ago - reviewed keeping daily fluid intake to 60-64 ounces - on GDMT of entresto - saw cardiology (Paraschos) 08/22/22 - put the compression sock on her left leg due to swelling and does feel like it has improved; encouraged to put it on every morning with removal at bedtime - discussed other GDMT that we should put her on; she is hesitant and then becomes tearful because she has family members that ended up with liver disease because of too many medications - will check LFT's today to help her be more comfortable with the idea of more medications - if she's agreeable, will add SGLT2 - BNP 01/27/22 721.9 - PharmD reconciled medications with the patient  Hypertension- - BP mildly elevated (142/66) - saw PCP Clemmie Krill)  - BMP 09/11/22 reviewed and showed sodium 139, potassium 4.3, creatinine 1.56 and GFR 34 - recheck BMP today  COPD- - saw pulmonologist Raul Del) 03/13/22 - smoking ~ 1/3 ppd of cigarettes; does confirm that she takes off her oxygen and goes outside the home to smoke  - complete cessation discussed for 3 minutes - on O2 @ 2L 24/7 although came without it today  DM- - glucose at home this morning was 104 - A1c 07/03/22 was 8.9% (per PCP note)   Medication list reviewed  Return in 1 month, sooner if needed

## 2022-12-04 NOTE — Progress Notes (Signed)
[place] - PHARMACIST COUNSELING NOTE  Guideline-Directed Medical Therapy/Evidence Based Medicine  ACE/ARB/ARNI: Sacubitril-valsartan 24-26 mg twice daily Beta Blocker: Metoprolol succinate 25 mg daily Aldosterone Antagonist:  N/A Diuretic: Furosemide 40 mg daily SGLT2i: Dapagliflozin 10 mg daily  Adherence Assessment  Do you ever forget to take your medication? '[]'$ Yes '[x]'$ No  Do you ever skip doses due to side effects? '[]'$ Yes '[x]'$ No  Do you have trouble affording your medicines? '[x]'$ Yes '[]'$ No  Are you ever unable to pick up your medication due to transportation difficulties? '[]'$ Yes '[x]'$ No  Do you ever stop taking your medications because you don't believe they are helping? '[]'$ Yes '[x]'$ No  Do you check your weight daily? '[x]'$ Yes '[]'$ No   Adherence strategy: Patient states she is compliant with her medications. She does state she is taking a lot of medications and fell weak and down at times.   Barriers to obtaining medications: Cost of entresto and farxiga. Pt applied for patient assistance.   Vital signs: HR 74, BP 142/66, weight (pounds) 192 ECHO: Date 01/2022, EF 40-45, notes Left ventricular ejection fraction, by estimation, is 40 to 45%. The left ventricle has mildly decreased function. The left ventricle  demonstrates global hypokinesis. The left ventricular internal cavity size  was mildly dilated. There is mild left ventricular hypertrophy. Left ventricular diastolic parameters are consistent with Grade II diastolic dysfunction (pseudonormalization).      Latest Ref Rng & Units 09/11/2022   10:45 AM 09/11/2022   10:40 AM 04/25/2022    2:36 PM  BMP  Glucose 70 - 99 mg/dL 173  178  353   BUN 8 - 23 mg/dL '22  22  26   '$ Creatinine 0.44 - 1.00 mg/dL 1.56  1.57  1.59   Sodium 135 - 145 mmol/L 139  139  134   Potassium 3.5 - 5.1 mmol/L 4.3  4.4  4.6   Chloride 98 - 111 mmol/L 105  105  98   CO2 22 - 32 mmol/L '26  26  26   '$ Calcium 8.9 - 10.3  mg/dL 9.1  9.1  9.3     Past Medical History:  Diagnosis Date   Arthritis    COPD (chronic obstructive pulmonary disease) (HCC)    Diabetes mellitus without complication (HCC)    GERD (gastroesophageal reflux disease)    Hyperlipemia    Hypertension    Myocardial infarction ([place])    light heart attack ?when   Neuropathy    Sleep apnea     ASSESSMENT 76 year old female who presents to the HF clinic for follow up appointment. Patient has some complaints of weakness and feeling down, possibly due to scheduled benzo. Pt has some interest on stop taking it, but niece believes that patient should continue taking it. Recommended to the patient to discuss with her doctors and make a plan that fits her. Scr has been trending up. Patient continues to have some elevated blood pressure. Patient did not bring her medications in with her so it was hard to complete the med rec, as the patient is not a great historian of her medications.     PLAN CHF/HTN: Continue currently medications (farxiga, entresto, metoprolol succ). I recommend repeating labs (Scr, K+, etc). If labs are stable at the next visit recommend add on spironolactone to further help with BP control or can increase the entresto.   DM:  Continue lantus with meal time insulin and metformin and farxiga.   COPD:  Patient states she  no longer takes her LABA. I educated her on the importance of taking the inhaler and to discuss with her doctor about risk and benefits. She has a pulmonology appointment soon per the niece.      Time spent: 30 minutes  Oswald Hillock, Pharm.D. Clinical Pharmacist 12/04/2022 12:29 PM    Current Outpatient Medications:    acetaminophen (TYLENOL) 500 MG tablet, Take 500 mg by mouth every 6 (six) hours as needed (back pain)., Disp: , Rfl:    albuterol (VENTOLIN HFA) 108 (90 Base) MCG/ACT inhaler, Inhale 2 puffs into the lungs every 6 (six) hours as needed for wheezing or shortness of breath., Disp: 8 g,  Rfl: 0   ALPRAZolam (XANAX) 0.25 MG tablet, Take 0.25 mg by mouth daily at 6 (six) AM., Disp: , Rfl:    ARIPiprazole (ABILIFY) 2 MG tablet, Take 2 mg by mouth daily., Disp: , Rfl:    dapagliflozin propanediol (FARXIGA) 10 MG TABS tablet, Take 1 tablet (10 mg total) by mouth daily before breakfast., Disp: 90 tablet, Rfl: 3   furosemide (LASIX) 20 MG tablet, Take 1 tablet (20 mg total) by mouth daily. (Patient taking differently: Take 40 mg by mouth daily.), Disp: 30 tablet, Rfl: 0   gabapentin (NEURONTIN) 300 MG capsule, Take 600-900 mg by mouth 4 (four) times daily. Take 2 capsules ('600mg'$ ) by mouth every morning and take 3 capsules ('900mg'$ ) by mouth every night, Disp: , Rfl:    insulin aspart (NOVOLOG) 100 UNIT/ML injection, Inject 5 Units into the skin 3 (three) times daily with meals., Disp: , Rfl:    insulin glargine (LANTUS) 100 UNIT/ML Solostar Pen, Inject 30 Units into the skin daily., Disp: 15 mL, Rfl: 0   Insulin Pen Needle 34G X 3.5 MM MISC, 1 Dose by Does not apply route daily., Disp: 100 each, Rfl: 0   metFORMIN (GLUCOPHAGE) 500 MG tablet, Take 1,000 mg by mouth 2 (two) times daily with a meal., Disp: , Rfl:    Multiple Vitamin (MULTIVITAMIN WITH MINERALS) TABS tablet, Take 1 tablet by mouth daily., Disp: , Rfl:    nortriptyline (PAMELOR) 25 MG capsule, Take 75 mg by mouth at bedtime., Disp: , Rfl:    omeprazole (PRILOSEC) 20 MG capsule, Take 20 mg by mouth daily., Disp: , Rfl:    potassium chloride SA (KLOR-CON M) 20 MEQ tablet, TAKE 1 TABLET BY MOUTH DAILY, Disp: 100 tablet, Rfl: 3   sacubitril-valsartan (ENTRESTO) 24-26 MG, Take 1 tablet by mouth 2 (two) times daily., Disp: 180 tablet, Rfl: 3   Secukinumab (COSENTYX [place]), Inject into the skin. Last date give was the week of 11/25/22. Pt unsure of exact date., Disp: , Rfl:    sertraline (ZOLOFT) 50 MG tablet, Take 50 mg by mouth daily., Disp: , Rfl:    vitamin B-12 (CYANOCOBALAMIN) 1000 MCG tablet, Take 1,000 mcg by mouth daily., Disp: ,  Rfl:    COUNSELING POINTS/CLINICAL PEARLS    DRUGS TO CAUTION IN HEART FAILURE  Drug or Class Mechanism  Analgesics NSAIDs COX-2 inhibitors Glucocorticoids  Sodium and water retention, increased systemic vascular resistance, decreased response to diuretics   Diabetes Medications Metformin Thiazolidinediones Rosiglitazone (Avandia) Pioglitazone (Actos) DPP4 Inhibitors Saxagliptin (Onglyza) Sitagliptin (Januvia)   Lactic acidosis Possible calcium channel blockade   Unknown  Antiarrhythmics Class I  Flecainide Disopyramide Class III Sotalol Other Dronedarone  Negative inotrope, proarrhythmic   Proarrhythmic, beta blockade  Negative inotrope  Antihypertensives Alpha Blockers Doxazosin Calcium Channel Blockers Diltiazem Verapamil Nifedipine Central Alpha Adrenergics Moxonidine  Peripheral Vasodilators Minoxidil  Increases renin and aldosterone  Negative inotrope    Possible sympathetic withdrawal  Unknown  Anti-infective Itraconazole Amphotericin B  Negative inotrope Unknown  Hematologic Anagrelide Cilostazol   Possible inhibition of PD IV Inhibition of PD III causing arrhythmias  Neurologic/Psychiatric Stimulants Anti-Seizure Drugs Carbamazepine Pregabalin Antidepressants Tricyclics Citalopram Parkinsons Bromocriptine Pergolide Pramipexole Antipsychotics Clozapine Antimigraine Ergotamine Methysergide Appetite suppressants Bipolar Lithium  Peripheral alpha and beta agonist activity  Negative inotrope and chronotrope Calcium channel blockade  Negative inotrope, proarrhythmic Dose-dependent QT prolongation  Excessive serotonin activity/valvular damage Excessive serotonin activity/valvular damage Unknown  IgE mediated hypersensitivy, calcium channel blockade  Excessive serotonin activity/valvular damage Excessive serotonin activity/valvular damage Valvular damage  Direct myofibrillar degeneration, adrenergic  stimulation  Antimalarials Chloroquine Hydroxychloroquine Intracellular inhibition of lysosomal enzymes  Urologic Agents Alpha Blockers Doxazosin Prazosin Tamsulosin Terazosin  Increased renin and aldosterone  Adapted from Page Carleene Overlie, et al. "Drugs That May Cause or Exacerbate Heart Failure: A Scientific Statement from the American Heart  Association." Circulation 2016; 134:e32-e69. DOI: 10.1161/CIR.0000000000000426   MEDICATION ADHERENCES TIPS AND STRATEGIES Taking medication as prescribed improves patient outcomes in heart failure (reduces hospitalizations, improves symptoms, increases survival) Side effects of medications can be managed by decreasing doses, switching agents, stopping drugs, or adding additional therapy. Please let someone in the [facility] know if you have having bothersome side effects so we can modify your regimen. Do not alter your medication regimen without talking to [place].  Medication reminders can help patients remember to take drugs on time. If you are missing or forgetting doses you can try linking behaviors, using pill boxes, or an electronic reminder like an alarm on your phone or an app. Some people can also get automated phone calls as medication reminders.

## 2022-12-04 NOTE — Telephone Encounter (Signed)
Sent re enrollment paperwork for [facility] for patient on 12/13. Pending approval for 2024   [place], [place]

## 2022-12-04 NOTE — Patient Instructions (Signed)
Continue weighing daily and call for an overnight weight gain of 3 pounds or more or a weekly weight gain of more than 5 pounds.   If you have voicemail, please make sure your mailbox is cleaned out so that we may leave a message and please make sure to listen to any voicemails.     

## 2023-01-06 NOTE — Progress Notes (Signed)
Patient ID: Kelsey Mcdowell, female    DOB: 15-Oct-1946, 77 y.o.   MRN: [id number]  HPI  Kelsey Mcdowell is a 77 yo with a PMHx of COPD, acute on chronic systolic CHF, DM II, tobacco abuse, obesity, and HTN.  Echo report from 01/28/22 reviewed and showed: EF 40-45% LV with mildly decreased function and global hypokinesis with mild LVH.   Has not been admitted or been in the ED in the last 6 months.   She presents today for a follow-up visit with a chief complaint of moderate fatigue with minimal exertion. Describes this as chronic in nature. Has associated shortness of breath, pedal edema (mostly in left leg), occasional palpitations and slight weight gain along with this. Denies any dizziness, abdominal distention, chest pain or cough.   Hasn't smoked in the last 3 weeks and no longer is wearing nicotine patches.   Past Medical History:  Diagnosis Date   Arthritis    COPD (chronic obstructive pulmonary disease) ([place])    Diabetes mellitus without complication (HCC)    GERD (gastroesophageal reflux disease)    Hyperlipemia    Hypertension    Myocardial infarction ([place])    light heart attack ?when   Neuropathy    Sleep apnea    Past Surgical History:  Procedure Laterality Date   ABDOMINAL HYSTERECTOMY     APPENDECTOMY     BREAST EXCISIONAL BIOPSY Left 1970's   benign   CHOLECYSTECTOMY     COLONOSCOPY WITH PROPOFOL N/A 09/30/2018   Procedure: COLONOSCOPY WITH PROPOFOL;  Surgeon: Toledo, Benay Pike, MD;  Location: [facility] ENDOSCOPY;  Service: Gastroenterology;  Laterality: N/A;   COLONOSCOPY WITH PROPOFOL N/A 07/09/2022   Procedure: COLONOSCOPY WITH PROPOFOL;  Surgeon: [name], MD;  Location: [facility] ENDOSCOPY;  Service: Endoscopy;  Laterality: N/A;  IDDM   ESOPHAGOGASTRODUODENOSCOPY (EGD) WITH PROPOFOL N/A 07/09/2022   Procedure: ESOPHAGOGASTRODUODENOSCOPY (EGD) WITH PROPOFOL;  Surgeon: [name], MD;  Location: [facility] ENDOSCOPY;  Service: Endoscopy;   Laterality: N/A;   SPINE SURGERY  1998   cervical fusion   Family History  Problem Relation Age of Onset   Cancer Mother    Hypertension Mother    Diabetes Mother    Heart disease Father    Diabetes Father    Cancer Sister    Breast cancer Sister 73   Heart disease Brother    Social History   Tobacco Use   Smoking status: Some Days    Types: Cigarettes   Smokeless tobacco: Never  Substance Use Topics   Alcohol use: Yes    Comment: occasional   Allergies  Allergen Reactions   Amoxicillin Other (See Comments)    Other Reaction: GI upset, cramps   Prior to Admission medications   Medication Sig Start Date End Date Taking? Authorizing Provider  acetaminophen (TYLENOL) 500 MG tablet Take 500 mg by mouth every 6 (six) hours as needed (back pain).   Yes [provider]  ALPRAZolam (XANAX) 0.25 MG tablet Take 0.25 mg by mouth daily at 6 (six) AM.   Yes [provider]  ARIPiprazole (ABILIFY) 2 MG tablet Take 2 mg by mouth daily. 11/17/21  Yes [provider]  dapagliflozin propanediol (FARXIGA) 10 MG TABS tablet Take 1 tablet (10 mg total) by mouth daily before breakfast. 09/05/22  Yes Darylene Price A, FNP  furosemide (LASIX) 20 MG tablet Take 1 tablet (20 mg total) by mouth daily. Patient taking differently: Take 40 mg by mouth daily. 01/29/22  Yes McMurray,  Richard, MD  gabapentin (NEURONTIN) 300 MG capsule Take 600-900 mg by mouth 4 (four) times daily. Take 2 capsules ('600mg'$ ) by mouth every morning and take 3 capsules ('900mg'$ ) by mouth every night   Yes [provider]  insulin aspart (NOVOLOG) 100 UNIT/ML injection Inject 7 Units into the skin 3 (three) times daily with meals.   Yes [provider]  insulin glargine (LANTUS) 100 UNIT/ML Solostar Pen Inject 30 Units into the skin daily. Patient taking differently: Inject 20-30 Units into the skin daily. 01/29/22  Yes Wieting, Richard, MD  Insulin Pen Needle 34G X 3.5 MM MISC 1 Dose by Does  not apply route daily. 01/29/22  Yes Wieting, Richard, MD  metFORMIN (GLUCOPHAGE) 500 MG tablet Take 1,000 mg by mouth 2 (two) times daily with a meal.   Yes [provider]  metoprolol succinate (TOPROL-XL) 25 MG 24 hr tablet Take 25 mg by mouth daily.   Yes [provider]  Multiple Vitamin (MULTIVITAMIN WITH MINERALS) TABS tablet Take 1 tablet by mouth daily.   Yes [provider]  nortriptyline (PAMELOR) 25 MG capsule Take 75 mg by mouth at bedtime.   Yes [provider]  omeprazole (PRILOSEC) 20 MG capsule Take 20 mg by mouth daily. 11/17/21  Yes [provider]  potassium chloride SA (KLOR-CON M) 20 MEQ tablet TAKE 1 TABLET BY MOUTH DAILY 10/16/22  Yes Pearlie Nies A, FNP  sacubitril-valsartan (ENTRESTO) 24-26 MG Take 1 tablet by mouth 2 (two) times daily. 12/04/22  Yes Kedron Uno A, FNP  Secukinumab (COSENTYX [place]) Inject into the skin every 30 (thirty) days. Last date give was the week of 11/25/22. Pt unsure of exact date.   Yes [provider]  sertraline (ZOLOFT) 50 MG tablet Take 50 mg by mouth daily. 11/17/21  Yes [provider]  vitamin B-12 (CYANOCOBALAMIN) 1000 MCG tablet Take 1,000 mcg by mouth daily.   Yes [provider]  albuterol (VENTOLIN HFA) 108 (90 Base) MCG/ACT inhaler Inhale 2 puffs into the lungs every 6 (six) hours as needed for wheezing or shortness of breath. Patient not taking: Reported on 01/08/2023 01/29/22   Loletha Grayer, MD   Review of Systems  Constitutional:  Positive for fatigue (easily). Negative for appetite change.  HENT:  Negative for congestion, postnasal drip and sore throat.   Eyes:  Positive for visual disturbance (blurry vision).  Respiratory:  Positive for shortness of breath (easily). Negative for cough, chest tightness and wheezing.   Cardiovascular:  Positive for palpitations (at times) and leg swelling (left leg). Negative for chest pain.  Gastrointestinal:  Negative for  abdominal distention and abdominal pain.  Endocrine: Negative.   Genitourinary: Negative.   Musculoskeletal:  Negative for back pain and neck pain.  Skin: Negative.   Allergic/Immunologic: Negative.   Neurological:  Negative for dizziness and light-headedness.  Hematological:  Negative for adenopathy. Does not bruise/bleed easily.  Psychiatric/Behavioral:  Negative for dysphoric mood and sleep disturbance (sleeping on 1 pillow). The patient is not nervous/anxious.    Vitals:   01/08/23 1007  BP: (!) 158/59  Pulse: 79  Resp: 18  SpO2: 92%  Weight: 198 lb 2 oz (89.9 kg)   Wt Readings from Last 3 Encounters:  01/08/23 198 lb 2 oz (89.9 kg)  12/04/22 192 lb (87.1 kg)  09/05/22 187 lb 8 oz (85 kg)   Lab Results  Component Value Date   CREATININE 1.47 (H) 12/04/2022   CREATININE 1.56 (H) 09/11/2022   CREATININE 1.57 (  H) 09/11/2022   Physical Exam Vitals and nursing note reviewed. Exam conducted with a chaperone present (niece).  Constitutional:      Appearance: Normal appearance.  HENT:     Head: Normocephalic and atraumatic.  Cardiovascular:     Rate and Rhythm: Normal rate and regular rhythm.  Pulmonary:     Effort: Pulmonary effort is normal. No respiratory distress.     Breath sounds: No wheezing or rales.  Abdominal:     General: There is no distension.     Palpations: Abdomen is soft.  Musculoskeletal:        General: No tenderness.     Cervical back: Normal range of motion and neck supple.     Right lower leg: No tenderness. No edema.     Left lower leg: No tenderness. Edema (trace pitting) present.  Neurological:     General: No focal deficit present.     Mental Status: She is alert and oriented to person, place, and time.  Psychiatric:        Mood and Affect: Mood normal.        Behavior: Behavior normal.    Assessment and Plan:  Chronic heart failure with mildly reduced ejection fraction- - NYHA class III - euvolemic - weighing most days but admits to  not weighing daily; reviewed the importance of weighing daily and to call for an overnight weight gain of > 2 pounds or a weekly weight gain of > 5 pounds - weight up 6 pounds from last visit here 1 month ago (recently quit smoking) - reviewed keeping daily fluid intake to 60-64 ounces - on GDMT of entresto - reviewed BMP/ LFT's with patient and niece; patient is agreeable to other GDMT - will begin spironolactone 12.'5mg'$  daily with stoppage of potassium supplements - BMP in 1 week and then in 1 month - saw cardiology Margarito Courser) 12/26/22 - BNP 01/27/22 721.9 - PharmD reconciled medications with the patient  Hypertension- - BP 158/59 - saw PCP Clemmie Krill)  - BMP 12/04/22 reviewed and showed sodium 138, potassium 4.2, creatinine 1.47 and GFR 37  COPD- - saw pulmonologist Raul Del) 12/11/22 - no smoking for ~ 3 weeks and is no longer using nicotine patches - continued cessation discussed for 3 minutes - on O2 @ 2L 24/7 although came without it today  DM- - glucose at home this morning was 76 - supposed to be getting glucose monitoring device - A1c 07/03/22 was 8.9% (per PCP note)   Medication list reviewed  Return in 1 month, sooner if needed.

## 2023-01-08 ENCOUNTER — Other Ambulatory Visit: Payer: Self-pay | Admitting: Family

## 2023-01-08 ENCOUNTER — Ambulatory Visit: Payer: Medicare Other | Attending: Family | Admitting: Family

## 2023-01-08 ENCOUNTER — Encounter: Payer: Self-pay | Admitting: Family

## 2023-01-08 ENCOUNTER — Encounter: Payer: Self-pay | Admitting: Pharmacy Technician

## 2023-01-08 VITALS — BP 158/59 | HR 79 | Resp 18 | Wt 198.1 lb

## 2023-01-08 DIAGNOSIS — E1122 Type 2 diabetes mellitus with diabetic chronic kidney disease: Secondary | ICD-10-CM | POA: Diagnosis not present

## 2023-01-08 DIAGNOSIS — I10 Essential (primary) hypertension: Secondary | ICD-10-CM | POA: Diagnosis not present

## 2023-01-08 DIAGNOSIS — Z87891 Personal history of nicotine dependence: Secondary | ICD-10-CM | POA: Insufficient documentation

## 2023-01-08 DIAGNOSIS — J449 Chronic obstructive pulmonary disease, unspecified: Secondary | ICD-10-CM

## 2023-01-08 DIAGNOSIS — E1140 Type 2 diabetes mellitus with diabetic neuropathy, unspecified: Secondary | ICD-10-CM | POA: Diagnosis not present

## 2023-01-08 DIAGNOSIS — E669 Obesity, unspecified: Secondary | ICD-10-CM | POA: Insufficient documentation

## 2023-01-08 DIAGNOSIS — N1831 Chronic kidney disease, stage 3a: Secondary | ICD-10-CM

## 2023-01-08 DIAGNOSIS — Z794 Long term (current) use of insulin: Secondary | ICD-10-CM

## 2023-01-08 DIAGNOSIS — I5022 Chronic systolic (congestive) heart failure: Secondary | ICD-10-CM

## 2023-01-08 DIAGNOSIS — I110 Hypertensive heart disease with heart failure: Secondary | ICD-10-CM | POA: Insufficient documentation

## 2023-01-08 MED ORDER — SPIRONOLACTONE 25 MG PO TABS: 12.5000 mg | ORAL_TABLET | Freq: Every day | ORAL | 3 refills | Status: DC

## 2023-01-08 NOTE — Progress Notes (Signed)
[facility] - PHARMACIST COUNSELING NOTE  Guideline-Directed Medical Therapy/Evidence Based Medicine  ACE/ARB/ARNI: Sacubitril-valsartan 24-26 mg twice daily Beta Blocker: Metoprolol succinate 25 mg daily Aldosterone Antagonist:  None Diuretic: Furosemide 40 mg daily SGLT2i: Dapagliflozin 10 mg daily  Adherence Assessment  Do you ever forget to take your medication? '[]'$ Yes '[x]'$ No  Do you ever skip doses due to side effects? '[]'$ Yes '[x]'$ No  Do you have trouble affording your medicines? '[x]'$ Yes '[]'$ No  Are you ever unable to pick up your medication due to transportation difficulties? '[]'$ Yes '[x]'$ No  Do you ever stop taking your medications because you don't believe they are helping? '[]'$ Yes '[x]'$ No  Do you check your weight daily? '[]'$ Yes '[x]'$ No   Adherence strategy: Pill box  Barriers to obtaining medications: Patient cannot afford her inhalers  Vital signs: HR 79, BP 158/59, weight (pounds) 198 ECHO: Date 01/2022, EF 40-45%     Latest Ref Rng & Units 12/04/2022   12:02 PM 09/11/2022   10:45 AM 09/11/2022   10:40 AM  BMP  Glucose 70 - 99 mg/dL 176  173  178   BUN 8 - 23 mg/dL '19  22  22   '$ Creatinine 0.44 - 1.00 mg/dL 1.47  1.56  1.57   Sodium 135 - 145 mmol/L 138  139  139   Potassium 3.5 - 5.1 mmol/L 4.2  4.3  4.4   Chloride 98 - 111 mmol/L 104  105  105   CO2 22 - 32 mmol/L '26  26  26   '$ Calcium 8.9 - 10.3 mg/dL 9.5  9.1  9.1     Past Medical History:  Diagnosis Date   Arthritis    COPD (chronic obstructive pulmonary disease) (HCC)    Diabetes mellitus without complication (HCC)    GERD (gastroesophageal reflux disease)    Hyperlipemia    Hypertension    Myocardial infarction ([place])    light heart attack ?when   Neuropathy    Sleep apnea     ASSESSMENT 77 year old female with PMH COPD, T2DM, former tobacco use (recently quit) who presents to the HF clinic for follow-up. Most recent ECHO in 01/2022 shows EF 40-45%. Regarding GDMT,  patient takes Farxiga 10 mg daily, Entresto 24/'26mg'$  twice daily, and Toprol XL 25 mg daily. Patient also takes furosemide 40 mg daily and potassium 20 mEq daily. Patient endorses that she continues to have swelling in her leg. Patient asked about how to get started with CGM monitoring. We recommended patient to contact her PCP's office, Dr. Clemmie Krill, to proceed with that.  Patient has not been using her LABA nor her albuterol due to not being able to afford them. She receives some of her brand name medications through PAPs.  Recent ED Visit (past 6 months): None  PLAN CHF/HTN Recommend initiation of spironolactone 12.5 mg daily. This dose chosen due to eGFR 30-50. Recommend checking BMP in one week to assess potassium level. Hopefully the spironolactone will enable [place] to lower or discontinue potassium supplementation. Continue Ferne Coe, Toprol XL, Lasix  T2DM 06/2022 A1c 8.9% Continue metformin 1000 mg twice daily   Time spent: 15 minutes  Will M. Ouida Sills, PharmD PGY-1 Pharmacy Resident 01/08/2023 11:26 AM    Current Outpatient Medications:    acetaminophen (TYLENOL) 500 MG tablet, Take 500 mg by mouth every 6 (six) hours as needed (back pain)., Disp: , Rfl:    albuterol (VENTOLIN HFA) 108 (90 Base) MCG/ACT inhaler, Inhale 2 puffs into the lungs every  6 (six) hours as needed for wheezing or shortness of breath. (Patient not taking: Reported on 01/08/2023), Disp: 8 g, Rfl: 0   ALPRAZolam (XANAX) 0.25 MG tablet, Take 0.25 mg by mouth daily at 6 (six) AM., Disp: , Rfl:    ARIPiprazole (ABILIFY) 2 MG tablet, Take 2 mg by mouth daily., Disp: , Rfl:    dapagliflozin propanediol (FARXIGA) 10 MG TABS tablet, Take 1 tablet (10 mg total) by mouth daily before breakfast., Disp: 90 tablet, Rfl: 3   furosemide (LASIX) 20 MG tablet, Take 1 tablet (20 mg total) by mouth daily. (Patient taking differently: Take 40 mg by mouth daily.), Disp: 30 tablet, Rfl: 0   gabapentin (NEURONTIN) 300 MG  capsule, Take 600-900 mg by mouth 4 (four) times daily. Take 2 capsules ('600mg'$ ) by mouth every morning and take 3 capsules ('900mg'$ ) by mouth every night, Disp: , Rfl:    insulin aspart (NOVOLOG) 100 UNIT/ML injection, Inject 7 Units into the skin 3 (three) times daily with meals., Disp: , Rfl:    insulin glargine (LANTUS) 100 UNIT/ML Solostar Pen, Inject 30 Units into the skin daily. (Patient taking differently: Inject 20-30 Units into the skin daily.), Disp: 15 mL, Rfl: 0   Insulin Pen Needle 34G X 3.5 MM MISC, 1 Dose by Does not apply route daily., Disp: 100 each, Rfl: 0   metFORMIN (GLUCOPHAGE) 500 MG tablet, Take 1,000 mg by mouth 2 (two) times daily with a meal., Disp: , Rfl:    metoprolol succinate (TOPROL-XL) 25 MG 24 hr tablet, Take 25 mg by mouth daily., Disp: , Rfl:    Multiple Vitamin (MULTIVITAMIN WITH MINERALS) TABS tablet, Take 1 tablet by mouth daily., Disp: , Rfl:    nortriptyline (PAMELOR) 25 MG capsule, Take 75 mg by mouth at bedtime., Disp: , Rfl:    omeprazole (PRILOSEC) 20 MG capsule, Take 20 mg by mouth daily., Disp: , Rfl:    sacubitril-valsartan (ENTRESTO) 24-26 MG, Take 1 tablet by mouth 2 (two) times daily., Disp: 180 tablet, Rfl: 3   Secukinumab (COSENTYX [place]), Inject into the skin every 30 (thirty) days. Last date give was the week of 11/25/22. Pt unsure of exact date., Disp: , Rfl:    sertraline (ZOLOFT) 50 MG tablet, Take 50 mg by mouth daily., Disp: , Rfl:    spironolactone (ALDACTONE) 25 MG tablet, Take 0.5 tablets (12.5 mg total) by mouth daily., Disp: 15 tablet, Rfl: 3   vitamin B-12 (CYANOCOBALAMIN) 1000 MCG tablet, Take 1,000 mcg by mouth daily., Disp: , Rfl:    DRUGS TO CAUTION IN HEART FAILURE  Drug or Class Mechanism  Analgesics NSAIDs COX-2 inhibitors Glucocorticoids  Sodium and water retention, increased systemic vascular resistance, decreased response to diuretics   Diabetes Medications Metformin Thiazolidinediones Rosiglitazone  (Avandia) Pioglitazone (Actos) DPP4 Inhibitors Saxagliptin (Onglyza) Sitagliptin (Januvia)   Lactic acidosis Possible calcium channel blockade   Unknown  Antiarrhythmics Class I  Flecainide Disopyramide Class III Sotalol Other Dronedarone  Negative inotrope, proarrhythmic   Proarrhythmic, beta blockade  Negative inotrope  Antihypertensives Alpha Blockers Doxazosin Calcium Channel Blockers Diltiazem Verapamil Nifedipine Central Alpha Adrenergics Moxonidine Peripheral Vasodilators Minoxidil  Increases renin and aldosterone  Negative inotrope    Possible sympathetic withdrawal  Unknown  Anti-infective Itraconazole Amphotericin B  Negative inotrope Unknown  Hematologic Anagrelide Cilostazol   Possible inhibition of PD IV Inhibition of PD III causing arrhythmias  Neurologic/Psychiatric Stimulants Anti-Seizure Drugs Carbamazepine Pregabalin Antidepressants Tricyclics Citalopram Parkinsons Bromocriptine Pergolide Pramipexole Antipsychotics Clozapine Antimigraine Ergotamine Methysergide Appetite suppressants Bipolar  Lithium  Peripheral alpha and beta agonist activity  Negative inotrope and chronotrope Calcium channel blockade  Negative inotrope, proarrhythmic Dose-dependent QT prolongation  Excessive serotonin activity/valvular damage Excessive serotonin activity/valvular damage Unknown  IgE mediated hypersensitivy, calcium channel blockade  Excessive serotonin activity/valvular damage Excessive serotonin activity/valvular damage Valvular damage  Direct myofibrillar degeneration, adrenergic stimulation  Antimalarials Chloroquine Hydroxychloroquine Intracellular inhibition of lysosomal enzymes  Urologic Agents Alpha Blockers Doxazosin Prazosin Tamsulosin Terazosin  Increased renin and aldosterone  Adapted from Page Carleene Overlie, et al. "Drugs That May Cause or Exacerbate Heart Failure: A Scientific Statement from the American Heart   Association." Circulation 2016; 134:e32-e69. DOI: 10.1161/CIR.0000000000000426   MEDICATION ADHERENCES TIPS AND STRATEGIES Taking medication as prescribed improves patient outcomes in heart failure (reduces hospitalizations, improves symptoms, increases survival) Side effects of medications can be managed by decreasing doses, switching agents, stopping drugs, or adding additional therapy. Please let someone in the [facility] know if you have having bothersome side effects so we can modify your regimen. Do not alter your medication regimen without talking to [place].  Medication reminders can help patients remember to take drugs on time. If you are missing or forgetting doses you can try linking behaviors, using pill boxes, or an electronic reminder like an alarm on your phone or an app. Some people can also get automated phone calls as medication reminders.

## 2023-01-08 NOTE — Patient Instructions (Addendum)
Continue weighing daily and call for an overnight weight gain of 3 pounds or more or a weekly weight gain of more than 5 pounds.   If you have voicemail, please make sure your mailbox is cleaned out so that we may leave a message and please make sure to listen to any voicemails.    Begin taking spironolactone as 1/2 tablet every morning. When you start this medication, do not take anymore potassium tablets.    Return on Wednesday 01/16/23 at 10:00am to get labs drawn at [facility] which is on the 1st floor of this building.   [employer] Patient AssistanceDelene Loll)- [phone]

## 2023-01-16 ENCOUNTER — Encounter
Admission: RE | Admit: 2023-01-16 | Discharge: 2023-01-16 | Disposition: A | Discharge status: Home / Self Care | Payer: Medicare Other | Source: Ambulatory Visit | Attending: Family | Admitting: Family

## 2023-01-16 ENCOUNTER — Telehealth: Payer: Self-pay | Admitting: Family

## 2023-01-16 DIAGNOSIS — I5022 Chronic systolic (congestive) heart failure: Secondary | ICD-10-CM

## 2023-01-16 LAB — BASIC METABOLIC PANEL
Anion gap: 11 (ref 5–15)
BUN: 25 mg/dL — ABNORMAL HIGH (ref 8–23)
CO2: 26 mmol/L (ref 22–32)
Calcium: 8.8 mg/dL — ABNORMAL LOW (ref 8.9–10.3)
Chloride: 100 mmol/L (ref 98–111)
Creatinine, Ser: 1.69 mg/dL — ABNORMAL HIGH (ref 0.44–1.00)
GFR, Estimated: 31 mL/min — ABNORMAL LOW (ref >60)
Glucose, Bld: 192 mg/dL — ABNORMAL HIGH (ref 70–99)
Potassium: 4 mmol/L (ref 3.5–5.1)
Sodium: 137 mmol/L (ref 135–145)

## 2023-01-16 NOTE — Telephone Encounter (Signed)
LM on patient's voicemail to continue all meds and labs will be rechecked at her next visit on 02/06/23.

## 2023-01-21 ENCOUNTER — Encounter
Admission: RE | Disposition: A | Discharge status: Home / Self Care | Payer: Self-pay | Source: Home / Self Care | Attending: Gastroenterology

## 2023-01-21 ENCOUNTER — Ambulatory Visit: Payer: Medicare Other | Admitting: Certified Registered Nurse Anesthetist

## 2023-01-21 ENCOUNTER — Ambulatory Visit
Admission: RE | Admit: 2023-01-21 | Discharge: 2023-01-21 | Disposition: A | Discharge status: Home / Self Care | Payer: Medicare Other | Attending: Gastroenterology | Admitting: Gastroenterology

## 2023-01-21 ENCOUNTER — Other Ambulatory Visit: Payer: Self-pay

## 2023-01-21 ENCOUNTER — Encounter: Payer: Self-pay | Admitting: *Deleted

## 2023-01-21 DIAGNOSIS — Z09 Encounter for follow-up examination after completed treatment for conditions other than malignant neoplasm: Secondary | ICD-10-CM | POA: Diagnosis present

## 2023-01-21 DIAGNOSIS — D120 Benign neoplasm of cecum: Secondary | ICD-10-CM | POA: Diagnosis not present

## 2023-01-21 DIAGNOSIS — K219 Gastro-esophageal reflux disease without esophagitis: Secondary | ICD-10-CM | POA: Diagnosis not present

## 2023-01-21 DIAGNOSIS — D123 Benign neoplasm of transverse colon: Secondary | ICD-10-CM | POA: Insufficient documentation

## 2023-01-21 DIAGNOSIS — I5022 Chronic systolic (congestive) heart failure: Secondary | ICD-10-CM | POA: Diagnosis not present

## 2023-01-21 DIAGNOSIS — I110 Hypertensive heart disease with heart failure: Secondary | ICD-10-CM | POA: Diagnosis not present

## 2023-01-21 DIAGNOSIS — G4733 Obstructive sleep apnea (adult) (pediatric): Secondary | ICD-10-CM | POA: Diagnosis not present

## 2023-01-21 DIAGNOSIS — J449 Chronic obstructive pulmonary disease, unspecified: Secondary | ICD-10-CM | POA: Diagnosis not present

## 2023-01-21 DIAGNOSIS — G4730 Sleep apnea, unspecified: Secondary | ICD-10-CM | POA: Diagnosis not present

## 2023-01-21 DIAGNOSIS — F32A Depression, unspecified: Secondary | ICD-10-CM | POA: Insufficient documentation

## 2023-01-21 DIAGNOSIS — Z87891 Personal history of nicotine dependence: Secondary | ICD-10-CM | POA: Diagnosis not present

## 2023-01-21 DIAGNOSIS — M1990 Unspecified osteoarthritis, unspecified site: Secondary | ICD-10-CM | POA: Insufficient documentation

## 2023-01-21 DIAGNOSIS — K640 First degree hemorrhoids: Secondary | ICD-10-CM | POA: Diagnosis not present

## 2023-01-21 DIAGNOSIS — E669 Obesity, unspecified: Secondary | ICD-10-CM | POA: Diagnosis not present

## 2023-01-21 DIAGNOSIS — Z86010 Personal history of colonic polyps: Secondary | ICD-10-CM | POA: Diagnosis not present

## 2023-01-21 DIAGNOSIS — Z79899 Other long term (current) drug therapy: Secondary | ICD-10-CM | POA: Insufficient documentation

## 2023-01-21 DIAGNOSIS — Z794 Long term (current) use of insulin: Secondary | ICD-10-CM | POA: Insufficient documentation

## 2023-01-21 DIAGNOSIS — E119 Type 2 diabetes mellitus without complications: Secondary | ICD-10-CM | POA: Insufficient documentation

## 2023-01-21 DIAGNOSIS — K5730 Diverticulosis of large intestine without perforation or abscess without bleeding: Secondary | ICD-10-CM | POA: Insufficient documentation

## 2023-01-21 DIAGNOSIS — E785 Hyperlipidemia, unspecified: Secondary | ICD-10-CM | POA: Insufficient documentation

## 2023-01-21 DIAGNOSIS — I252 Old myocardial infarction: Secondary | ICD-10-CM | POA: Diagnosis not present

## 2023-01-21 DIAGNOSIS — G709 Myoneural disorder, unspecified: Secondary | ICD-10-CM | POA: Insufficient documentation

## 2023-01-21 DIAGNOSIS — Z6836 Body mass index (BMI) 36.0-36.9, adult: Secondary | ICD-10-CM | POA: Insufficient documentation

## 2023-01-21 DIAGNOSIS — Z7984 Long term (current) use of oral hypoglycemic drugs: Secondary | ICD-10-CM | POA: Insufficient documentation

## 2023-01-21 HISTORY — PX: COLONOSCOPY WITH PROPOFOL: SHX5780

## 2023-01-21 HISTORY — DX: Heart failure, unspecified: I50.9

## 2023-01-21 LAB — GLUCOSE, CAPILLARY: Glucose-Capillary: 152 mg/dL — ABNORMAL HIGH (ref 70–99)

## 2023-01-21 SURGERY — COLONOSCOPY WITH PROPOFOL
Anesthesia: General

## 2023-01-21 MED ORDER — PROPOFOL 10 MG/ML IV BOLUS
INTRAVENOUS | Status: DC | PRN
  Administered 2023-01-21: 60 mg via INTRAVENOUS
  Administered 2023-01-21: 10 mg via INTRAVENOUS

## 2023-01-21 MED ORDER — PROPOFOL 10 MG/ML IV BOLUS
INTRAVENOUS | Status: AC
  Filled 2023-01-21: qty 20

## 2023-01-21 MED ORDER — SODIUM CHLORIDE 0.9 % IV SOLN: INTRAVENOUS | Status: DC

## 2023-01-21 MED ORDER — PROPOFOL 500 MG/50ML IV EMUL
INTRAVENOUS | Status: DC | PRN
  Administered 2023-01-21: 120 ug/kg/min via INTRAVENOUS

## 2023-01-21 MED ORDER — SPOT INK MARKER SYRINGE KIT
PACK | SUBMUCOSAL | Status: DC | PRN
  Administered 2023-01-21: 2 mL via SUBMUCOSAL

## 2023-01-21 NOTE — H&P (Signed)
Outpatient short stay form Pre-procedure 01/21/2023  [name], MD  Primary Physician: Lynnell Jude, MD  Reason for visit:  History of polyps, poor prep  History of present illness:    77 y/o lady with history of COPD on 2 L of home O2, DM II, HFrEF, and OSA here for colonoscopy due to polyps and poor prep on last colonoscopy done < 1 year ago. No blood thinners. History of hysterectomy, appendectomy, and fistula repair. No blood thinners. No significant family history of GI malignancies.    Current Facility-Administered Medications:    0.9 %  sodium chloride infusion, , Intravenous, Continuous, Lorenda Grecco, Hilton Cork, MD, Last Rate: 20 mL/hr at 01/21/23 1238, New Bag at 01/21/23 1238  Medications Prior to Admission  Medication Sig Dispense Refill Last Dose   acetaminophen (TYLENOL) 500 MG tablet Take 500 mg by mouth every 6 (six) hours as needed (back pain).   Past Week   ALPRAZolam (XANAX) 0.25 MG tablet Take 0.25 mg by mouth daily at 6 (six) AM.   Past Week   ARIPiprazole (ABILIFY) 2 MG tablet Take 2 mg by mouth daily.   Past Week   dapagliflozin propanediol (FARXIGA) 10 MG TABS tablet Take 1 tablet (10 mg total) by mouth daily before breakfast. 90 tablet 3 01/21/2023 at 1000   furosemide (LASIX) 20 MG tablet Take 1 tablet (20 mg total) by mouth daily. (Patient taking differently: Take 40 mg by mouth daily.) 30 tablet 0 01/20/2023   gabapentin (NEURONTIN) 300 MG capsule Take 600-900 mg by mouth 4 (four) times daily. Take 2 capsules ('600mg'$ ) by mouth every morning and take 3 capsules ('900mg'$ ) by mouth every night   01/20/2023 at 2200   insulin aspart (NOVOLOG) 100 UNIT/ML injection Inject 7 Units into the skin 3 (three) times daily with meals.   Past Week   insulin glargine (LANTUS) 100 UNIT/ML Solostar Pen Inject 30 Units into the skin daily. (Patient taking differently: Inject 20-30 Units into the skin daily.) 15 mL 0 Past Week   Insulin Pen Needle 34G X 3.5 MM MISC 1 Dose by Does  not apply route daily. 100 each 0 Past Week   metFORMIN (GLUCOPHAGE) 500 MG tablet Take 1,000 mg by mouth 2 (two) times daily with a meal.   01/20/2023   metoprolol succinate (TOPROL-XL) 25 MG 24 hr tablet Take 25 mg by mouth daily.   01/20/2023 at 0800   Multiple Vitamin (MULTIVITAMIN WITH MINERALS) TABS tablet Take 1 tablet by mouth daily.   Past Week   nortriptyline (PAMELOR) 25 MG capsule Take 75 mg by mouth at bedtime.   01/20/2023 at 2200   omeprazole (PRILOSEC) 20 MG capsule Take 20 mg by mouth daily.   01/20/2023 at 0800   sacubitril-valsartan (ENTRESTO) 24-26 MG Take 1 tablet by mouth 2 (two) times daily. 180 tablet 3 01/21/2023 at 1000   Secukinumab (COSENTYX [place]) Inject into the skin every 30 (thirty) days. Last date give was the week of 11/25/22. Pt unsure of exact date.   Past Week   sertraline (ZOLOFT) 50 MG tablet Take 50 mg by mouth daily.   01/20/2023   spironolactone (ALDACTONE) 25 MG tablet Take 0.5 tablets (12.5 mg total) by mouth daily. 15 tablet 3 01/20/2023   vitamin B-12 (CYANOCOBALAMIN) 1000 MCG tablet Take 1,000 mcg by mouth daily.   Past Week   albuterol (VENTOLIN HFA) 108 (90 Base) MCG/ACT inhaler Inhale 2 puffs into the lungs every 6 (six) hours as needed for wheezing or  shortness of breath. (Patient not taking: Reported on 01/08/2023) 8 g 0 Not Taking     Allergies  Allergen Reactions   Amoxicillin Other (See Comments)    Other Reaction: GI upset, cramps     Past Medical History:  Diagnosis Date   Arthritis    CHF (congestive heart failure) (HCC)    COPD (chronic obstructive pulmonary disease) (HCC)    Diabetes mellitus without complication (HCC)    GERD (gastroesophageal reflux disease)    Hyperlipemia    Hypertension    Myocardial infarction ([place])    light heart attack ?when   Neuropathy    Sleep apnea     Review of systems:  Otherwise negative.    Physical Exam  Gen: Alert, oriented. Appears stated age.  HEENT: PERRLA. Lungs: No respiratory  distress CV: RRR Abd: soft, benign, no masses Ext: No edema    Planned procedures: Proceed with colonoscopy. The patient understands the nature of the planned procedure, indications, risks, alternatives and potential complications including but not limited to bleeding, infection, perforation, damage to internal organs and possible oversedation/side effects from anesthesia. The patient agrees and gives consent to proceed.  Please refer to procedure notes for findings, recommendations and patient disposition/instructions.     [name], MD [facility]

## 2023-01-21 NOTE — Anesthesia Procedure Notes (Signed)
Date/Time: 01/21/2023 1:00 PM  Performed by: Demetrius Charity, CRNAPre-anesthesia Checklist: Patient identified, Emergency Drugs available, Suction available, Patient being monitored and Timeout performed Patient Re-evaluated:Patient Re-evaluated prior to induction Oxygen Delivery Method: Nasal cannula Induction Type: IV induction Placement Confirmation: CO2 detector and positive ETCO2

## 2023-01-21 NOTE — Interval H&P Note (Signed)
History and Physical Interval Note:  01/21/2023 12:54 PM  Kelsey Mcdowell  has presented today for surgery, with the diagnosis of Hx of Adenomatous Polyps.  The various methods of treatment have been discussed with the patient and family. After consideration of risks, benefits and other options for treatment, the patient has consented to  Procedure(s): COLONOSCOPY WITH PROPOFOL (N/A) as a surgical intervention.  The patient's history has been reviewed, patient examined, no change in status, stable for surgery.  I have reviewed the patient's chart and labs.  Questions were answered to the patient's satisfaction.     [name]  Ok to proceed with colonoscopy

## 2023-01-21 NOTE — Anesthesia Postprocedure Evaluation (Signed)
Anesthesia Post Note  Patient: Kelsey Mcdowell  Procedure(s) Performed: COLONOSCOPY WITH PROPOFOL  Patient location during evaluation: Endoscopy Anesthesia Type: General Level of consciousness: awake and alert Pain management: pain level controlled Vital Signs Assessment: post-procedure vital signs reviewed and stable Respiratory status: spontaneous breathing, nonlabored ventilation, respiratory function stable and patient connected to nasal cannula oxygen Cardiovascular status: blood pressure returned to baseline and stable Postop Assessment: no apparent nausea or vomiting Anesthetic complications: no   No notable events documented.   Last Vitals:  Vitals:   01/21/23 1348 01/21/23 1355  BP:  (!) 148/67  Pulse:  98  Resp:  20  Temp:    SpO2: 95% 95%    Last Pain:  Vitals:   01/21/23 1355  TempSrc:   PainSc: 0-No pain                 Arita Miss

## 2023-01-21 NOTE — Transfer of Care (Signed)
Immediate Anesthesia Transfer of Care Note  Patient: Kelsey Mcdowell  Procedure(s) Performed: COLONOSCOPY WITH PROPOFOL  Patient Location: PACU  Anesthesia Type:General  Level of Consciousness: drowsy  Airway & Oxygen Therapy: Patient Spontanous Breathing and Patient connected to nasal cannula oxygen  Post-op Assessment: Report given to RN and Post -op Vital signs reviewed and stable  Post vital signs: Reviewed and stable  Last Vitals:  Vitals Value Taken Time  BP 143/68 01/21/23 1345  Temp    Pulse 100 01/21/23 1345  Resp 14 01/21/23 1345  SpO2 100 % 01/21/23 1345    Last Pain:  Vitals:   01/21/23 1345  TempSrc:   PainSc: Asleep         Complications: No notable events documented.

## 2023-01-21 NOTE — Op Note (Signed)
[facility] Patient Name: Kelsey Mcdowell Procedure Date: 01/21/2023 12:17 PM MRN: [id number] Account #: [id number] Date of Birth: 21-May-1946 Admit Type: Outpatient Age: 77 Room: [facility] ENDO ROOM 1 Gender: Female Note Status: Finalized Instrument Name: Jasper Riling [id number] Procedure:             Colonoscopy Indications:           Surveillance: Personal history of piecemeal removal of                         adenoma on last colonoscopy 6 months ago Providers:             Andrey Farmer MD, MD Referring MD:          Lynnell Jude (Referring MD) Medicines:             Monitored Anesthesia Care Complications:         No immediate complications. Estimated blood loss:                         Minimal. Procedure:             Pre-Anesthesia Assessment:                        - Prior to the procedure, a History and Physical was                         performed, and patient medications and allergies were                         reviewed. The patient is competent. The risks and                         benefits of the procedure and the sedation options and                         risks were discussed with the patient. All questions                         were answered and informed consent was obtained.                         Patient identification and proposed procedure were                         verified by the physician, the nurse, the                         anesthesiologist, the anesthetist and the technician                         in the endoscopy suite. Mental Status Examination:                         alert and oriented. Airway Examination: normal                         oropharyngeal airway and neck mobility. Respiratory  Examination: clear to auscultation. CV Examination:                         normal. Prophylactic Antibiotics: The patient does not                         require prophylactic antibiotics. Prior                          Anticoagulants: The patient has taken no anticoagulant                         or antiplatelet agents. ASA Grade Assessment: III - A                         patient with severe systemic disease. After reviewing                         the risks and benefits, the patient was deemed in                         satisfactory condition to undergo the procedure. The                         anesthesia plan was to use monitored anesthesia care                         (MAC). Immediately prior to administration of                         medications, the patient was re-assessed for adequacy                         to receive sedatives. The heart rate, respiratory                         rate, oxygen saturations, blood pressure, adequacy of                         pulmonary ventilation, and response to care were                         monitored throughout the procedure. The physical                         status of the patient was re-assessed after the                         procedure.                        After obtaining informed consent, the colonoscope was                         passed under direct vision. Throughout the procedure,                         the patient's blood pressure, pulse, and oxygen  saturations were monitored continuously. The                         Colonoscope was introduced through the anus and                         advanced to the the cecum, identified by appendiceal                         orifice and ileocecal valve. The colonoscopy was                         technically difficult and complex due to a redundant                         colon, significant looping and the patient's oxygen                         desaturation. Successful completion of the procedure                         was aided by applying abdominal pressure and managing                         the patient's medical instability. The patient                          tolerated the procedure well. The quality of the bowel                         preparation was adequate to identify polyps. The                         ileocecal valve, appendiceal orifice, and rectum were                         photographed. Findings:      The perianal and digital rectal examinations were normal.      A 7 mm polyp was found in the cecum. The polyp was sessile. The polyp       was removed with a piecemeal technique using a cold snare. Resection and       retrieval were complete. Estimated blood loss was minimal.      There was a medium-sized lipoma, at the hepatic flexure.      An 8 mm polyp was found in the proximal transverse colon. The polyp was       sessile. The polyp was removed with a hot snare. Resection and retrieval       were complete. Estimated blood loss was minimal.      An 11 mm polyp was found in the transverse colon. The polyp was flat.       Polypectomy was attempted, initially using a cold snare. Polyp resection       was incomplete with this device. This intervention then required a       different device and polypectomy technique. The polyp was removed with a       cold snare. Polyp resection was incomplete, and the resected tissue was       partially retrieved. The polyp  was removed with a hot snare. Resection       and retrieval were complete. To prevent bleeding post-intervention,       three hemostatic clips were successfully placed. There was no bleeding       during, or at the end, of the procedure. Area was tattooed with an       injection of dye.      A few small-mouthed diverticula were found in the sigmoid colon.      Internal hemorrhoids were found during retroflexion. The hemorrhoids       were Grade I (internal hemorrhoids that do not prolapse).      The exam was otherwise without abnormality on direct and retroflexion       views. Impression:            - One 7 mm polyp in the cecum, removed piecemeal using                          a cold snare. Resected and retrieved.                        - Medium-sized lipoma at the hepatic flexure.                        - One 8 mm polyp in the proximal transverse colon,                         removed with a hot snare. Resected and retrieved.                        - One 11 mm polyp in the transverse colon, removed                         with a cold snare and removed with a hot snare. Polyp                         resection was incomplete, and the resected tissue was                         partially retrieved. Clips were placed. Tattooed.                        - Diverticulosis in the sigmoid colon.                        - Internal hemorrhoids.                        - The examination was otherwise normal on direct and                         retroflexion views. Recommendation:        - Discharge patient to home.                        - Resume previous diet.                        - Continue present medications.                        -  Await pathology results.                        - Repeat colonoscopy in 1 year for surveillance after                         piecemeal polypectomy.                        - Return to referring physician as previously                         scheduled. Procedure Code(s):     --- Professional ---                        [phone], Colonoscopy, flexible; with removal of                         tumor(s), polyp(s), or other lesion(s) by snare                         technique                        [id number], Colonoscopy, flexible; with directed submucosal                         injection(s), any substance Diagnosis Code(s):     --- Professional ---                        D12.0, Benign neoplasm of cecum                        D12.3, Benign neoplasm of transverse colon (hepatic                         flexure or splenic flexure)                        D17.5, Benign lipomatous neoplasm of intra-abdominal                         organs                         K64.0, First degree hemorrhoids                        Z09, Encounter for follow-up examination after                         completed treatment for conditions other than                         malignant neoplasm                        Z86.010, Personal history of colonic polyps                        K57.30, Diverticulosis of large intestine without  perforation or abscess without bleeding CPT copyright 2022 American Medical Association. All rights reserved. The codes documented in this report are preliminary and upon coder review may  be revised to meet current compliance requirements. Andrey Farmer MD, MD 01/21/2023 1:48:48 PM Number of Addenda: 0 Note Initiated On: 01/21/2023 12:17 PM Scope Withdrawal Time: 0 hours 30 minutes 30 seconds  Total Procedure Duration: 0 hours 35 minutes 59 seconds  Estimated Blood Loss:  Estimated blood loss was minimal.      [facility]

## 2023-01-21 NOTE — Anesthesia Preprocedure Evaluation (Signed)
Anesthesia Evaluation  Patient identified by MRN, date of birth, ID band Patient awake    Reviewed: Allergy & Precautions, NPO status , Patient's Chart, lab work & pertinent test results  History of Anesthesia Complications Negative for: history of anesthetic complications  Airway Mallampati: III       Dental  (+) Upper Dentures, Lower Dentures   Pulmonary sleep apnea, Continuous Positive Airway Pressure Ventilation and Oxygen sleep apnea , COPD, Not current smoker and Patient did not abstain from smoking., former smoker  admitted 2/5-01/29/22 for respiratory failure requiring BiPAP, IV lasix and IV solu-medrol, felt to be more COPD exacerbation than HF exacerbation. She was discharged back home on O2.   Pulmonary exam normal        Cardiovascular Exercise Tolerance: Poor METShypertension, Pt. on medications +CHF  (-) CAD and (-) Past MI Normal cardiovascular exam(-) dysrhythmias   Euvolemic on exam today, no pedal edema. ECHO 2/23:  1. Left ventricular ejection fraction, by estimation, is 40 to 45%. The  left ventricle has mildly decreased function. The left ventricle  demonstrates global hypokinesis. The left ventricular internal cavity size  was mildly dilated. There is mild left  ventricular hypertrophy. Left ventricular diastolic parameters are  consistent with Grade II diastolic dysfunction (pseudonormalization).   2. Right ventricular systolic function is low normal. The right  ventricular size is mildly enlarged.   3. Left atrial size was mildly dilated.   4. Right atrial size was mildly dilated.   5. The mitral valve is normal in structure. No evidence of mitral valve  regurgitation. No evidence of mitral stenosis.   6. The aortic valve is normal in structure. Aortic valve regurgitation is  not visualized. No aortic stenosis is present.   7. The inferior vena cava is normal in size with greater than 50%  respiratory  variability, suggesting right atrial pressure of 3 mmHg.   Neuro/Psych  PSYCHIATRIC DISORDERS  Depression     Neuromuscular disease    GI/Hepatic Neg liver ROS,GERD  Medicated,,  Endo/Other  diabetes, Well Controlled, Type 2, Oral Hypoglycemic Agents, Insulin Dependent    Renal/GU Renal InsufficiencyRenal disease  negative genitourinary   Musculoskeletal  (+) Arthritis , Osteoarthritis,    Abdominal  (+) + obese  Peds negative pediatric ROS (+)  Hematology  (+) Blood dyscrasia, anemia   Anesthesia Other Findings Past Medical History: No date: Arthritis No date: CHF (congestive heart failure) (HCC) No date: COPD (chronic obstructive pulmonary disease) (HCC) No date: Diabetes mellitus without complication (HCC) No date: GERD (gastroesophageal reflux disease) No date: Hyperlipemia No date: Hypertension No date: Myocardial infarction [facility])     Comment:  light heart attack ?when No date: Neuropathy No date: Sleep apnea  Reproductive/Obstetrics                              Anesthesia Physical Anesthesia Plan  ASA: 3  Anesthesia Plan: General   Post-op Pain Management: Minimal or no pain anticipated   Induction: Intravenous  PONV Risk Score and Plan: 2 and Propofol infusion, TIVA and Ondansetron  Airway Management Planned: Nasal Cannula  Additional Equipment: None  Intra-op Plan:   Post-operative Plan:   Informed Consent: I have reviewed the patients History and Physical, chart, labs and discussed the procedure including the risks, benefits and alternatives for the proposed anesthesia with the patient or authorized representative who has indicated his/her understanding and acceptance.     Dental advisory given  Plan Discussed with: CRNA and Surgeon  Anesthesia Plan Comments: (Discussed risks of anesthesia with patient, including possibility of difficulty with spontaneous ventilation under anesthesia necessitating airway intervention,  PONV, and rare risks such as cardiac or respiratory or neurological events, and allergic reactions. Discussed the role of CRNA in patient's perioperative care. Patient understands.)         Anesthesia Quick Evaluation

## 2023-01-22 ENCOUNTER — Encounter: Payer: Self-pay | Admitting: Gastroenterology

## 2023-01-22 LAB — SURGICAL PATHOLOGY

## 2023-02-03 ENCOUNTER — Telehealth: Payer: Self-pay | Admitting: Family

## 2023-02-03 NOTE — Telephone Encounter (Signed)
Patient was approved for [employer] patient assistance for Entresto until 12/23/2023   Museum/gallery conservator, NT

## 2023-02-03 NOTE — Telephone Encounter (Signed)
Patients application continues to be pending for [employer] patient assistance.  I called and then refaxed application and sent application then off to med records. Patient status is still pending approval.   Kelsey Mcdowell, NT

## 2023-02-06 ENCOUNTER — Telehealth: Payer: Self-pay | Admitting: Family

## 2023-02-06 ENCOUNTER — Encounter: Payer: Medicare Other | Admitting: Family

## 2023-02-06 NOTE — Progress Notes (Deleted)
Patient ID: Kelsey Mcdowell, female    DOB: Mar 12, 1946, 77 y.o.   MRN: [id number]  HPI  Kelsey Mcdowell is a 77 yo with a PMHx of COPD, acute on chronic systolic CHF, DM II, tobacco abuse, obesity, and HTN.  Echo report from 01/28/22 reviewed and showed: EF 40-45% LV with mildly decreased function and global hypokinesis with mild LVH.   Has not been admitted or been in the ED in the last 6 months.   She presents today for a follow-up visit with a chief complaint of   Past Medical History:  Diagnosis Date   Arthritis    CHF (congestive heart failure) ([place])    COPD (chronic obstructive pulmonary disease) ([place])    Diabetes mellitus without complication ([place])    GERD (gastroesophageal reflux disease)    Hyperlipemia    Hypertension    Myocardial infarction ([place])    light heart attack ?when   Neuropathy    Sleep apnea    Past Surgical History:  Procedure Laterality Date   ABDOMINAL HYSTERECTOMY     APPENDECTOMY     BREAST EXCISIONAL BIOPSY Left 1970's   benign   CHOLECYSTECTOMY     COLONOSCOPY WITH PROPOFOL N/A 09/30/2018   Procedure: COLONOSCOPY WITH PROPOFOL;  Surgeon: Toledo, Benay Pike, MD;  Location: [facility] ENDOSCOPY;  Service: Gastroenterology;  Laterality: N/A;   COLONOSCOPY WITH PROPOFOL N/A 07/09/2022   Procedure: COLONOSCOPY WITH PROPOFOL;  Surgeon: [name], MD;  Location: [facility] ENDOSCOPY;  Service: Endoscopy;  Laterality: N/A;  IDDM   COLONOSCOPY WITH PROPOFOL N/A 01/21/2023   Procedure: COLONOSCOPY WITH PROPOFOL;  Surgeon: [name], MD;  Location: [facility] ENDOSCOPY;  Service: Endoscopy;  Laterality: N/A;   ESOPHAGOGASTRODUODENOSCOPY (EGD) WITH PROPOFOL N/A 07/09/2022   Procedure: ESOPHAGOGASTRODUODENOSCOPY (EGD) WITH PROPOFOL;  Surgeon: [name], MD;  Location: [facility] ENDOSCOPY;  Service: Endoscopy;  Laterality: N/A;   SPINE SURGERY  1998   cervical fusion   Family History  Problem Relation Age of Onset   Cancer Mother     Hypertension Mother    Diabetes Mother    Heart disease Father    Diabetes Father    Cancer Sister    Breast cancer Sister 30   Heart disease Brother    Social History   Tobacco Use   Smoking status: Former    Types: Cigarettes    Quit date: 12/31/2022    Years since quitting: 0.1   Smokeless tobacco: Never  Substance Use Topics   Alcohol use: Yes    Comment: occasional   Allergies  Allergen Reactions   Amoxicillin Other (See Comments)    Other Reaction: GI upset, cramps    Review of Systems  Constitutional:  Positive for fatigue (easily). Negative for appetite change.  HENT:  Negative for congestion, postnasal drip and sore throat.   Eyes:  Positive for visual disturbance (blurry vision).  Respiratory:  Positive for shortness of breath (easily). Negative for cough, chest tightness and wheezing.   Cardiovascular:  Positive for palpitations (at times) and leg swelling (left leg). Negative for chest pain.  Gastrointestinal:  Negative for abdominal distention and abdominal pain.  Endocrine: Negative.   Genitourinary: Negative.   Musculoskeletal:  Negative for back pain and neck pain.  Skin: Negative.   Allergic/Immunologic: Negative.   Neurological:  Negative for dizziness and light-headedness.  Hematological:  Negative for adenopathy. Does not bruise/bleed easily.  Psychiatric/Behavioral:  Negative for dysphoric mood and sleep disturbance (sleeping on 1 pillow). The patient is  not nervous/anxious.      Physical Exam Vitals and nursing note reviewed. Exam conducted with a chaperone present (niece).  Constitutional:      Appearance: Normal appearance.  HENT:     Head: Normocephalic and atraumatic.  Cardiovascular:     Rate and Rhythm: Normal rate and regular rhythm.  Pulmonary:     Effort: Pulmonary effort is normal. No respiratory distress.     Breath sounds: No wheezing or rales.  Abdominal:     General: There is no distension.     Palpations: Abdomen is soft.   Musculoskeletal:        General: No tenderness.     Cervical back: Normal range of motion and neck supple.     Right lower leg: No tenderness. No edema.     Left lower leg: No tenderness. Edema (trace pitting) present.  Neurological:     General: No focal deficit present.     Mental Status: She is alert and oriented to person, place, and time.  Psychiatric:        Mood and Affect: Mood normal.        Behavior: Behavior normal.    Assessment and Plan:  Chronic heart failure with mildly reduced ejection fraction- - NYHA class III - euvolemic - weighing most days but admits to not weighing daily; reviewed the importance of weighing daily and to call for an overnight weight gain of > 2 pounds or a weekly weight gain of > 5 pounds - weight 198.2 pounds from last visit here 1 month ago - reviewed keeping daily fluid intake to 60-64 ounces - on GDMT of entresto & spiro - BMP today - saw cardiology Margarito Courser) 12/26/22 - BNP 01/27/22 721.9 - PharmD reconciled medications with the patient  Hypertension- - BP  - saw PCP Clemmie Krill)  - BMP 01/16/23 reviewed and showed sodium 137, potassium 4.0, creatinine 1.69 and GFR 31  COPD- - saw pulmonologist Raul Del) 12/11/22 - no smoking for ~ 3 weeks and is no longer using nicotine patches - continued cessation discussed for 3 minutes - on O2 @ 2L 24/7 although came without it today  DM- - glucose at home this morning was  - supposed to be getting glucose monitoring device - A1c 07/03/22 was 8.9% (per PCP note)   Medication list reviewed

## 2023-02-06 NOTE — Telephone Encounter (Signed)
Patient did not show for her [facility] appointment on 02/06/23. Will attempt to reschedule.

## 2023-02-19 ENCOUNTER — Telehealth: Payer: Self-pay | Admitting: Family

## 2023-02-19 NOTE — Telephone Encounter (Signed)
Patient niece [facility]) called stating that pt is approved for the [facility] however, patient needs a new refill. Ms. Earlie Counts states that patient is currently out of medication. She receives via patient assistance program for [employer]. Please advise niece Ms. Lassiter (POA).

## 2023-02-19 NOTE — Telephone Encounter (Signed)
   Spoke with patient niece Charity fundraiser ) via phone regarding Entresto refill.  Samples will be picked up by niece. Tina,NP aware.  Medication Samples have been provided to the patient.  Drug name: Delene Loll       Strength: 24-26 MG        Qty: 2  LOT: [id number]  Exp.Date: Jul/2025  Dosing instructions: 1 tablet by mouth 2 times daily.  The patient has been instructed regarding the correct time, dose, and frequency of taking this medication, including desired effects and most common side effects.   Julianne Handler 2:23 PM 02/19/2023

## 2023-02-27 ENCOUNTER — Encounter: Payer: Self-pay | Admitting: Family

## 2023-02-27 ENCOUNTER — Ambulatory Visit: Payer: Medicare Other | Attending: Family | Admitting: Family

## 2023-02-27 VITALS — BP 151/68 | HR 84 | Wt 196.4 lb

## 2023-02-27 DIAGNOSIS — I110 Hypertensive heart disease with heart failure: Secondary | ICD-10-CM | POA: Insufficient documentation

## 2023-02-27 DIAGNOSIS — R0602 Shortness of breath: Secondary | ICD-10-CM | POA: Insufficient documentation

## 2023-02-27 DIAGNOSIS — I5022 Chronic systolic (congestive) heart failure: Secondary | ICD-10-CM | POA: Insufficient documentation

## 2023-02-27 DIAGNOSIS — R002 Palpitations: Secondary | ICD-10-CM | POA: Diagnosis not present

## 2023-02-27 DIAGNOSIS — E1122 Type 2 diabetes mellitus with diabetic chronic kidney disease: Secondary | ICD-10-CM | POA: Diagnosis not present

## 2023-02-27 DIAGNOSIS — I10 Essential (primary) hypertension: Secondary | ICD-10-CM | POA: Diagnosis not present

## 2023-02-27 DIAGNOSIS — E785 Hyperlipidemia, unspecified: Secondary | ICD-10-CM | POA: Diagnosis not present

## 2023-02-27 DIAGNOSIS — Z794 Long term (current) use of insulin: Secondary | ICD-10-CM | POA: Diagnosis not present

## 2023-02-27 DIAGNOSIS — Z87891 Personal history of nicotine dependence: Secondary | ICD-10-CM | POA: Diagnosis not present

## 2023-02-27 DIAGNOSIS — I252 Old myocardial infarction: Secondary | ICD-10-CM | POA: Insufficient documentation

## 2023-02-27 DIAGNOSIS — Z7984 Long term (current) use of oral hypoglycemic drugs: Secondary | ICD-10-CM | POA: Insufficient documentation

## 2023-02-27 DIAGNOSIS — E1140 Type 2 diabetes mellitus with diabetic neuropathy, unspecified: Secondary | ICD-10-CM | POA: Insufficient documentation

## 2023-02-27 DIAGNOSIS — R600 Localized edema: Secondary | ICD-10-CM | POA: Insufficient documentation

## 2023-02-27 DIAGNOSIS — Z79899 Other long term (current) drug therapy: Secondary | ICD-10-CM | POA: Insufficient documentation

## 2023-02-27 DIAGNOSIS — J449 Chronic obstructive pulmonary disease, unspecified: Secondary | ICD-10-CM | POA: Diagnosis not present

## 2023-02-27 DIAGNOSIS — N1831 Chronic kidney disease, stage 3a: Secondary | ICD-10-CM

## 2023-02-27 NOTE — Progress Notes (Signed)
Patient ID: Kelsey Mcdowell, female    DOB: 23-Aug-1946, 77 y.o.   MRN: [id number]  HPI  Kelsey Mcdowell is a 77 yo with a PMHx of COPD, acute on chronic systolic CHF, DM II, tobacco abuse, obesity, and HTN.  Echo report from 01/28/22 reviewed and showed: EF 40-45% LV with mildly decreased function and global hypokinesis with mild LVH.   Has not been admitted or been in the ED in the last 6 months.   She presents today for a follow-up visit with a chief complaint of moderate SOB with minimal exertion. Describes this as chronic in nature. Has associated fatigue, pedal edema and palpitations along with this. Denies any difficulty sleeping, abdominal distention, chest pain, wheezing, cough, dizziness or weight gain.   Has been out of entresto for ~ 2 weeks before getting it resumed 5 days ago.   Past Medical History:  Diagnosis Date   Arthritis    CHF (congestive heart failure) (HCC)    COPD (chronic obstructive pulmonary disease) (HCC)    Diabetes mellitus without complication (HCC)    GERD (gastroesophageal reflux disease)    Hyperlipemia    Hypertension    Myocardial infarction ([place])    light heart attack ?when   Neuropathy    Sleep apnea    Past Surgical History:  Procedure Laterality Date   ABDOMINAL HYSTERECTOMY     APPENDECTOMY     BREAST EXCISIONAL BIOPSY Left 1970's   benign   CHOLECYSTECTOMY     COLONOSCOPY WITH PROPOFOL N/A 09/30/2018   Procedure: COLONOSCOPY WITH PROPOFOL;  Surgeon: Toledo, Benay Pike, MD;  Location: [facility]COPY;  Service: Gastroenterology;  Laterality: N/A;   COLONOSCOPY WITH PROPOFOL N/A 07/09/2022   Procedure: COLONOSCOPY WITH PROPOFOL;  Surgeon: [name], MD;  Location: [facility];  Service: Endoscopy;  Laterality: N/A;  IDDM   COLONOSCOPY WITH PROPOFOL N/A 01/21/2023   Procedure: COLONOSCOPY WITH PROPOFOL;  Surgeon: [name], MD;  Location: [facility];  Service: Endoscopy;  Laterality: N/A;    ESOPHAGOGASTRODUODENOSCOPY (EGD) WITH PROPOFOL N/A 07/09/2022   Procedure: ESOPHAGOGASTRODUODENOSCOPY (EGD) WITH PROPOFOL;  Surgeon: [name], MD;  Location: [facility];  Service: Endoscopy;  Laterality: N/A;   SPINE SURGERY  1998   cervical fusion   Family History  Problem Relation Age of Onset   Cancer Mother    Hypertension Mother    Diabetes Mother    Heart disease Father    Diabetes Father    Cancer Sister    Breast cancer Sister 27   Heart disease Brother    Social History   Tobacco Use   Smoking status: Former    Types: Cigarettes    Quit date: 12/31/2022    Years since quitting: 0.1   Smokeless tobacco: Never  Substance Use Topics   Alcohol use: Yes    Comment: occasional   Allergies  Allergen Reactions   Amoxicillin Other (See Comments)    Other Reaction: GI upset, cramps   Prior to Admission medications   Medication Sig Start Date End Date Taking? Authorizing Provider  acetaminophen (TYLENOL) 500 MG tablet Take 500 mg by mouth every 6 (six) hours as needed (back pain).   Yes [provider]  ALPRAZolam (XANAX) 0.25 MG tablet Take 0.25 mg by mouth daily at 6 (six) AM.   Yes [provider]  ARIPiprazole (ABILIFY) 2 MG tablet Take 2 mg by mouth daily. 11/17/21  Yes [provider]  dapagliflozin propanediol (FARXIGA) 10 MG TABS tablet  Take 1 tablet (10 mg total) by mouth daily before breakfast. 09/05/22  Yes Darylene Price A, FNP  furosemide (LASIX) 20 MG tablet Take 1 tablet (20 mg total) by mouth daily. Patient taking differently: Take 40 mg by mouth daily. 01/29/22  Yes Wieting, Richard, MD  gabapentin (NEURONTIN) 300 MG capsule Take 600-900 mg by mouth 4 (four) times daily. Take 2 capsules ('600mg'$ ) by mouth every morning and take 3 capsules ('900mg'$ ) by mouth every night   Yes [provider]  insulin aspart (NOVOLOG) 100 UNIT/ML injection Inject 7 Units into the skin 3 (three) times daily with meals.   Yes [provider]  insulin glargine (LANTUS) 100 UNIT/ML Solostar Pen Inject 30 Units into the skin daily. Patient taking differently: Inject 20-30 Units into the skin daily. 01/29/22  Yes Wieting, Richard, MD  metFORMIN (GLUCOPHAGE) 500 MG tablet Take 1,000 mg by mouth 2 (two) times daily with a meal.   Yes [provider]  metoprolol succinate (TOPROL-XL) 25 MG 24 hr tablet Take 25 mg by mouth daily.   Yes [provider]  Multiple Vitamin (MULTIVITAMIN WITH MINERALS) TABS tablet Take 1 tablet by mouth daily.   Yes [provider]  nortriptyline (PAMELOR) 25 MG capsule Take 75 mg by mouth at bedtime.   Yes [provider]  omeprazole (PRILOSEC) 20 MG capsule Take 20 mg by mouth daily. 11/17/21  Yes [provider]  sacubitril-valsartan (ENTRESTO) 24-26 MG Take 1 tablet by mouth 2 (two) times daily. 12/04/22  Yes Jaylyn Booher, Otila Kluver A, FNP  sertraline (ZOLOFT) 50 MG tablet Take 50 mg by mouth daily. 11/17/21  Yes [provider]  spironolactone (ALDACTONE) 25 MG tablet Take 0.5 tablets (12.5 mg total) by mouth daily. 01/08/23  Yes Haskell Rihn, Otila Kluver A, FNP  vitamin B-12 (CYANOCOBALAMIN) 1000 MCG tablet Take 1,000 mcg by mouth daily.   Yes [provider]  albuterol (VENTOLIN HFA) 108 (90 Base) MCG/ACT inhaler Inhale 2 puffs into the lungs every 6 (six) hours as needed for wheezing or shortness of breath. Patient not taking: Reported on 01/08/2023 01/29/22   Loletha Grayer, MD  Insulin Pen Needle 34G X 3.5 MM MISC 1 Dose by Does not apply route daily. 01/29/22   Wieting, Richard, MD  Secukinumab (COSENTYX [place]) Inject into the skin every 30 (thirty) days. Last date give was the week of 11/25/22. Pt unsure of exact date.    [provider]   Review of Systems  Constitutional:  Positive for fatigue (easily). Negative for appetite change.  HENT:  Negative for congestion, postnasal drip and sore throat.   Eyes:  Positive for visual disturbance (blurry  vision).  Respiratory:  Positive for shortness of breath (easily). Negative for cough, chest tightness and wheezing.   Cardiovascular:  Positive for palpitations (at times) and leg swelling (left leg). Negative for chest pain.  Gastrointestinal:  Negative for abdominal distention and abdominal pain.  Endocrine: Negative.   Genitourinary: Negative.   Musculoskeletal:  Negative for back pain and neck pain.  Skin: Negative.   Allergic/Immunologic: Negative.   Neurological:  Negative for dizziness and light-headedness.  Hematological:  Negative for adenopathy. Does not bruise/bleed easily.  Psychiatric/Behavioral:  Negative for dysphoric mood and sleep disturbance (sleeping on 1 pillow). The patient is not nervous/anxious.    Vitals:   02/27/23 1559  BP: (!) 151/68  Pulse: 84  SpO2: 94%  Weight: 196 lb 6.4 oz (89.1 kg)   Wt Readings from Last 3 Encounters:  02/27/23 196  lb 6.4 oz (89.1 kg)  01/21/23 186 lb (84.4 kg)  01/08/23 198 lb 2 oz (89.9 kg)   Lab Results  Component Value Date   CREATININE 1.69 (H) 01/16/2023   CREATININE 1.47 (H) 12/04/2022   CREATININE 1.56 (H) 09/11/2022   Physical Exam Vitals and nursing note reviewed. Exam conducted with a chaperone present (niece).  Constitutional:      Appearance: Normal appearance.  HENT:     Head: Normocephalic and atraumatic.  Cardiovascular:     Rate and Rhythm: Normal rate and regular rhythm.  Pulmonary:     Effort: Pulmonary effort is normal. No respiratory distress.     Breath sounds: No wheezing or rales.  Abdominal:     General: There is no distension.     Palpations: Abdomen is soft.  Musculoskeletal:        General: No tenderness.     Cervical back: Normal range of motion and neck supple.     Right lower leg: No tenderness. No edema.     Left lower leg: No tenderness. Edema (trace pitting) present.  Neurological:     General: No focal deficit present.     Mental Status: She is alert and oriented to person,  place, and time.  Psychiatric:        Mood and Affect: Mood normal.        Behavior: Behavior normal.    Assessment and Plan:  Chronic heart failure with mildly reduced ejection fraction- - NYHA class III - euvolemic - weighing most days but admits to not weighing daily; reviewed the importance of weighing daily and to call for an overnight weight gain of > 2 pounds or a weekly weight gain of > 5 pounds - weight down 2 pounds from last visit here 2.5 months ago - reviewed keeping daily fluid intake to 60-64 ounces - echo 01/28/22: EF 40-45% LV with mildly decreased function and global hypokinesis with mild LVH; discuss getting this updated - entresto 24/'26mg'$  BID although just resumed 5 days ago; had been out of it for ~ 2 weeks - farxiga '10mg'$  daily - furosemide '40mg'$  daily - spironolactone 12.'5mg'$  daily - metoprolol succinate '25mg'$  daily - saw cardiology Margarito Courser) 12/26/22 - consider adding ozempic; will look into this option and get back to her - BNP 01/27/22 721.9  Hypertension- - BP 151/68 (just resumed entresto 5 days ago) - saw PCP Clemmie Krill)  - BMP 01/16/23 reviewed and showed sodium 137, potassium 4.0, creatinine 1.69 and GFR 31  COPD- - saw pulmonologist Raul Del) 12/11/22 - has resumed smoking 1 pack every 2-3 days; recent deaths in the family have increased her stress level - continued cessation discussed for 3 minutes - on O2 @ 2L 24/7 although came without it today  DM- - supposed to be getting glucose monitoring device but she doesn't think she can afford it; niece is going to follow-up on this - A1c 07/03/22 was 8.9% (per PCP note)   Medication list reviewed  Return in 1 month, sooner if needed

## 2023-02-28 ENCOUNTER — Encounter: Payer: Self-pay | Admitting: Family

## 2023-02-28 ENCOUNTER — Telehealth: Payer: Self-pay

## 2023-02-28 MED ORDER — SEMAGLUTIDE(0.25 OR 0.5MG/DOS) 2 MG/3ML ~~LOC~~ SOPN: 0.2500 mg | PEN_INJECTOR | SUBCUTANEOUS | 3 refills | Status: DC

## 2023-02-28 NOTE — Telephone Encounter (Addendum)
Spoke with pt and pt's niece Acupuncturist via phone. Pt and niece aware, agreeable, and verbalized understanding of plan. Rx added.   ----- Message from Alisa Graff, FNP sent at 02/28/2023  3:52 PM EST ----- Regarding: ozempic Please call niece Kelsey Mcdowell to tell her that we can start ozempic 0.'25mg'$  weekly until next appointment. Please send prescription to her pharmacy.

## 2023-03-04 ENCOUNTER — Telehealth: Payer: Self-pay | Admitting: Family

## 2023-03-04 NOTE — Telephone Encounter (Signed)
Incoming fax stating that [employer] has shipped via [employer] [id number] on 3.12.24 with tracking [id number] to pt's address on file.

## 2023-03-06 ENCOUNTER — Other Ambulatory Visit: Payer: Self-pay | Admitting: Family

## 2023-03-06 MED ORDER — DAPAGLIFLOZIN PROPANEDIOL 10 MG PO TABS: 10.0000 mg | ORAL_TABLET | Freq: Every day | ORAL | 3 refills | Status: DC

## 2023-03-14 ENCOUNTER — Telehealth: Payer: Self-pay | Admitting: Family

## 2023-03-14 MED ORDER — SACUBITRIL-VALSARTAN 24-26 MG PO TABS: 1.0000 | ORAL_TABLET | Freq: Two times a day (BID) | ORAL | 3 refills | Status: DC

## 2023-03-14 NOTE — Telephone Encounter (Signed)
Entresto 24-26 MG medication refill done. Sent to pharmacy requested by pt.  [place], [place].

## 2023-03-14 NOTE — Telephone Encounter (Signed)
Patient called in stating that pharmacy advised her she has no more refills left for her medication needing a new Rx sent in for medication    Please call and advise when done and taken care of    sacubitril-valsartan (ENTRESTO) 24-26 [place], [place]

## 2023-04-02 ENCOUNTER — Ambulatory Visit: Payer: Medicare Other | Attending: Family | Admitting: Family

## 2023-04-02 ENCOUNTER — Encounter: Payer: Self-pay | Admitting: Family

## 2023-04-02 VITALS — BP 137/66 | HR 86 | Resp 14 | Wt 199.0 lb

## 2023-04-02 DIAGNOSIS — Z9981 Dependence on supplemental oxygen: Secondary | ICD-10-CM | POA: Insufficient documentation

## 2023-04-02 DIAGNOSIS — N1831 Chronic kidney disease, stage 3a: Secondary | ICD-10-CM | POA: Diagnosis not present

## 2023-04-02 DIAGNOSIS — E1122 Type 2 diabetes mellitus with diabetic chronic kidney disease: Secondary | ICD-10-CM | POA: Insufficient documentation

## 2023-04-02 DIAGNOSIS — Z794 Long term (current) use of insulin: Secondary | ICD-10-CM | POA: Diagnosis not present

## 2023-04-02 DIAGNOSIS — R002 Palpitations: Secondary | ICD-10-CM | POA: Diagnosis not present

## 2023-04-02 DIAGNOSIS — M25521 Pain in right elbow: Secondary | ICD-10-CM | POA: Diagnosis not present

## 2023-04-02 DIAGNOSIS — E1140 Type 2 diabetes mellitus with diabetic neuropathy, unspecified: Secondary | ICD-10-CM | POA: Diagnosis not present

## 2023-04-02 DIAGNOSIS — E669 Obesity, unspecified: Secondary | ICD-10-CM | POA: Insufficient documentation

## 2023-04-02 DIAGNOSIS — Y939 Activity, unspecified: Secondary | ICD-10-CM | POA: Diagnosis not present

## 2023-04-02 DIAGNOSIS — I110 Hypertensive heart disease with heart failure: Secondary | ICD-10-CM | POA: Insufficient documentation

## 2023-04-02 DIAGNOSIS — Z7984 Long term (current) use of oral hypoglycemic drugs: Secondary | ICD-10-CM | POA: Insufficient documentation

## 2023-04-02 DIAGNOSIS — Z79899 Other long term (current) drug therapy: Secondary | ICD-10-CM | POA: Insufficient documentation

## 2023-04-02 DIAGNOSIS — J449 Chronic obstructive pulmonary disease, unspecified: Secondary | ICD-10-CM | POA: Diagnosis not present

## 2023-04-02 DIAGNOSIS — I5022 Chronic systolic (congestive) heart failure: Secondary | ICD-10-CM | POA: Insufficient documentation

## 2023-04-02 DIAGNOSIS — R0602 Shortness of breath: Secondary | ICD-10-CM | POA: Diagnosis present

## 2023-04-02 DIAGNOSIS — F17210 Nicotine dependence, cigarettes, uncomplicated: Secondary | ICD-10-CM | POA: Diagnosis not present

## 2023-04-02 DIAGNOSIS — S50811A Abrasion of right forearm, initial encounter: Secondary | ICD-10-CM | POA: Insufficient documentation

## 2023-04-02 DIAGNOSIS — R531 Weakness: Secondary | ICD-10-CM | POA: Diagnosis not present

## 2023-04-02 DIAGNOSIS — Y929 Unspecified place or not applicable: Secondary | ICD-10-CM | POA: Diagnosis not present

## 2023-04-02 DIAGNOSIS — I10 Essential (primary) hypertension: Secondary | ICD-10-CM

## 2023-04-02 DIAGNOSIS — T148XXA Other injury of unspecified body region, initial encounter: Secondary | ICD-10-CM

## 2023-04-02 MED ORDER — SEMAGLUTIDE(0.25 OR 0.5MG/DOS) 2 MG/3ML ~~LOC~~ SOPN: 0.5000 mg | PEN_INJECTOR | SUBCUTANEOUS | 3 refills | Status: DC

## 2023-04-02 NOTE — Progress Notes (Signed)
Patient ID: Kelsey Mcdowell, female    DOB: 1946/08/23, 77 y.o.   MRN: [id number]  Primary cardiologist: Leanora Ivanoffrane, Anna PA (last seen 01/24) PCP: Dortha KernBliss, Laura K, MD (last seen ~ 02/24)  HPI  Kelsey Mcdowell is a 77 yo with a PMHx of COPD, acute on chronic systolic CHF, DM II, tobacco abuse, obesity, and HTN.  Echo 01/28/22: EF 40-45% LV with mildly decreased function and global hypokinesis with mild LVH.   Has not been admitted or been in the ED in the last 6 months.   She presents today for a HF follow-up visit with a chief complaint of moderate fatigue with little exertion. Chronic in nature. Has associated SOB, occasional palpitations, leg weakness, depression and gradual weight gain along with this. Denies dizziness, abdominal distention, pedal edema, chest pain, wheezing or cough.   She voices frustration with her weight as it continues to gradually rise even with starting ozempic. She admits that she's not very active because of her leg weakness and she also says that she eats "all day long" and whatever she can find. She feels like her depression contributes to her eating habits as well.   Has been out of entresto recently because she didn't realize that she had to call [employer] patient assistance to get the medication shipped to her.   She has an abrasion on her right forearm that she would like me to look at. She says that 3 days ago, she fell into a door at a friend's house and she wants to make sure it isn't infected. Has been keeping it wrapped. Wrist is sore.   Past Medical History:  Diagnosis Date   Arthritis    CHF (congestive heart failure) (HCC)    COPD (chronic obstructive pulmonary disease) (HCC)    Diabetes mellitus without complication (HCC)    GERD (gastroesophageal reflux disease)    Hyperlipemia    Hypertension    Myocardial infarction (HCC)    light heart attack ?when   Neuropathy    Sleep apnea    Past Surgical History:  Procedure Laterality Date    ABDOMINAL HYSTERECTOMY     APPENDECTOMY     BREAST EXCISIONAL BIOPSY Left 1970's   benign   CHOLECYSTECTOMY     COLONOSCOPY WITH PROPOFOL N/A 09/30/2018   Procedure: COLONOSCOPY WITH PROPOFOL;  Surgeon: Toledo, Boykin Nearingeodoro K, MD;  Location: [facility];  Service: Gastroenterology;  Laterality: N/A;   COLONOSCOPY WITH PROPOFOL N/A 07/09/2022   Procedure: COLONOSCOPY WITH PROPOFOL;  Surgeon: Regis BillLocklear, Cameron T, MD;  Location: [facility];  Service: Endoscopy;  Laterality: N/A;  IDDM   COLONOSCOPY WITH PROPOFOL N/A 01/21/2023   Procedure: COLONOSCOPY WITH PROPOFOL;  Surgeon: Regis BillLocklear, Cameron T, MD;  Location: [facility];  Service: Endoscopy;  Laterality: N/A;   ESOPHAGOGASTRODUODENOSCOPY (EGD) WITH PROPOFOL N/A 07/09/2022   Procedure: ESOPHAGOGASTRODUODENOSCOPY (EGD) WITH PROPOFOL;  Surgeon: Regis BillLocklear, Cameron T, MD;  Location: [facility];  Service: Endoscopy;  Laterality: N/A;   SPINE SURGERY  1998   cervical fusion   Family History  Problem Relation Age of Onset   Cancer Mother    Hypertension Mother    Diabetes Mother    Heart disease Father    Diabetes Father    Cancer Sister    Breast cancer Sister [id number]   Heart disease Brother    Social History   Tobacco Use   Smoking status: Former    Types: Cigarettes    Quit date: 12/31/2022    Years since quitting: 0.2  Smokeless tobacco: Never  Substance Use Topics   Alcohol use: Yes    Comment: occasional   Allergies  Allergen Reactions   Amoxicillin Other (See Comments)    Other Reaction: GI upset, cramps   Prior to Admission medications   Medication Sig Start Date End Date Taking? Authorizing Provider  acetaminophen (TYLENOL) 500 MG tablet Take 500 mg by mouth every 6 (six) hours as needed (back pain).   Yes [provider]  ALPRAZolam (XANAX) 0.25 MG tablet Take 0.25 mg by mouth daily at 6 (six) AM.   Yes [provider]  ARIPiprazole (ABILIFY) 2 MG tablet Take 2 mg by mouth daily. 11/17/21  Yes  [provider]  dapagliflozin propanediol (FARXIGA) 10 MG TABS tablet Take 1 tablet (10 mg total) by mouth daily before breakfast. 03/06/23  Yes [name] A, FNP  furosemide (LASIX) 20 MG tablet Take 1 tablet (20 mg total) by mouth daily. Patient taking differently: Take 40 mg by mouth daily. 01/29/22  Yes Wieting, Richard, MD  gabapentin (NEURONTIN) 300 MG capsule Take 600-900 mg by mouth 4 (four) times daily. Take 2 capsules (600mg ) by mouth every morning and take 3 capsules (900mg ) by mouth every night   Yes [provider]  insulin aspart (NOVOLOG) 100 UNIT/ML injection Inject 7 Units into the skin 3 (three) times daily with meals.   Yes [provider]  insulin glargine (LANTUS) 100 UNIT/ML Solostar Pen Inject 30 Units into the skin daily. Patient taking differently: Inject 20-30 Units into the skin daily. 01/29/22  Yes Wieting, Richard, MD  Insulin Pen Needle 34G X 3.5 MM MISC 1 Dose by Does not apply route daily. 01/29/22  Yes Wieting, Richard, MD  metFORMIN (GLUCOPHAGE) 500 MG tablet Take 1,000 mg by mouth 2 (two) times daily with a meal.   Yes [provider]  metoprolol succinate (TOPROL-XL) 25 MG 24 hr tablet Take 25 mg by mouth daily.   Yes [provider]  Multiple Vitamin (MULTIVITAMIN WITH MINERALS) TABS tablet Take 1 tablet by mouth daily.   Yes [provider]  nortriptyline (PAMELOR) 25 MG capsule Take 75 mg by mouth at bedtime.   Yes [provider]  omeprazole (PRILOSEC) 20 MG capsule Take 20 mg by mouth daily. 11/17/21  Yes [provider]  Secukinumab (COSENTYX [place]) Inject into the skin every 30 (thirty) days. Last date give was the week of 11/25/22. Pt unsure of exact date.   Yes [provider]  Semaglutide,0.25 or 0.5MG /DOS, 2 MG/3ML SOPN Inject 0.25 mg into the skin once a week. 02/28/23  Yes Tremon Sainvil, Inetta Fermo A, FNP  sertraline (ZOLOFT) 50 MG tablet Take 50 mg by mouth daily. 11/17/21  Yes [provider]  spironolactone (ALDACTONE) 25 MG tablet Take 0.5 tablets (12.5 mg total) by mouth daily. 01/08/23  Yes Layla Kesling, Inetta Fermo A, FNP  vitamin B-12 (CYANOCOBALAMIN) 1000 MCG tablet Take 1,000 mcg by mouth daily.   Yes [provider]  sacubitril-valsartan (ENTRESTO) 24-26 MG Take 1 tablet by mouth 2 (two) times daily. Patient not taking: Reported on 04/02/2023 03/14/23   Delma Freeze, FNP    Review of Systems  Constitutional:  Positive for fatigue (easily). Negative for appetite change.  HENT:  Negative for congestion, postnasal drip and sore throat.   Eyes:  Positive for visual disturbance (blurry vision).  Respiratory:  Positive for shortness of breath (improving). Negative for cough, chest tightness and wheezing.   Cardiovascular:  Positive for palpitations (at times). Negative  for chest pain and leg swelling.  Gastrointestinal:  Negative for abdominal distention and abdominal pain.  Endocrine: Negative.   Genitourinary: Negative.   Musculoskeletal:  Negative for back pain and neck pain.  Skin:        Abrasion on right forearm  Allergic/Immunologic: Negative.   Neurological:  Positive for weakness (legs). Negative for dizziness and light-headedness.  Hematological:  Negative for adenopathy. Does not bruise/bleed easily.  Psychiatric/Behavioral:  Negative for dysphoric mood and sleep disturbance (sleeping on 1 pillow). The patient is not nervous/anxious.    Vitals:   04/02/23 0933  BP: 137/66  Pulse: 86  Resp: 14  SpO2: 93%  Weight: 199 lb (90.3 kg)   Wt Readings from Last 3 Encounters:  04/02/23 199 lb (90.3 kg)  02/27/23 196 lb 6.4 oz (89.1 kg)  01/21/23 186 lb (84.4 kg)   Lab Results  Component Value Date   CREATININE 1.69 (H) 01/16/2023   CREATININE 1.47 (H) 12/04/2022   CREATININE 1.56 (H) 09/11/2022   Physical Exam Vitals and nursing note reviewed.  Constitutional:      Appearance: Normal appearance.  HENT:     Head: Normocephalic and atraumatic.   Cardiovascular:     Rate and Rhythm: Normal rate and regular rhythm.  Pulmonary:     Effort: Pulmonary effort is normal. No respiratory distress.     Breath sounds: No wheezing or rales.  Abdominal:     General: There is no distension.     Palpations: Abdomen is soft.  Musculoskeletal:        General: No tenderness.     Cervical back: Normal range of motion and neck supple.     Right lower leg: No tenderness. No edema.     Left lower leg: No tenderness. No edema.  Skin:    Findings: Abrasion (right outer forearm; no drainage noted, pink around the edges) and bruising (right forearm) present.  Neurological:     General: No focal deficit present.     Mental Status: She is alert and oriented to person, place, and time.  Psychiatric:        Mood and Affect: Mood normal.        Behavior: Behavior normal.    Assessment and Plan:  Chronic heart failure with mildly reduced ejection fraction- - NYHA class III - euvolemic - weighing most days but admits to not weighing daily; reviewed the importance of weighing daily and to call for an overnight weight gain of > 2 pounds or a weekly weight gain of > 5 pounds - weight up 3 pounds from last visit here 1 month ago - Echo 01/28/22: EF 40-45% LV with mildly decreased function and global hypokinesis with mild LVH.  - reviewed keeping daily fluid intake to 60-64 ounces - echo 01/28/22: EF 40-45% LV with mildly decreased function and global hypokinesis with mild LVH; discuss getting this updated - continue entresto 24/26mg  BID; samples provided today and reviewed how to call [employer] to get the medication shipped - continue farxiga 10mg  daily - continue furosemide 40mg  daily - continue spironolactone 12.5mg  daily - continue metoprolol succinate 25mg  daily - saw cardiology Mellissa Kohut) 01/24; returns 05/24 - encouraged as much activity as possible - BNP 01/27/22 721.9  Hypertension- - BP 137/66 - saw PCP (Bliss) ~ 2 months ago - BMP 01/16/23 reviewed  and showed sodium 137, potassium 4.0, creatinine 1.69 and GFR 31  COPD- - saw pulmonologist Meredeth Ide) 12/11/22 - smoking 1 pack every 2-3 days; does not smoke with  oxygen on - continued cessation discussed for 3 minutes - on O2 @ 2L 24/7 although came without it today  DM- - supposed to be getting glucose monitoring device but she doesn't think she can afford it; niece is going to follow-up on this - A1c 07/03/22 was 8.9% (per PCP note) - started ozempic 0.25mg  at last visit; increase this to 0.5mg  weekly - discussed [facility] referral but she would like to defer this until next time - discussed [facility] and she may be interested in that for support; flyer given to patient with the information on it and also placed the referral  5: Right forearm abrasion- - skin is torn back but no drainage noted - edges are pink  - advised her to continue to monitor for signs of infection (redness, drainage etc) and if this occurs, she needs to call her PCP - cover it when out and at bedtime, otherwise, leave it open to air   Return in 1 month, sooner if needed.

## 2023-04-02 NOTE — Progress Notes (Signed)
Medication Samples have been provided to the patient.  Drug name: [name]       Strength: 24/26 mg        Qty: 1  LOT: [id number]  Exp.Date: July 2025  Dosing instructions: Take one tablet by mouth two (2) times daily  The patient has been instructed regarding the correct time, dose, and frequency of taking this medication, including desired effects and most common side effects.   Simonne Maffucci 10:04 AM 04/02/2023

## 2023-04-02 NOTE — Patient Instructions (Addendum)
You have been given two weeks supply of Entresto sample medication today. Your application for patient assistance was approved for renewal in February 2024. You will need to call them. Please call Entresto Patient Assistance number today to have them send you a 90 day supply by mail.    I sent you the 0.5mg  ozempic dose in.

## 2023-04-03 ENCOUNTER — Telehealth: Payer: Self-pay

## 2023-04-03 DIAGNOSIS — Z0000 Encounter for general adult medical examination without abnormal findings: Secondary | ICD-10-CM

## 2023-04-03 NOTE — Telephone Encounter (Signed)
Called patient per health coaching referral from [facility] for smoking cessation and community wellness/exercise programs. Was unable to leave a message due to voicemail box not being set up.  [name], MS, ERHD, [facility]  Care Guide, Health & Wellness Coach [address] [place] [address] Telephone: [phone] Email: [email or web address]

## 2023-04-10 ENCOUNTER — Telehealth: Payer: Self-pay

## 2023-04-10 DIAGNOSIS — Z0000 Encounter for general adult medical examination without abnormal findings: Secondary | ICD-10-CM

## 2023-04-10 NOTE — Telephone Encounter (Signed)
Called patient to offer health coaching for smoking cessation and exercise/wellness plans per referral. Patient stated that she is not interested in participating in the health coaching program.   [name], MS, ERHD, [facility]  Care Guide, Health & Wellness Coach [address] [place] [address] Telephone: [phone] Email: [email or web address]

## 2023-04-21 ENCOUNTER — Other Ambulatory Visit: Payer: Self-pay | Admitting: Family

## 2023-04-24 LAB — HEMOGLOBIN A1C: Hemoglobin A1C: 7.9

## 2023-04-30 ENCOUNTER — Other Ambulatory Visit
Admission: RE | Admit: 2023-04-30 | Discharge: 2023-04-30 | Disposition: A | Discharge status: Home / Self Care | Payer: Medicare Other | Source: Ambulatory Visit | Attending: Physician Assistant | Admitting: Physician Assistant

## 2023-04-30 ENCOUNTER — Ambulatory Visit (INDEPENDENT_AMBULATORY_CARE_PROVIDER_SITE_OTHER): Payer: Medicare Other | Admitting: Family Medicine

## 2023-04-30 ENCOUNTER — Encounter: Payer: Self-pay | Admitting: Family Medicine

## 2023-04-30 VITALS — BP 131/72 | HR 76 | Temp 98.6°F | Ht 60.5 in | Wt 196.3 lb

## 2023-04-30 DIAGNOSIS — Z79899 Other long term (current) drug therapy: Secondary | ICD-10-CM | POA: Insufficient documentation

## 2023-04-30 DIAGNOSIS — I5023 Acute on chronic systolic (congestive) heart failure: Secondary | ICD-10-CM | POA: Diagnosis not present

## 2023-04-30 DIAGNOSIS — N179 Acute kidney failure, unspecified: Secondary | ICD-10-CM | POA: Diagnosis not present

## 2023-04-30 DIAGNOSIS — Z794 Long term (current) use of insulin: Secondary | ICD-10-CM

## 2023-04-30 DIAGNOSIS — E119 Type 2 diabetes mellitus without complications: Secondary | ICD-10-CM

## 2023-04-30 DIAGNOSIS — K529 Noninfective gastroenteritis and colitis, unspecified: Secondary | ICD-10-CM

## 2023-04-30 DIAGNOSIS — I5022 Chronic systolic (congestive) heart failure: Secondary | ICD-10-CM | POA: Diagnosis not present

## 2023-04-30 DIAGNOSIS — F321 Major depressive disorder, single episode, moderate: Secondary | ICD-10-CM

## 2023-04-30 DIAGNOSIS — N189 Chronic kidney disease, unspecified: Secondary | ICD-10-CM

## 2023-04-30 DIAGNOSIS — I10 Essential (primary) hypertension: Secondary | ICD-10-CM

## 2023-04-30 DIAGNOSIS — D509 Iron deficiency anemia, unspecified: Secondary | ICD-10-CM

## 2023-04-30 LAB — BRAIN NATRIURETIC PEPTIDE: B Natriuretic Peptide: 49.3 pg/mL (ref 0.0–100.0)

## 2023-04-30 NOTE — Patient Instructions (Signed)
Restart your sertraline 50mg  Follow up 1 week with me Keep taking your insulin- check your sugars 2x a day and write them down We're going to let Kelsey Mcdowell and Kelsey Mcdowell mess with your BP medicine for now Keep Taking what you're taking right now

## 2023-04-30 NOTE — Progress Notes (Signed)
BP 131/72   Pulse 76   Temp 98.6 F (37 C) (Oral)   Ht 5' 0.5" (1.537 m)   Wt 196 lb 4.8 oz (89 kg)   SpO2 92%   BMI 37.71 kg/m    Subjective:    Patient ID: Kelsey Mcdowell, female    DOB: Sep 17, 1946, 77 y.o.   MRN: [id number]  HPI: Kelsey Mcdowell is a 77 y.o. female who presents today to establish care and for hospital follow up.  Chief Complaint  Patient presents with   Establish Care   Medication Management    Patient was recently hospitalized for AKI and was told to stop all her medications after being hospitalized and discharged on Sunday.    Transition of Care Hospital Follow up.   Hospital/Facility: [facility] D/C Physician: Dr. Luis Abed D/C Date: 04/27/23  Records Requested: 04/30/23 Records Received: 04/30/23 Records Reviewed: 04/30/23  Diagnoses on Discharge: Acute kidney injury superimposed on chronic kidney disease (CMS-HCC) (POA: Unknown) Obesity (POA: Unknown) Heart failure with mildly reduced ejection fraction (HFmrEF) (CMS-HCC) (POA: Unknown) HTN (hypertension) (POA: Unknown) T2DM (type 2 diabetes mellitus) (CMS-HCC) (POA: Unknown) Neuropathy (POA: Unknown) GERD (gastroesophageal reflux disease) (POA: Unknown) Tobacco use disorder (POA: Yes) COPD (chronic obstructive pulmonary disease) (CMS-HCC) (POA: Unknown) Psoriasis (POA: Unknown) Weakness (POA: Unknown) Diarrhea (POA: Unknown) Iron deficiency anemia (POA: Unknown) Anxiety (POA: Unknown) Depression (POA: Unknown)   Date of interactive Contact within 48 hours of discharge: 04/30/23 Contact was through: direct  Date of 7 day or 14 day face-to-face visit:  04/30/23  within 7 days  Outpatient Encounter Medications as of 04/30/2023  Medication Sig Note   acetaminophen (TYLENOL) 500 MG tablet Take 500 mg by mouth every 6 (six) hours as needed (back pain).    fluticasone (FLONASE) 50 MCG/ACT nasal spray 2 sprays into each nostril daily.    insulin aspart (NOVOLOG) 100 UNIT/ML injection Inject 7  Units into the skin 3 (three) times daily with meals. As needed    Insulin Pen Needle 34G X 3.5 MM MISC 1 Dose by Does not apply route daily.    metoprolol succinate (TOPROL-XL) 25 MG 24 hr tablet Take 25 mg by mouth daily.    Multiple Vitamin (MULTIVITAMIN WITH MINERALS) TABS tablet Take 1 tablet by mouth daily.    nortriptyline (PAMELOR) 25 MG capsule Take 50 mg by mouth daily.    ONETOUCH VERIO test strip 1 each 3 (three) times daily.    Secukinumab (COSENTYX [place]) Inject into the skin every 30 (thirty) days. Last date give was the week of 11/25/22. Pt unsure of exact date. 05/09/2023: Last dose beginning of April   triamcinolone ointment (KENALOG) 0.5 % Apply 1 Application topically daily as needed.    vitamin B-12 (CYANOCOBALAMIN) 1000 MCG tablet Take 1,000 mcg by mouth daily.    [DISCONTINUED] gabapentin (NEURONTIN) 300 MG capsule Take 300 mg by mouth daily. Take 2 capsules (600mg ) by mouth every morning and take 3 capsules (900mg ) by mouth every night (Patient not taking: Reported on 05/08/2023)    [DISCONTINUED] insulin glargine (LANTUS) 100 UNIT/ML Solostar Pen Inject 30 Units into the skin daily. (Patient taking differently: Inject 20-30 Units into the skin daily.)    ARIPiprazole (ABILIFY) 2 MG tablet Take 2 mg by mouth daily.    dapagliflozin propanediol (FARXIGA) 10 MG TABS tablet Take 1 tablet (10 mg total) by mouth daily before breakfast.    furosemide (LASIX) 20 MG tablet Take 1 tablet (20 mg total) by mouth daily. (Patient not  taking: Reported on 04/30/2023)    omeprazole (PRILOSEC) 20 MG capsule Take 20 mg by mouth daily.    rosuvastatin (CRESTOR) 10 MG tablet Take 10 mg by mouth at bedtime.    sacubitril-valsartan (ENTRESTO) 24-26 MG Take 1 tablet by mouth 2 (two) times daily.    sertraline (ZOLOFT) 50 MG tablet Take 1 tablet (50 mg total) by mouth daily.    spironolactone (ALDACTONE) 25 MG tablet Take 1/2 (one-half) tablet by mouth once daily (Patient not taking: Reported on 04/30/2023)     [DISCONTINUED] ALPRAZolam (XANAX) 0.25 MG tablet Take 0.25 mg by mouth daily at 6 (six) AM. (Patient not taking: Reported on 04/30/2023)    [DISCONTINUED] metFORMIN (GLUCOPHAGE) 500 MG tablet Take 1,000 mg by mouth 2 (two) times daily with a meal. (Patient not taking: Reported on 04/30/2023)    [DISCONTINUED] Semaglutide,0.25 or 0.5MG /DOS, 2 MG/3ML SOPN Inject 0.5 mg into the skin once a week.    [DISCONTINUED] sertraline (ZOLOFT) 50 MG tablet Take 50 mg by mouth daily. (Patient not taking: Reported on 04/30/2023)    No facility-administered encounter medications on file as of 04/30/2023.  Per Hospitalist: "Geriatric Assessment Summary: Kelsey Mcdowell is a very pleasant 77 year old female with decreased strength, weakness which she reports has basically been going on for close to a year, but much worse in this past week.  ##Weakness## - Acutely, likely related to her acute renal failure. Chronically more difficult to tease out. May be related to her chronic comorbid conditions - heart failure, diabetes, obesity, copd and iron deficit. She is also on several medications which will contribute to suppressed CNS activation - gabapentin xanax, nortriptyline, - I would suggest as outpatient slowing weaning off these medications - She may also benefit from going to a senior center, participating in any type of exercise / social program as she is relatively reclusive in her home.  ##Gait imbalance## - Likely from an element of peripheral neuropathy along with generalized deconditioning - work with PT and OT - try to engage in a regular exercise program as an outpatient.  Narrative big picture summary  Kelsey Mcdowell is a very pleasant 77 year old female admitted with weakness. She lives by herself in [place]. Over the past year or so, and more so acutely, her day to day activities have been relatively limited. She spends most of the time in her own home, only getting out to go to church and after church to visit  one friend. As she has been feeling even more poorly acutely, she has not gone to church in the past two weeks. She has a niece whom lives in [place] (Kelsey Mcdowell lives in [place], so close by) who helps with grocery shopping, heavy house cleaning and just generally "checks up on me".  Hospital Course:  CHAYLEE BESANCON is a 77 y.o. female with a PMHx of HFmrEF (EF 40-45%), HTN, HLD, COPD on 2L [place], T2DM, obesity, tobacco use, GERD, psoriasis, anxiety/depression that presented to [facility] with several days of diarrhea, decreased PO intake, and weakness found to have oliguric AKI.  Below is a hospital course by problem:  Acute on chronic kidney disease, likely 2/2 ATN Presented with oliguria and elevated BUN and creatinine (54 and 5.95, respectively) in the setting of several days diarrhea and poor oral intake. Baseline creatinine of 1.4-1.7. Initial urinalysis showed some WBC and leukocyte estrace, urine culture showed mixed flora. Repeat urinalysis was normal. Urine microscopy showed muddy brown casts. Nephrology team thinks this is likely  ATN due to dehydration in setting of GI illness with continued diuretic use. They recommended discontinuing hydrochlorothiazide and NSAID use in the future. We continued to hold furosemide, aldactone, entresto, hydrochlorothiazide, dapagliflozin during hospital stay, encouraged PO intake, and monitored BMP.  Chronic weakness  Diarrhea (resolved) Presented with several days of nonbloody diarrhea that has subsequently resolved over the last few days, with associated acute on chronic weakness. No fevers, chills, new respiratory symptoms, or known sick contacts. RPP was negative. She has had chronic weakness over the last year that has acutely worsened in setting of diarrhea and poor oral intake. Strength on exam is overall reassuring and her reflexes are intact. She enjoyed working with PT and feels less weak than when she came in.  Polypharmacy We  discussed some of the medication she is on including alprazolam, ariprazole, and nortriptyline that could have more harmful effects than benefits. Per Ms. Dorothey Baseman, she takes alprazolam to help her sleep (we have been giving it in the morning, she reports normally taking it at night), aripiprazole for anxiety, and nortriptyline for diabetic neuropathy. She is interested in decreasing some of the medications she is on. We decreased nortriptyline to 50 mg while in hospital. We recommend discontinuing/weaning benzos for sleep and selecting alternative therapy. Reduced amitriptyline this hospitalization, recommend discontinuation while potentially switching SSRI to SNRI to help with neuropathic pain plus depression.  HFrEF  Hypertension Follows with cardiology at [facility], most recently seen 04/02/2023. TTE 01/2022 with EF 40-45%. Is on GDMT as below, held in setting of AKI. Mild crackles on physical exam, increased left leg edema which she reports is typical for her.  Type 2 DM Ms. Stoneburner had relatively stable POCT glucose readings. She had one POCT glucose of 333 on 5/3 and received an additional 9 units of insulin. We continued to hold metformin and dapagliflozin given AKI. She was restarted on her home insulin regimen and counseled to closely monitor blood sugar levels at home.  Iron Deficiency Anemia Hemoglobin stable at around 10.5-10.8 which seems to be her relative baseline. No acute signs of bleeding. Ferritin level was 17.9, iron sat of 24%. Repleted iron stores with IV iron 750 mg in the setting of heart failure.  The patient's hospital stay has been complicated by the following clinically significant conditions requiring additional evaluation and treatment or having a significant effect of this patient's care: - Chronic kidney disease POA requiring further investigation, treatment, or monitoring - Hypokalemia POA requiring further investigation, treatment, or monitoring - Dehydration POA  requiring further investigation, treatment, or monitoring"  Diagnostic Tests Reviewed: EXAM: US RENAL COMPLETE ACCESSION: [id number] UN  CLINICAL INDICATION: 77 years old with renal failure    COMPARISON: None.  TECHNIQUE: Static and cine images of the kidneys and bladder were performed.  FINDINGS:  KIDNEYS: Normal size and echogenicity. No solid masses or calculi. No hydronephrosis.      Right kidney:  9.6   cm      Left kidney: 10.2 cm  BLADDER: Unremarkable.      Bladder volume prevoid: 155.58 mL Procedure Note  Lennox Grumbles, MD - 04/24/2023 Formatting of this note might be different from the original. EXAM: US RENAL COMPLETE ACCESSION: [id number] UN  CLINICAL INDICATION: 77 years old with renal failure    COMPARISON: None.  TECHNIQUE: Static and cine images of the kidneys and bladder were performed.  FINDINGS:  KIDNEYS: Normal size and echogenicity. No solid masses or calculi. No hydronephrosis.      Right kidney:  9.6  cm      Left kidney: 10.2 cm  BLADDER: Unremarkable.      Bladder volume prevoid: 155.58 mL  Disposition: Home  Consults: None  Discharge Instructions: Recheck BMP and Mg  Restarting home medications after resolution of kidney injury including Lasix, Entresto, Farxiga, Spironolatone  Recommend discontinuing/weaning benzos for sleep and selecting alternative therapy. Reduced amitriptyline this hospitalization, recommend discontinuation while potentially switching SSRI to SNRI to help with neuropathic pain plus depression.   Disease/illness Education: Discussed today  Home Health/Community Services Discussions/Referrals: In place  Establishment or re-establishment of referral orders for community resources: In place  Discussion with other health care providers: N/A  Assessment and Support of treatment regimen adherence: Fair  Appointments Coordinated with: patient and niece  Education for self-management, independent living, and  ADLs: Discussed today   Still having a little bit of diarrhea last night and the day before. Was on otezla and it was giving her diarrhea- stopped it about a year ago, has not had a change in her diarrhea. Started on ozempic about 6 weeks ago and has not taken it in about 2 weeks. Does not feel like her diarrhea changed when she was on the ozempic. She has not had a full work up on the diarrhea. She is still taking her insulin, metoprolol, gabapentin, nortrilytine and B12, but most of her other medicine has been stopped.   Active Ambulatory Problems    Diagnosis Date Noted   Lumbar spondylosis 08/05/2017   Lumbar degenerative disc disease 08/05/2017   Lumbar radiculopathy 08/05/2017   S/P cervical spinal fusion 08/05/2017   Neuropathy 08/05/2017   DM II (diabetes mellitus, type II), controlled (HCC) 01/28/2022   Tobacco abuse 01/28/2022   Hypomagnesemia 01/28/2022   COPD exacerbation (HCC) 01/28/2022   Obesity (BMI 30-39.9) 01/28/2022   Acute on chronic respiratory failure with hypoxia and hypercapnia (HCC) 01/29/2022   Acute on chronic systolic CHF (congestive heart failure) (HCC) 01/29/2022   Iron deficiency anemia 01/29/2022   Acute kidney injury superimposed on chronic kidney disease (HCC) 05/11/2023   Hypertension 05/11/2023   Depression, major, single episode, moderate (HCC) 05/11/2023   Resolved Ambulatory Problems    Diagnosis Date Noted   Right leg pain 08/05/2017   Acute respiratory failure with hypoxia and hypercapnia (HCC) 01/28/2022   Acute respiratory failure with hypercapnia (HCC) 01/28/2022   Congestive heart failure (HCC) 01/28/2022   Elevated troponin 01/28/2022   Anemia, unspecified 01/28/2022   Elevated procalcitonin 01/28/2022   Lactic acidosis 01/28/2022   Past Medical History:  Diagnosis Date   Arthritis    CHF (congestive heart failure) (HCC)    COPD (chronic obstructive pulmonary disease) (HCC)    Diabetes mellitus without complication (HCC)    GERD  (gastroesophageal reflux disease)    Hyperlipemia    Myocardial infarction [facility])    Sleep apnea    Past Surgical History:  Procedure Laterality Date   ABDOMINAL HYSTERECTOMY     APPENDECTOMY     BREAST EXCISIONAL BIOPSY Left 1970's   benign   CHOLECYSTECTOMY     COLONOSCOPY WITH PROPOFOL N/A 09/30/2018   Procedure: COLONOSCOPY WITH PROPOFOL;  Surgeon: Toledo, Boykin Nearing, MD;  Location: [facility] ENDOSCOPY;  Service: Gastroenterology;  Laterality: N/A;   COLONOSCOPY WITH PROPOFOL N/A 07/09/2022   Procedure: COLONOSCOPY WITH PROPOFOL;  Surgeon: Regis Bill, MD;  Location: [facility] ENDOSCOPY;  Service: Endoscopy;  Laterality: N/A;  IDDM   COLONOSCOPY WITH PROPOFOL N/A 01/21/2023   Procedure: COLONOSCOPY WITH PROPOFOL;  Surgeon: Eather Colas  T, MD;  Location: [facility] ENDOSCOPY;  Service: Endoscopy;  Laterality: N/A;   ESOPHAGOGASTRODUODENOSCOPY (EGD) WITH PROPOFOL N/A 07/09/2022   Procedure: ESOPHAGOGASTRODUODENOSCOPY (EGD) WITH PROPOFOL;  Surgeon: Regis Bill, MD;  Location: [facility] ENDOSCOPY;  Service: Endoscopy;  Laterality: N/A;   SPINE SURGERY  1998   cervical fusion   Outpatient Encounter Medications as of 04/30/2023  Medication Sig Note   acetaminophen (TYLENOL) 500 MG tablet Take 500 mg by mouth every 6 (six) hours as needed (back pain).    fluticasone (FLONASE) 50 MCG/ACT nasal spray 2 sprays into each nostril daily.    insulin aspart (NOVOLOG) 100 UNIT/ML injection Inject 7 Units into the skin 3 (three) times daily with meals. As needed    Insulin Pen Needle 34G X 3.5 MM MISC 1 Dose by Does not apply route daily.    metoprolol succinate (TOPROL-XL) 25 MG 24 hr tablet Take 25 mg by mouth daily.    Multiple Vitamin (MULTIVITAMIN WITH MINERALS) TABS tablet Take 1 tablet by mouth daily.    nortriptyline (PAMELOR) 25 MG capsule Take 50 mg by mouth daily.    ONETOUCH VERIO test strip 1 each 3 (three) times daily.    Secukinumab (COSENTYX [place]) Inject into the skin every 30 (thirty)  days. Last date give was the week of 11/25/22. Pt unsure of exact date. 05/09/2023: Last dose beginning of April   triamcinolone ointment (KENALOG) 0.5 % Apply 1 Application topically daily as needed.    vitamin B-12 (CYANOCOBALAMIN) 1000 MCG tablet Take 1,000 mcg by mouth daily.    [DISCONTINUED] gabapentin (NEURONTIN) 300 MG capsule Take 300 mg by mouth daily. Take 2 capsules (600mg ) by mouth every morning and take 3 capsules (900mg ) by mouth every night (Patient not taking: Reported on 05/08/2023)    [DISCONTINUED] insulin glargine (LANTUS) 100 UNIT/ML Solostar Pen Inject 30 Units into the skin daily. (Patient taking differently: Inject 20-30 Units into the skin daily.)    ARIPiprazole (ABILIFY) 2 MG tablet Take 2 mg by mouth daily.    dapagliflozin propanediol (FARXIGA) 10 MG TABS tablet Take 1 tablet (10 mg total) by mouth daily before breakfast.    furosemide (LASIX) 20 MG tablet Take 1 tablet (20 mg total) by mouth daily. (Patient not taking: Reported on 04/30/2023)    omeprazole (PRILOSEC) 20 MG capsule Take 20 mg by mouth daily.    rosuvastatin (CRESTOR) 10 MG tablet Take 10 mg by mouth at bedtime.    sacubitril-valsartan (ENTRESTO) 24-26 MG Take 1 tablet by mouth 2 (two) times daily.    sertraline (ZOLOFT) 50 MG tablet Take 1 tablet (50 mg total) by mouth daily.    spironolactone (ALDACTONE) 25 MG tablet Take 1/2 (one-half) tablet by mouth once daily (Patient not taking: Reported on 04/30/2023)    [DISCONTINUED] ALPRAZolam (XANAX) 0.25 MG tablet Take 0.25 mg by mouth daily at 6 (six) AM. (Patient not taking: Reported on 04/30/2023)    [DISCONTINUED] metFORMIN (GLUCOPHAGE) 500 MG tablet Take 1,000 mg by mouth 2 (two) times daily with a meal. (Patient not taking: Reported on 04/30/2023)    [DISCONTINUED] Semaglutide,0.25 or 0.5MG /DOS, 2 MG/3ML SOPN Inject 0.5 mg into the skin once a week.    [DISCONTINUED] sertraline (ZOLOFT) 50 MG tablet Take 50 mg by mouth daily. (Patient not taking: Reported on  04/30/2023)    No facility-administered encounter medications on file as of 04/30/2023.   Allergies  Allergen Reactions   Amoxicillin Other (See Comments)    Other Reaction: GI upset, cramps  Glipizide Other (See Comments)   Social History   Socioeconomic History   Marital status: Widowed    Spouse name: Not on file   Number of children: Not on file   Years of education: Not on file   Highest education level: Not on file  Occupational History   Not on file  Tobacco Use   Smoking status: Every Day    Types: Cigarettes    Last attempt to quit: 12/31/2022    Years since quitting: 0.3   Smokeless tobacco: Never  Vaping Use   Vaping Use: Never used  Substance and Sexual Activity   Alcohol use: Yes    Comment: occasional   Drug use: No   Sexual activity: Not on file  Other Topics Concern   Not on file  Social History Narrative   Not on file   Social Determinants of Health   Financial Resource Strain: Not on file  Food Insecurity: Not on file  Transportation Needs: Not on file  Physical Activity: Not on file  Stress: Not on file  Social Connections: Not on file   Family History  Problem Relation Age of Onset   Cancer Mother    Hypertension Mother    Diabetes Mother    Heart disease Father    Diabetes Father    Cancer Sister    Breast cancer Sister 17   Heart disease Brother      Review of Systems  Constitutional: Negative.   Respiratory: Negative.    Cardiovascular: Negative.   Gastrointestinal: Negative.   Genitourinary: Negative.   Musculoskeletal: Negative.   Psychiatric/Behavioral: Negative.      Per HPI unless specifically indicated above     Objective:    BP 131/72   Pulse 76   Temp 98.6 F (37 C) (Oral)   Ht 5' 0.5" (1.537 m)   Wt 196 lb 4.8 oz (89 kg)   SpO2 92%   BMI 37.71 kg/m   Wt Readings from Last 3 Encounters:  05/09/23 193 lb 12.8 oz (87.9 kg)  05/08/23 194 lb 6.4 oz (88.2 kg)  04/30/23 196 lb 4.8 oz (89 kg)    Physical  Exam Vitals and nursing note reviewed.  Constitutional:      General: She is not in acute distress.    Appearance: Normal appearance. She is not ill-appearing, toxic-appearing or diaphoretic.  HENT:     Head: Normocephalic and atraumatic.     Right Ear: External ear normal.     Left Ear: External ear normal.     Nose: Nose normal.     Mouth/Throat:     Mouth: Mucous membranes are moist.     Pharynx: Oropharynx is clear.  Eyes:     General: No scleral icterus.       Right eye: No discharge.        Left eye: No discharge.     Extraocular Movements: Extraocular movements intact.     Conjunctiva/sclera: Conjunctivae normal.     Pupils: Pupils are equal, round, and reactive to light.  Cardiovascular:     Rate and Rhythm: Normal rate and regular rhythm.     Pulses: Normal pulses.     Heart sounds: Normal heart sounds. No murmur heard.    No friction rub. No gallop.  Pulmonary:     Effort: Pulmonary effort is normal. No respiratory distress.     Breath sounds: Normal breath sounds. No stridor. No wheezing, rhonchi or rales.  Chest:     Chest wall:  No tenderness.  Musculoskeletal:        General: Normal range of motion.     Cervical back: Normal range of motion and neck supple.  Skin:    General: Skin is warm and dry.     Capillary Refill: Capillary refill takes less than 2 seconds.     Coloration: Skin is not jaundiced or pale.     Findings: No bruising, erythema, lesion or rash.  Neurological:     General: No focal deficit present.     Mental Status: She is alert and oriented to person, place, and time. Mental status is at baseline.  Psychiatric:        Mood and Affect: Mood normal.        Behavior: Behavior normal.        Thought Content: Thought content normal.        Judgment: Judgment normal.     Results for orders placed or performed during the hospital encounter of 04/30/23  Brain natriuretic peptide  Result Value Ref Range   B Natriuretic Peptide 49.3 0.0 - 100.0  pg/mL      Assessment & Plan:   Problem List Items Addressed This Visit       Cardiovascular and Mediastinum   Acute on chronic systolic CHF (congestive heart failure) (HCC)    Will recheck her kidney function today. BP under good control at this time. Will hold on restarting any of her blood pressure medicines- due to see cardiology and CHF clinic shortly. Call with any concerns.       Relevant Medications   rosuvastatin (CRESTOR) 10 MG tablet   Hypertension    Will recheck her kidney function today. BP under good control at this time. Will hold on restarting any of her blood pressure medicines- due to see cardiology and CHF clinic shortly. Call with any concerns.       Relevant Medications   rosuvastatin (CRESTOR) 10 MG tablet     Endocrine   DM II (diabetes mellitus, type II), controlled (HCC)    Not doing great with A1c of 7.9. Has been taken off her metformin and farxiag due to AKI. Does not seem to have increase in diarrhea on the ozempic. Goal of titrating down on her insulin and up on her ozempic. Will have her monitor her sugars and come in in 1 week to adjust her medicine. Call with any concerns.       Relevant Medications   rosuvastatin (CRESTOR) 10 MG tablet     Genitourinary   Acute kidney injury superimposed on chronic kidney disease (HCC) - Primary    Will recheck her kidney function today. BP under good control at this time. Will hold on restarting any of her blood pressure medicines- due to see cardiology and CHF clinic shortly. Call with any concerns.         Other   Hypomagnesemia    Rechecking labs today. Await results. Treat as needed.       Iron deficiency anemia    Was stable in the hospital. Continue to monitor. Call with any concerns.       Depression, major, single episode, moderate (HCC)    Will restart her sertraline to avoid withdrawal symptoms. Goal of cutting down on medicine. Continue to monitor.       Relevant Medications   sertraline  (ZOLOFT) 50 MG tablet   Other Visit Diagnoses     Chronic diarrhea       Will get records from  previous provider. Colonoscopy done in January. Continue to monitor.   Polypharmacy       Goal of cutting down on her medicine. Will do this slowly. Continue to monitor.        Follow up plan: Return in about 4 weeks (around 05/28/2023).   60 minutes spent with patient and niece today

## 2023-05-08 ENCOUNTER — Ambulatory Visit (INDEPENDENT_AMBULATORY_CARE_PROVIDER_SITE_OTHER): Payer: Medicare Other | Admitting: Family Medicine

## 2023-05-08 ENCOUNTER — Encounter: Payer: Self-pay | Admitting: Family Medicine

## 2023-05-08 VITALS — BP 158/70 | HR 66 | Temp 98.1°F | Ht 60.5 in | Wt 194.4 lb

## 2023-05-08 DIAGNOSIS — Z794 Long term (current) use of insulin: Secondary | ICD-10-CM | POA: Diagnosis not present

## 2023-05-08 DIAGNOSIS — E119 Type 2 diabetes mellitus without complications: Secondary | ICD-10-CM | POA: Diagnosis not present

## 2023-05-08 DIAGNOSIS — N1832 Chronic kidney disease, stage 3b: Secondary | ICD-10-CM | POA: Diagnosis not present

## 2023-05-08 DIAGNOSIS — M5416 Radiculopathy, lumbar region: Secondary | ICD-10-CM | POA: Diagnosis not present

## 2023-05-08 MED ORDER — SERTRALINE HCL 50 MG PO TABS: 50.0000 mg | ORAL_TABLET | Freq: Every day | ORAL | 1 refills | Status: DC

## 2023-05-08 MED ORDER — SEMAGLUTIDE (1 MG/DOSE) 4 MG/3ML ~~LOC~~ SOPN
1.0000 mg | PEN_INJECTOR | SUBCUTANEOUS | 3 refills | Status: DC
Start: 2023-05-08 — End: 2023-09-15

## 2023-05-08 MED ORDER — GABAPENTIN 300 MG PO CAPS: ORAL_CAPSULE | ORAL | 3 refills | Status: DC

## 2023-05-08 MED ORDER — INSULIN GLARGINE 100 UNIT/ML SOLOSTAR PEN: 20.0000 [IU] | PEN_INJECTOR | Freq: Every day | SUBCUTANEOUS | 0 refills | Status: DC

## 2023-05-08 NOTE — Patient Instructions (Addendum)
Give yourself 2 of the 0.5 shots of ozempic until you use up the pen you have at home.  New Rx for 1mg  shot ozempic is at the pharmacy waiting for you Do NOT do the humalog unless your sugar is high Cut your lantus to 20units at bedtime Keep an eye on your sugars again- write them down.   For the gabapentin: Start taking 1 cap BID for 3-4 days, then 1 cap in the AM and 2 caps in the PM for 3-4 days, then 2 caps BID for 3-4 days, then 2 caps in the AM and 3 caps in the PM

## 2023-05-08 NOTE — Progress Notes (Signed)
BP (!) 158/70   Pulse 66   Temp 98.1 F (36.7 C) (Oral)   Ht 5' 0.5" (1.537 m)   Wt 194 lb 6.4 oz (88.2 kg)   SpO2 95%   BMI 37.34 kg/m    Subjective:    Patient ID: Kelsey Mcdowell, female    DOB: 02-Dec-1946, 77 y.o.   MRN: [id number]  HPI: Kelsey Mcdowell is a 77 y.o. female  Chief Complaint  Patient presents with   Diabetes   DIABETES Hypoglycemic episodes:no Polydipsia/polyuria: no Visual disturbance: no Chest pain: no Paresthesias: no Glucose Monitoring: yes  Accucheck frequency: TID  Fasting glucose: 60s, 85  Evening: 210-220s Taking Insulin?: yes  Long acting insulin: 30 units daily  Short acting insulin: sliding scale- only used it 1x Blood Pressure Monitoring: a few times a month Retinal Examination: Up to Date Foot Exam: Up to Date Diabetic Education: Completed Pneumovax: unknown Influenza: unknown Aspirin: no  Relevant past medical, surgical, family and social history reviewed and updated as indicated. Interim medical history since our last visit reviewed. Allergies and medications reviewed and updated.  Review of Systems  Constitutional: Negative.   Respiratory: Negative.    Cardiovascular: Negative.   Gastrointestinal: Negative.   Musculoskeletal: Negative.   Neurological:  Positive for weakness and numbness. Negative for dizziness, tremors, seizures, syncope, facial asymmetry, speech difficulty, light-headedness and headaches.  Psychiatric/Behavioral: Negative.      Per HPI unless specifically indicated above     Objective:    BP (!) 158/70   Pulse 66   Temp 98.1 F (36.7 C) (Oral)   Ht 5' 0.5" (1.537 m)   Wt 194 lb 6.4 oz (88.2 kg)   SpO2 95%   BMI 37.34 kg/m   Wt Readings from Last 3 Encounters:  05/09/23 193 lb 12.8 oz (87.9 kg)  05/08/23 194 lb 6.4 oz (88.2 kg)  04/30/23 196 lb 4.8 oz (89 kg)    Physical Exam Vitals and nursing note reviewed.  Constitutional:      General: She is not in acute  distress.    Appearance: Normal appearance. She is not ill-appearing, toxic-appearing or diaphoretic.  HENT:     Head: Normocephalic and atraumatic.     Right Ear: External ear normal.     Left Ear: External ear normal.     Nose: Nose normal.     Mouth/Throat:     Mouth: Mucous membranes are moist.     Pharynx: Oropharynx is clear.  Eyes:     General: No scleral icterus.       Right eye: No discharge.        Left eye: No discharge.     Extraocular Movements: Extraocular movements intact.     Conjunctiva/sclera: Conjunctivae normal.     Pupils: Pupils are equal, round, and reactive to light.  Cardiovascular:     Rate and Rhythm: Normal rate and regular rhythm.     Pulses: Normal pulses.     Heart sounds: Normal heart sounds. No murmur heard.    No friction rub. No gallop.  Pulmonary:     Effort: Pulmonary effort is normal. No respiratory distress.     Breath sounds: Normal breath sounds. No stridor. No wheezing, rhonchi or rales.  Chest:     Chest wall: No tenderness.  Musculoskeletal:        General: Normal range of motion.     Cervical back: Normal range of motion and neck supple.  Skin:    General: Skin  is warm and dry.     Capillary Refill: Capillary refill takes less than 2 seconds.     Coloration: Skin is not jaundiced or pale.     Findings: No bruising, erythema, lesion or rash.  Neurological:     General: No focal deficit present.     Mental Status: She is alert and oriented to person, place, and time. Mental status is at baseline.  Psychiatric:        Mood and Affect: Mood normal.        Behavior: Behavior normal.        Thought Content: Thought content normal.        Judgment: Judgment normal.     Results for orders placed or performed in visit on 05/08/23  Basic metabolic panel  Result Value Ref Range   Glucose 144 (H) 70 - 99 mg/dL   BUN 13 8 - 27 mg/dL   Creatinine, Ser 5.40 (H) 0.57 - 1.00 mg/dL   eGFR 30 (L) >98 JX/BJY/7.82   BUN/Creatinine Ratio 7  (L) 12 - 28   Sodium 143 134 - 144 mmol/L   Potassium 4.3 3.5 - 5.2 mmol/L   Chloride 105 96 - 106 mmol/L   CO2 23 20 - 29 mmol/L   Calcium 8.9 8.7 - 10.3 mg/dL      Assessment & Plan:   Problem List Items Addressed This Visit       Endocrine   DM II (diabetes mellitus, type II), controlled (HCC) - Primary    Will increase her ozempic to 1mg  and cut her lantus to 20 units a day and only use her humalog if her sugar is high. Recheck in 1 month. Will keep monitoring her sugars and call with any concerns.       Relevant Medications   Semaglutide, 1 MG/DOSE, 4 MG/3ML SOPN   insulin glargine (LANTUS) 100 UNIT/ML Solostar Pen   Other Relevant Orders   Basic metabolic panel (Completed)     Nervous and Auditory   Lumbar radiculopathy    Having increased pain on lower dose of gabapentin. Will titrate her back up on gabapentin. Hold on changing amitriptyline. Consider changing her sertraline to cymbalta in future. Recheck 1 month.       Relevant Medications   gabapentin (NEURONTIN) 300 MG capsule     Follow up plan: Return in about 4 weeks (around 06/05/2023).

## 2023-05-09 ENCOUNTER — Encounter: Payer: Self-pay | Admitting: Family

## 2023-05-09 ENCOUNTER — Ambulatory Visit: Payer: Medicare Other | Attending: Family | Admitting: Family

## 2023-05-09 VITALS — BP 158/71 | HR 88 | Wt 193.8 lb

## 2023-05-09 DIAGNOSIS — E1122 Type 2 diabetes mellitus with diabetic chronic kidney disease: Secondary | ICD-10-CM | POA: Diagnosis not present

## 2023-05-09 DIAGNOSIS — I428 Other cardiomyopathies: Secondary | ICD-10-CM | POA: Insufficient documentation

## 2023-05-09 DIAGNOSIS — I5022 Chronic systolic (congestive) heart failure: Secondary | ICD-10-CM | POA: Insufficient documentation

## 2023-05-09 DIAGNOSIS — F17210 Nicotine dependence, cigarettes, uncomplicated: Secondary | ICD-10-CM | POA: Diagnosis not present

## 2023-05-09 DIAGNOSIS — E785 Hyperlipidemia, unspecified: Secondary | ICD-10-CM | POA: Insufficient documentation

## 2023-05-09 DIAGNOSIS — Z79899 Other long term (current) drug therapy: Secondary | ICD-10-CM | POA: Diagnosis not present

## 2023-05-09 DIAGNOSIS — T148XXA Other injury of unspecified body region, initial encounter: Secondary | ICD-10-CM

## 2023-05-09 DIAGNOSIS — I110 Hypertensive heart disease with heart failure: Secondary | ICD-10-CM | POA: Diagnosis not present

## 2023-05-09 DIAGNOSIS — N1831 Chronic kidney disease, stage 3a: Secondary | ICD-10-CM

## 2023-05-09 DIAGNOSIS — K219 Gastro-esophageal reflux disease without esophagitis: Secondary | ICD-10-CM | POA: Insufficient documentation

## 2023-05-09 DIAGNOSIS — I252 Old myocardial infarction: Secondary | ICD-10-CM | POA: Diagnosis not present

## 2023-05-09 DIAGNOSIS — I10 Essential (primary) hypertension: Secondary | ICD-10-CM | POA: Diagnosis not present

## 2023-05-09 DIAGNOSIS — E1140 Type 2 diabetes mellitus with diabetic neuropathy, unspecified: Secondary | ICD-10-CM | POA: Diagnosis not present

## 2023-05-09 DIAGNOSIS — S50811A Abrasion of right forearm, initial encounter: Secondary | ICD-10-CM | POA: Insufficient documentation

## 2023-05-09 DIAGNOSIS — G4730 Sleep apnea, unspecified: Secondary | ICD-10-CM | POA: Insufficient documentation

## 2023-05-09 DIAGNOSIS — J449 Chronic obstructive pulmonary disease, unspecified: Secondary | ICD-10-CM | POA: Diagnosis not present

## 2023-05-09 DIAGNOSIS — Z794 Long term (current) use of insulin: Secondary | ICD-10-CM

## 2023-05-09 DIAGNOSIS — E669 Obesity, unspecified: Secondary | ICD-10-CM | POA: Insufficient documentation

## 2023-05-09 DIAGNOSIS — Z7984 Long term (current) use of oral hypoglycemic drugs: Secondary | ICD-10-CM | POA: Insufficient documentation

## 2023-05-09 LAB — BASIC METABOLIC PANEL
BUN/Creatinine Ratio: 7 — ABNORMAL LOW (ref 12–28)
BUN: 13 mg/dL (ref 8–27)
CO2: 23 mmol/L (ref 20–29)
Calcium: 8.9 mg/dL (ref 8.7–10.3)
Chloride: 105 mmol/L (ref 96–106)
Creatinine, Ser: 1.74 mg/dL — ABNORMAL HIGH (ref 0.57–1.00)
Glucose: 144 mg/dL — ABNORMAL HIGH (ref 70–99)
Potassium: 4.3 mmol/L (ref 3.5–5.2)
Sodium: 143 mmol/L (ref 134–144)
eGFR: 30 mL/min/{1.73_m2} — ABNORMAL LOW (ref >59)

## 2023-05-09 NOTE — Patient Instructions (Addendum)
[facility] to [facility] one week before your next appointment.    Keep your daily sodium to less than 2000mg  daily

## 2023-05-09 NOTE — Progress Notes (Signed)
[facility] - Pharmacist Note  Kelsey Mcdowell is a 77 y.o. female with HFmrEF (EF 41-49%) presenting to the Heart Failure Clinic for follow up. Patient reports that she has been slowly resuming medications since hospitalization with AKI earlier this year. She has not yet resumed MRA or diuretic. She reports doing well on current regimen, though she does have frequent diarrhea. She wonders if the diarrhea is associated with one of her medications. Patient was unable to provide a timeline of when the diarrhea started. She reports that she will be increasing her Ozempic dose per her PCP this weekend. We discussed that she should notify her PCP if she has worsening diarrhea after increasing the dose of her Ozempic. She reports that her appetite is reduced since starting the Ozempic. She is excited that her weight is downtrending lately. She reports no signs or symptoms of volume overload.   Recent ED Visit (past 6 months): none  Guideline-Directed Medical Therapy/Evidence Based Medicine ACE/ARB/ARNI: Sacubitril/valsartan 24/26 mg BID Beta Blocker: Metoprolol succinate 25 mg daily Aldosterone Antagonist:  none Diuretic:  none SGLT2i: Dapagliflozin 10 mg daily  Adherence Assessment Do you ever forget to take your medication? [] Yes [x] No  Do you ever skip doses due to side effects? [] Yes [x] No  Do you have trouble affording your medicines? [] Yes [x] No  Are you ever unable to pick up your medication due to transportation difficulties? [] Yes [x] No  Do you ever stop taking your medications because you don't believe they are helping? [] Yes [x] No  Do you check your weight daily? [x] Yes [] No  Adherence strategy: pill box Barriers to obtaining medications: none reported  Diagnostics ECHO: Date 01/28/2022, EF 40-45%, GHK, mild LVH, G2DD  Vitals    05/08/2023   11:15 AM 05/08/2023   10:44 AM 05/08/2023   10:38 AM  Vitals with BMI  Height   5' 0.5"  Weight   194 lbs 6 oz   BMI   37.33  Systolic 158 163 161  Diastolic 70 68 68  Pulse  66 65     Recent Labs    Latest Ref Rng & Units 05/08/2023   11:19 AM 01/16/2023   10:15 AM 12/04/2022   12:02 PM  BMP  Glucose 70 - 99 mg/dL 096  045  409   BUN 8 - 27 mg/dL 13  25  19    Creatinine 0.57 - 1.00 mg/dL 8.11  9.14  7.82   BUN/Creat Ratio 12 - 28 7     Sodium 134 - 144 mmol/L 143  137  138   Potassium 3.5 - 5.2 mmol/L 4.3  4.0  4.2   Chloride 96 - 106 mmol/L 105  100  104   CO2 20 - 29 mmol/L 23  26  26    Calcium 8.7 - 10.3 mg/dL 8.9  8.8  9.5     Past Medical History Past Medical History:  Diagnosis Date   Arthritis    CHF (congestive heart failure) (HCC)    COPD (chronic obstructive pulmonary disease) (HCC)    Diabetes mellitus without complication (HCC)    GERD (gastroesophageal reflux disease)    Hyperlipemia    Hypertension    Myocardial infarction (HCC)    light heart attack ?when   Neuropathy    Sleep apnea     Plan Continue regimen as directed by NP Annual echo due 01/2023 Alert PCP of new or worsening GI side effects after increasing Ozempic dose  Time spent: 10 minutes  Celene Squibb,  PharmD PGY1 Pharmacy Resident 05/09/2023 11:39 AM

## 2023-05-09 NOTE — Progress Notes (Signed)
PCP: Olevia Perches, DO (last seen 05/24) Primary Cardiologist: Leanora Ivanoff PA (last seen 05/24; returns 06/24)  HPI:  Kelsey Mcdowell is a 77 yo with a PMHx of COPD, acute on chronic systolic CHF, DM II, tobacco abuse, obesity, and HTN.  Echo 01/28/22: EF 40-45% LV with mildly decreased function and global hypokinesis with mild LVH.   Admitted 04/24/23 due to several days of diarrhea, decreased PO intake and worsening weakness. Found to have oliguric AKI. Held furosemide, aldactone, entresto, hydrochlorothiazide, dapagliflozin during hospital stay.   She presents today for a HF follow-up visit with a chief complaint of moderate fatigue with minimal exertion. Chronic in nature. Has associated diarrhea, SOB and  palpitations along with this. Continues to wear her oxygen at bedtime and PRN during the day. With recent admission, neither her spironolactone or furosemide have been resumed due to renal function.   Continues to have daily diarrhea and multiple times during the day although she says that it's "small amounts" but then says that she "stays in the bathroom". She is unable to say if this started when ozempic was started.   Not adding salt but does eat saltier things. Ate a TV dinner last night for supper but said that she didn't eat much else during the day because her appetite is decreased with the ozempic. Has plans to go out to eat at [employer] to celebrate her birthday. Emphasized that she would be getting a lot of sodium when eating out but she says that she probably won't eat anything else that day.   ROS: All systems negative except as listed in HPI, PMH and Problem List.  SH:  Social History   Socioeconomic History   Marital status: Widowed    Spouse name: Not on file   Number of children: Not on file   Years of education: Not on file   Highest education level: Not on file  Occupational History   Not on file  Tobacco Use   Smoking status: Every Day    Types: Cigarettes     Last attempt to quit: 12/31/2022    Years since quitting: 0.3   Smokeless tobacco: Never  Vaping Use   Vaping Use: Never used  Substance and Sexual Activity   Alcohol use: Yes    Comment: occasional   Drug use: No   Sexual activity: Not on file  Other Topics Concern   Not on file  Social History Narrative   Not on file   Social Determinants of Health   Financial Resource Strain: Not on file  Food Insecurity: Not on file  Transportation Needs: Not on file  Physical Activity: Not on file  Stress: Not on file  Social Connections: Not on file  Intimate Partner Violence: Not on file    FH:  Family History  Problem Relation Age of Onset   Cancer Mother    Hypertension Mother    Diabetes Mother    Heart disease Father    Diabetes Father    Cancer Sister    Breast cancer Sister 75   Heart disease Brother     Past Medical History:  Diagnosis Date   Arthritis    CHF (congestive heart failure) (HCC)    COPD (chronic obstructive pulmonary disease) (HCC)    Diabetes mellitus without complication (HCC)    GERD (gastroesophageal reflux disease)    Hyperlipemia    Hypertension    Myocardial infarction (HCC)    light heart attack ?when   Neuropathy  Sleep apnea     Current Outpatient Medications  Medication Sig Dispense Refill   acetaminophen (TYLENOL) 500 MG tablet Take 500 mg by mouth every 6 (six) hours as needed (back pain).     ALPRAZolam (XANAX) 0.25 MG tablet Take 0.25 mg by mouth daily at 6 (six) AM. (Patient not taking: Reported on 04/30/2023)     ARIPiprazole (ABILIFY) 2 MG tablet Take 2 mg by mouth daily.     dapagliflozin propanediol (FARXIGA) 10 MG TABS tablet Take 1 tablet (10 mg total) by mouth daily before breakfast. 90 tablet 3   fluticasone (FLONASE) 50 MCG/ACT nasal spray 2 sprays into each nostril daily.     furosemide (LASIX) 20 MG tablet Take 1 tablet (20 mg total) by mouth daily. (Patient not taking: Reported on 04/30/2023) 30 tablet 0   gabapentin  (NEURONTIN) 300 MG capsule Start taking 1 cap BID for 3-4 days, then 1 cap in the AM and 2 caps in the PM for 3-4 days, then 2 caps BID for 3-4 days, then 2 caps in the AM and 3 caps in the PM 150 capsule 3   insulin aspart (NOVOLOG) 100 UNIT/ML injection Inject 7 Units into the skin 3 (three) times daily with meals.     insulin glargine (LANTUS) 100 UNIT/ML Solostar Pen Inject 20 Units into the skin daily. 15 mL 0   Insulin Pen Needle 34G X 3.5 MM MISC 1 Dose by Does not apply route daily. 100 each 0   metFORMIN (GLUCOPHAGE) 500 MG tablet Take 1,000 mg by mouth 2 (two) times daily with a meal. (Patient not taking: Reported on 04/30/2023)     metoprolol succinate (TOPROL-XL) 25 MG 24 hr tablet Take 25 mg by mouth daily.     Multiple Vitamin (MULTIVITAMIN WITH MINERALS) TABS tablet Take 1 tablet by mouth daily.     nortriptyline (PAMELOR) 25 MG capsule Take 50 mg by mouth daily.     omeprazole (PRILOSEC) 20 MG capsule Take 20 mg by mouth daily.     ONETOUCH VERIO test strip 1 each 3 (three) times daily.     rosuvastatin (CRESTOR) 10 MG tablet Take 10 mg by mouth at bedtime.     sacubitril-valsartan (ENTRESTO) 24-26 MG Take 1 tablet by mouth 2 (two) times daily. 60 tablet 3   Secukinumab (COSENTYX [place]) Inject into the skin every 30 (thirty) days. Last date give was the week of 11/25/22. Pt unsure of exact date.     Semaglutide, 1 MG/DOSE, 4 MG/3ML SOPN Inject 1 mg as directed once a week. 3 mL 3   sertraline (ZOLOFT) 50 MG tablet Take 1 tablet (50 mg total) by mouth daily. 180 tablet 1   spironolactone (ALDACTONE) 25 MG tablet Take 1/2 (one-half) tablet by mouth once daily (Patient not taking: Reported on 04/30/2023) 15 tablet 5   triamcinolone ointment (KENALOG) 0.5 % Apply topically. (Patient not taking: Reported on 05/08/2023)     vitamin B-12 (CYANOCOBALAMIN) 1000 MCG tablet Take 1,000 mcg by mouth daily.     No current facility-administered medications for this visit.   Vitals:   05/09/23 1012   BP: (!) 158/71  Pulse: 88  SpO2: 93%  Weight: 193 lb 12.8 oz (87.9 kg)   Wt Readings from Last 3 Encounters:  05/09/23 193 lb 12.8 oz (87.9 kg)  05/08/23 194 lb 6.4 oz (88.2 kg)  04/30/23 196 lb 4.8 oz (89 kg)   Lab Results  Component Value Date   CREATININE 1.74 (H) 05/08/2023  CREATININE 1.69 (H) 01/16/2023   CREATININE 1.47 (H) 12/04/2022   PHYSICAL EXAM:  General:  Well appearing. No resp difficulty HEENT: normal Neck: supple. JVP flat. No lymphadenopathy or thryomegaly appreciated. Cor: PMI normal. Regular rate & rhythm. No rubs, gallops or murmurs. Lungs: clear Abdomen: soft, nontender, nondistended. No hepatosplenomegaly. No bruits or masses. Extremities: no cyanosis, clubbing, rash. Trace edema bilateral lower legs Neuro: alert & oriented x3, cranial nerves grossly intact. Moves all 4 extremities w/o difficulty. Affect pleasant.   ECG: not done   ASSESSMENT & PLAN:  NICM with mildly reduced ejection fraction- - likely due to to HTN/ COPD - NYHA class III - euvolemic - weighing most days; reviewed the importance of weighing daily and to call for an overnight weight gain of > 2 pounds or a weekly weight gain of > 5 pounds - weight down 6 pounds from last visit here 1 month ago - echo 01/28/22: EF 40-45% LV with mildly decreased function and global hypokinesis with mild LVH; discuss getting this updated - reviewed keeping daily fluid intake to 60-64 ounces - reviewed keeping daily sodium intake to 2000mg  esp since currently not on a diuretic; avoid TV dinners if possible - continue entresto 24/26mg  BID - continue farxiga 10mg  daily - continue metoprolol succinate 25mg  daily - continue holding furosemide 40mg  daily - continue holding spironolactone 12.5mg  daily - BMP in a few weeks prior to next OV and hopefully can resume spiro - saw cardiology Mellissa Kohut) 05/24 - encouraged as much activity as possible - BNP 01/27/22 721.9 - PharmD reconciled meds w/  patient  Hypertension- - BP 158/71 - saw PCP [name]) 05/24 - BMP 05/08/23 reviewed and showed sodium 143, potassium 4.3, creatinine 1.74 and GFR 30  COPD- - saw pulmonologist Meredeth Ide) 12/23 - smoking 1 pack every 2-3 days; does not smoke with oxygen on - continued cessation discussed for 3 minutes - on O2 @ 2L at bedtime and PRN during the day; says that she doesn't wear it much during the day  DM- - A1c 04/24/23 was 7.9%  - ozempic increased to 1mg  yesterday by PCP; if diarrhea worsens, emphasized reaching out to PCP - [facility] had called patient to discuss program but patient then declined  5: Right forearm abrasion- - resolved  Return in 2-3 weeks, sooner if needed. To get lab work drawn the week prior to this visit

## 2023-05-11 ENCOUNTER — Encounter: Payer: Self-pay | Admitting: Family Medicine

## 2023-05-11 DIAGNOSIS — I10 Essential (primary) hypertension: Secondary | ICD-10-CM | POA: Insufficient documentation

## 2023-05-11 DIAGNOSIS — F321 Major depressive disorder, single episode, moderate: Secondary | ICD-10-CM | POA: Insufficient documentation

## 2023-05-11 DIAGNOSIS — N179 Acute kidney failure, unspecified: Secondary | ICD-10-CM | POA: Insufficient documentation

## 2023-05-11 NOTE — Assessment & Plan Note (Signed)
Will recheck her kidney function today. BP under good control at this time. Will hold on restarting any of her blood pressure medicines- due to see cardiology and CHF clinic shortly. Call with any concerns.

## 2023-05-11 NOTE — Assessment & Plan Note (Signed)
Will increase her ozempic to 1mg  and cut her lantus to 20 units a day and only use her humalog if her sugar is high. Recheck in 1 month. Will keep monitoring her sugars and call with any concerns.

## 2023-05-11 NOTE — Assessment & Plan Note (Addendum)
Will restart her sertraline to avoid withdrawal symptoms. Goal of cutting down on medicine. Continue to monitor.

## 2023-05-11 NOTE — Assessment & Plan Note (Signed)
Having increased pain on lower dose of gabapentin. Will titrate her back up on gabapentin. Hold on changing amitriptyline. Consider changing her sertraline to cymbalta in future. Recheck 1 month.

## 2023-05-11 NOTE — Assessment & Plan Note (Signed)
Will recheck her kidney function today. BP under good control at this time. Will hold on restarting any of her blood pressure medicines- due to see cardiology and CHF clinic shortly. Call with any concerns.  

## 2023-05-11 NOTE — Assessment & Plan Note (Signed)
Rechecking labs today. Await results. Treat as needed.  °

## 2023-05-11 NOTE — Assessment & Plan Note (Signed)
Not doing great with A1c of 7.9. Has been taken off her metformin and farxiag due to AKI. Does not seem to have increase in diarrhea on the ozempic. Goal of titrating down on her insulin and up on her ozempic. Will have her monitor her sugars and come in in 1 week to adjust her medicine. Call with any concerns.

## 2023-05-11 NOTE — Assessment & Plan Note (Signed)
Was stable in the hospital. Continue to monitor. Call with any concerns.

## 2023-05-12 NOTE — Addendum Note (Signed)
Addended by: Dorcas Carrow on: 05/12/2023 03:40 PM   Modules accepted: Orders

## 2023-05-30 ENCOUNTER — Telehealth: Payer: Self-pay | Admitting: Family Medicine

## 2023-05-30 NOTE — Telephone Encounter (Signed)
Copied from CRM [id number]. Topic: Medicare AWV >> May 30, 2023 11:15 AM Payton Doughty wrote: Reason for CRM: LM 05/30/2023 to schedule AWV   Verlee Rossetti; Care Guide Ambulatory Clinical Support [place] [facility] Direct Dial: [phone]

## 2023-06-03 LAB — HM DIABETES EYE EXAM

## 2023-06-04 ENCOUNTER — Ambulatory Visit: Payer: Medicare Other | Attending: Family | Admitting: Family

## 2023-06-04 ENCOUNTER — Encounter: Payer: Self-pay | Admitting: Family

## 2023-06-04 VITALS — BP 133/61 | HR 63 | Wt 190.6 lb

## 2023-06-04 DIAGNOSIS — I5022 Chronic systolic (congestive) heart failure: Secondary | ICD-10-CM | POA: Diagnosis not present

## 2023-06-04 DIAGNOSIS — J449 Chronic obstructive pulmonary disease, unspecified: Secondary | ICD-10-CM | POA: Insufficient documentation

## 2023-06-04 DIAGNOSIS — N1831 Chronic kidney disease, stage 3a: Secondary | ICD-10-CM

## 2023-06-04 DIAGNOSIS — F17210 Nicotine dependence, cigarettes, uncomplicated: Secondary | ICD-10-CM | POA: Insufficient documentation

## 2023-06-04 DIAGNOSIS — I110 Hypertensive heart disease with heart failure: Secondary | ICD-10-CM | POA: Insufficient documentation

## 2023-06-04 DIAGNOSIS — Z79899 Other long term (current) drug therapy: Secondary | ICD-10-CM | POA: Diagnosis not present

## 2023-06-04 DIAGNOSIS — E1140 Type 2 diabetes mellitus with diabetic neuropathy, unspecified: Secondary | ICD-10-CM | POA: Diagnosis not present

## 2023-06-04 DIAGNOSIS — I10 Essential (primary) hypertension: Secondary | ICD-10-CM

## 2023-06-04 DIAGNOSIS — I428 Other cardiomyopathies: Secondary | ICD-10-CM | POA: Insufficient documentation

## 2023-06-04 DIAGNOSIS — E669 Obesity, unspecified: Secondary | ICD-10-CM | POA: Diagnosis not present

## 2023-06-04 DIAGNOSIS — E1122 Type 2 diabetes mellitus with diabetic chronic kidney disease: Secondary | ICD-10-CM | POA: Diagnosis not present

## 2023-06-04 DIAGNOSIS — Z794 Long term (current) use of insulin: Secondary | ICD-10-CM

## 2023-06-04 LAB — MICROALBUMIN / CREATININE URINE RATIO: Microalb Creat Ratio: 9

## 2023-06-04 LAB — PROTEIN / CREATININE RATIO, URINE: Creatinine, Urine: 43

## 2023-06-04 LAB — MICROALBUMIN, URINE: Microalb, Ur: 0.4

## 2023-06-04 NOTE — Patient Instructions (Signed)
Get compression socks and put them on every morning with removal at bedtime 

## 2023-06-04 NOTE — Progress Notes (Signed)
PCP: Olevia Perches, DO (last seen 05/24) Primary Cardiologist: Leanora Ivanoff PA (last seen 05/24; returns 06/24)  HPI:  Kelsey Mcdowell is a 77 yo with a PMHx of COPD, acute on chronic systolic CHF, DM II, tobacco abuse, obesity, and HTN.  Echo 01/28/22: EF 40-45% LV with mildly decreased function and global hypokinesis with mild LVH.   Admitted 04/24/23 due to several days of diarrhea, decreased PO intake and worsening weakness. Found to have oliguric AKI. Held furosemide, aldactone, entresto, hydrochlorothiazide, dapagliflozin during hospital stay.   She presents today for a HF follow-up visit with a chief complaint of minimal SOB with moderate exertion. Chronic in nature although she feels like it has improved. She has associated fatigue (improving), cough and pedal edema along with this. Denies chest pain, palpitations, abdominal distention, dizziness or difficulty sleeping.   Has nephrology appointment later this morning.  Continues to be off of her furosemide  ROS: All systems negative except as listed in HPI, PMH and Problem List.  SH:  Social History   Socioeconomic History   Marital status: Widowed    Spouse name: Not on file   Number of children: Not on file   Years of education: Not on file   Highest education level: Not on file  Occupational History   Not on file  Tobacco Use   Smoking status: Every Day    Types: Cigarettes    Last attempt to quit: 12/31/2022    Years since quitting: 0.4   Smokeless tobacco: Never  Vaping Use   Vaping Use: Never used  Substance and Sexual Activity   Alcohol use: Yes    Comment: occasional   Drug use: No   Sexual activity: Not on file  Other Topics Concern   Not on file  Social History Narrative   Not on file   Social Determinants of Health   Financial Resource Strain: Not on file  Food Insecurity: Not on file  Transportation Needs: Not on file  Physical Activity: Not on file  Stress: Not on file  Social Connections: Not on  file  Intimate Partner Violence: Not on file    FH:  Family History  Problem Relation Age of Onset   Cancer Mother    Hypertension Mother    Diabetes Mother    Heart disease Father    Diabetes Father    Cancer Sister    Breast cancer Sister 61   Heart disease Brother     Past Medical History:  Diagnosis Date   Arthritis    CHF (congestive heart failure) (HCC)    COPD (chronic obstructive pulmonary disease) (HCC)    Diabetes mellitus without complication (HCC)    GERD (gastroesophageal reflux disease)    Hyperlipemia    Hypertension    Myocardial infarction (HCC)    light heart attack ?when   Neuropathy    Sleep apnea     Current Outpatient Medications  Medication Sig Dispense Refill   acetaminophen (TYLENOL) 500 MG tablet Take 500 mg by mouth every 6 (six) hours as needed (back pain).     ARIPiprazole (ABILIFY) 2 MG tablet Take 2 mg by mouth daily.     dapagliflozin propanediol (FARXIGA) 10 MG TABS tablet Take 1 tablet (10 mg total) by mouth daily before breakfast. 90 tablet 3   fluticasone (FLONASE) 50 MCG/ACT nasal spray 2 sprays into each nostril daily.     furosemide (LASIX) 20 MG tablet Take 1 tablet (20 mg total) by mouth daily. (Patient not taking:  Reported on 04/30/2023) 30 tablet 0   gabapentin (NEURONTIN) 300 MG capsule Start taking 1 cap BID for 3-4 days, then 1 cap in the AM and 2 caps in the PM for 3-4 days, then 2 caps BID for 3-4 days, then 2 caps in the AM and 3 caps in the PM 150 capsule 3   insulin aspart (NOVOLOG) 100 UNIT/ML injection Inject 7 Units into the skin 3 (three) times daily with meals. As needed     insulin glargine (LANTUS) 100 UNIT/ML Solostar Pen Inject 20 Units into the skin daily. 15 mL 0   Insulin Pen Needle 34G X 3.5 MM MISC 1 Dose by Does not apply route daily. 100 each 0   metoprolol succinate (TOPROL-XL) 25 MG 24 hr tablet Take 25 mg by mouth daily.     Multiple Vitamin (MULTIVITAMIN WITH MINERALS) TABS tablet Take 1 tablet by mouth  daily.     nortriptyline (PAMELOR) 25 MG capsule Take 50 mg by mouth daily.     omeprazole (PRILOSEC) 20 MG capsule Take 20 mg by mouth daily.     ONETOUCH VERIO test strip 1 each 3 (three) times daily.     rosuvastatin (CRESTOR) 10 MG tablet Take 10 mg by mouth at bedtime.     sacubitril-valsartan (ENTRESTO) 24-26 MG Take 1 tablet by mouth 2 (two) times daily. 60 tablet 3   Secukinumab (COSENTYX [facility]) Inject into the skin every 30 (thirty) days. Last date give was the week of 11/25/22. Pt unsure of exact date.     Semaglutide, 1 MG/DOSE, 4 MG/3ML SOPN Inject 1 mg as directed once a week. (Patient taking differently: Inject 1 mg as directed once a week. Mondays) 3 mL 3   sertraline (ZOLOFT) 50 MG tablet Take 1 tablet (50 mg total) by mouth daily. 180 tablet 1   spironolactone (ALDACTONE) 25 MG tablet Take 1/2 (one-half) tablet by mouth once daily (Patient not taking: Reported on 04/30/2023) 15 tablet 5   triamcinolone ointment (KENALOG) 0.5 % Apply 1 Application topically daily as needed.     vitamin B-12 (CYANOCOBALAMIN) 1000 MCG tablet Take 1,000 mcg by mouth daily.     No current facility-administered medications for this visit.   Vitals:   06/04/23 1005  BP: 133/61  Pulse: 63  SpO2: 98%  Weight: 190 lb 9.6 oz (86.5 kg)   Wt Readings from Last 3 Encounters:  06/04/23 190 lb 9.6 oz (86.5 kg)  05/09/23 193 lb 12.8 oz (87.9 kg)  05/08/23 194 lb 6.4 oz (88.2 kg)   PHYSICAL EXAM:  General:  Well appearing. No resp difficulty HEENT: normal Neck: supple. JVP flat. No lymphadenopathy or thryomegaly appreciated. Cor: PMI normal. Regular rate & rhythm. No rubs, gallops or murmurs. Lungs: clear Abdomen: soft, nontender, nondistended. No hepatosplenomegaly. No bruits or masses. Extremities: no cyanosis, clubbing, rash. 1+ edema bilateral lower legs Neuro: alert & oriented x3, cranial nerves grossly intact. Moves all 4 extremities w/o difficulty. Affect pleasant.   ECG: not  done   ASSESSMENT & PLAN:  NICM with mildly reduced ejection fraction- - likely due to to HTN/ COPD - NYHA class II - euvolemic - weighing most days; reviewed the importance of weighing daily and to call for an overnight weight gain of > 2 pounds or a weekly weight gain of > 5 pounds - weight down 3 pounds from last visit here 3 weeks ago - echo 01/28/22: EF 40-45% LV with mildly decreased function and global hypokinesis with mild LVH -  reviewed keeping daily fluid intake to 60-64 ounces - reviewed keeping daily sodium intake to 2000mg  esp since currently not on a diuretic; avoid TV dinners if possible - continue entresto 24/26mg  BID - continue farxiga 10mg  daily - continue metoprolol succinate 25mg  daily - continue spironolactone 12.5mg  daily - continue holding furosemide 40mg  daily - emphasized, again, to get compression socks and put them on every morning with removal at bedtime; increased font on her AVS as she says that she has a hard time seeing fine print - elevate legs when sitting for long periods of time - have messaged nephrology about doing BMP today since she sees him after leaving here - saw cardiology Mellissa Kohut) 05/24 - encouraged as much activity as possible - BNP 01/27/22 721.9  Hypertension- - BP 133/61 - saw PCP [name]) 05/24 - BMP 05/08/23 reviewed and showed sodium 143, potassium 4.3, creatinine 1.74 and GFR 30  COPD- - saw pulmonologist Meredeth Ide) 12/23 - smoking 1 pack every 2-3 days; does not smoke with oxygen on - continued cessation discussed for 3 minutes - on O2 @ 2L at bedtime and PRN during the day; says that she doesn't wear it much during the day  DM- - A1c 04/24/23 was 7.9%  - ozempic 1mg  weekly  Return in 2 months, sooner if needed.

## 2023-06-12 ENCOUNTER — Telehealth: Payer: Self-pay

## 2023-06-12 ENCOUNTER — Encounter: Payer: Self-pay | Admitting: Family Medicine

## 2023-06-12 ENCOUNTER — Ambulatory Visit (INDEPENDENT_AMBULATORY_CARE_PROVIDER_SITE_OTHER): Payer: Medicare Other | Admitting: Family Medicine

## 2023-06-12 VITALS — BP 120/64 | HR 71 | Temp 98.2°F | Ht 60.5 in | Wt 191.2 lb

## 2023-06-12 DIAGNOSIS — G629 Polyneuropathy, unspecified: Secondary | ICD-10-CM | POA: Diagnosis not present

## 2023-06-12 DIAGNOSIS — Z7985 Long-term (current) use of injectable non-insulin antidiabetic drugs: Secondary | ICD-10-CM

## 2023-06-12 DIAGNOSIS — F321 Major depressive disorder, single episode, moderate: Secondary | ICD-10-CM

## 2023-06-12 DIAGNOSIS — Z7189 Other specified counseling: Secondary | ICD-10-CM

## 2023-06-12 DIAGNOSIS — Z23 Encounter for immunization: Secondary | ICD-10-CM | POA: Diagnosis not present

## 2023-06-12 DIAGNOSIS — Z0000 Encounter for general adult medical examination without abnormal findings: Secondary | ICD-10-CM | POA: Diagnosis not present

## 2023-06-12 DIAGNOSIS — S41101A Unspecified open wound of right upper arm, initial encounter: Secondary | ICD-10-CM

## 2023-06-12 DIAGNOSIS — E1159 Type 2 diabetes mellitus with other circulatory complications: Secondary | ICD-10-CM | POA: Diagnosis not present

## 2023-06-12 DIAGNOSIS — Z13820 Encounter for screening for osteoporosis: Secondary | ICD-10-CM

## 2023-06-12 MED ORDER — SEMAGLUTIDE (2 MG/DOSE) 8 MG/3ML ~~LOC~~ SOPN: 2.0000 mg | PEN_INJECTOR | SUBCUTANEOUS | 2 refills | Status: DC

## 2023-06-12 MED ORDER — DULOXETINE HCL 20 MG PO CPEP: 20.0000 mg | ORAL_CAPSULE | Freq: Every day | ORAL | 3 refills | Status: DC

## 2023-06-12 NOTE — Telephone Encounter (Signed)
-----   Message from Dorcas Carrow, [place] sent at 06/12/2023 11:15 AM EDT ----- Eye exam from Dr. Alvester Morin please

## 2023-06-12 NOTE — Progress Notes (Signed)
BP 120/64   Pulse 71   Temp 98.2 F (36.8 C) (Oral)   Ht 5' 0.5" (1.537 m)   Wt 191 lb 3.2 oz (86.7 kg)   SpO2 94%   BMI 36.73 kg/m    Subjective:    Patient ID: Kelsey Mcdowell, female    DOB: 03-30-1946, 77 y.o.   MRN: [id number]  HPI: Kelsey Mcdowell is a 77 y.o. female presenting on 06/12/2023 for comprehensive medical examination. Current medical complaints include:  DIABETES- only started on her 1mg  on Monday, was only taking 0.25 weekly. She notes that her diarrhea is gone.  Hypoglycemic episodes:no Polydipsia/polyuria: yes Visual disturbance: no Chest pain: no Paresthesias: yes Glucose Monitoring: yes  Accucheck frequency: BID  Fasting glucose: 80s-90  Evening: 200s Taking Insulin?: yes  Long acting insulin: 20 units  Short acting insulin: couple of times Blood Pressure Monitoring: not checking Retinal Examination: Up to Date Foot Exam: Up to Date Diabetic Education: Completed Pneumovax: Not up to Date Influenza: Up to Date Aspirin: no  Menopausal Symptoms: no  Functional Status Survey: Is the patient deaf or have difficulty hearing?: Yes Does the patient have difficulty seeing, even when wearing glasses/contacts?: No Does the patient have difficulty concentrating, remembering, or making decisions?: Yes Does the patient have difficulty walking or climbing stairs?: Yes Does the patient have difficulty dressing or bathing?: No Does the patient have difficulty doing errands alone such as visiting a doctor's office or shopping?: No     05/08/2023   10:45 AM 04/30/2023    2:05 PM 09/05/2022   11:13 AM 08/06/2022   11:45 AM 05/28/2022   11:30 AM  Fall Risk   Falls in the past year? 1 1 0 1 0  Number falls in past yr: 1 1 0 1 0  Injury with Fall? 0 0 0 0 0  Risk for fall due to : History of fall(s) History of fall(s)  Impaired balance/gait No Fall Risks  Follow up Falls evaluation completed Falls evaluation completed Falls evaluation  completed Falls evaluation completed;Falls prevention discussed;Education provided Falls evaluation completed;Education provided;Falls prevention discussed    Depression Screen    06/12/2023   10:58 AM 05/08/2023   10:46 AM 04/30/2023    2:05 PM 08/06/2022   11:46 AM 05/28/2022   11:32 AM  Depression screen PHQ 2/9  Decreased Interest 0 0 0 0 0  Down, Depressed, Hopeless 0 0 0 0 0  PHQ - 2 Score 0 0 0 0 0  Altered sleeping 1 3 2     Tired, decreased energy 1 3 3     Change in appetite 0 0 2    Feeling bad or failure about yourself  0 0 0    Trouble concentrating 1 0 0    Moving slowly or fidgety/restless 0 2 2    Suicidal thoughts 0 1 0    PHQ-9 Score 3 9 9     Difficult doing work/chores Not difficult at all Somewhat difficult Not difficult at all     Advanced Directives Does patient have a HCPOA?    yes If yes, name and contact information:  Does patient have a living will or MOST form?  yes  Past Medical History:  Past Medical History:  Diagnosis Date   Arthritis    CHF (congestive heart failure) (HCC)    COPD (chronic obstructive pulmonary disease) (HCC)    Diabetes mellitus without complication (HCC)    GERD (gastroesophageal reflux disease)    Hyperlipemia  Hypertension    Myocardial infarction (HCC)    light heart attack ?when   Neuropathy    Sleep apnea     Surgical History:  Past Surgical History:  Procedure Laterality Date   ABDOMINAL HYSTERECTOMY     APPENDECTOMY     BREAST EXCISIONAL BIOPSY Left 1970's   benign   CHOLECYSTECTOMY     COLONOSCOPY WITH PROPOFOL N/A 09/30/2018   Procedure: COLONOSCOPY WITH PROPOFOL;  Surgeon: Toledo, Boykin Nearing, MD;  Location: [facility];  Service: Gastroenterology;  Laterality: N/A;   COLONOSCOPY WITH PROPOFOL N/A 07/09/2022   Procedure: COLONOSCOPY WITH PROPOFOL;  Surgeon: Regis Bill, MD;  Location: [facility] ENDOSCOPY;  Service: Endoscopy;  Laterality: N/A;  IDDM   COLONOSCOPY WITH PROPOFOL N/A 01/21/2023   Procedure:  COLONOSCOPY WITH PROPOFOL;  Surgeon: Regis Bill, MD;  Location: [facility] END[facility]COPY;  Service: Endoscopy;  Laterality: N/A;   ESOPHAGOGASTRODUODENOSCOPY (EGD) WITH PROPOFOL N/A 07/09/2022   Procedure: ESOPHAGOGASTRODUODENOSCOPY (EGD) WITH PROPOFOL;  Surgeon: Regis Bill, MD;  Location: [facility] END[facility]COPY;  Service: Endoscopy;  Laterality: N/A;   SPINE SURGERY  1998   cervical fusion    Medications:  Current Outpatient Medications on File Prior to Visit  Medication Sig   acetaminophen (TYLENOL) 500 MG tablet Take 500 mg by mouth every 6 (six) hours as needed (back pain).   dapagliflozin propanediol (FARXIGA) 10 MG TABS tablet Take 1 tablet (10 mg total) by mouth daily before breakfast.   fluticasone (FLONASE) 50 MCG/ACT nasal spray 2 sprays into each nostril daily.   gabapentin (NEURONTIN) 300 MG capsule Start taking 1 cap BID for 3-4 days, then 1 cap in the AM and 2 caps in the PM for 3-4 days, then 2 caps BID for 3-4 days, then 2 caps in the AM and 3 caps in the PM   insulin glargine (LANTUS) 100 UNIT/ML Solostar Pen Inject 20 Units into the skin daily.   Insulin Pen Needle 34G X 3.5 MM MISC 1 Dose by Does not apply route daily.   metoprolol succinate (TOPROL-XL) 25 MG 24 hr tablet Take 25 mg by mouth daily.   Multiple Vitamin (MULTIVITAMIN WITH MINERALS) TABS tablet Take 1 tablet by mouth daily.   nortriptyline (PAMELOR) 25 MG capsule Take 50 mg by mouth daily.   omeprazole (PRILOSEC) 20 MG capsule Take 20 mg by mouth daily.   ONETOUCH VERIO test strip 1 each 3 (three) times daily.   rosuvastatin (CRESTOR) 10 MG tablet Take 10 mg by mouth at bedtime.   sacubitril-valsartan (ENTRESTO) 24-26 MG Take 1 tablet by mouth 2 (two) times daily.   Secukinumab (COSENTYX [place]) Inject into the skin every 30 (thirty) days. Last date give was the week of 11/25/22. Pt unsure of exact date.   Semaglutide, 1 MG/DOSE, 4 MG/3ML SOPN Inject 1 mg as directed once a week. (Patient taking differently: Inject 1  mg as directed once a week. Mondays)   spironolactone (ALDACTONE) 25 MG tablet Take 1/2 (one-half) tablet by mouth once daily   triamcinolone ointment (KENALOG) 0.5 % Apply 1 Application topically daily as needed.   vitamin B-12 (CYANOCOBALAMIN) 1000 MCG tablet Take 1,000 mcg by mouth daily.   furosemide (LASIX) 20 MG tablet Take 1 tablet (20 mg total) by mouth daily. (Patient not taking: Reported on 04/30/2023)   insulin aspart (NOVOLOG) 100 UNIT/ML injection Inject 7 Units into the skin 3 (three) times daily with meals. As needed (Patient not taking: Reported on 06/12/2023)   No current facility-administered medications on file  prior to visit.    Allergies:  Allergies  Allergen Reactions   Amoxicillin Other (See Comments)    Other Reaction: GI upset, cramps   Glipizide Other (See Comments)    Social History:  Social History   Socioeconomic History   Marital status: Widowed    Spouse name: Not on file   Number of children: Not on file   Years of education: Not on file   Highest education level: Not on file  Occupational History   Not on file  Tobacco Use   Smoking status: Every Day    Types: Cigarettes    Last attempt to quit: 12/31/2022    Years since quitting: 0.4   Smokeless tobacco: Never  Vaping Use   Vaping Use: Never used  Substance and Sexual Activity   Alcohol use: Yes    Comment: occasional   Drug use: No   Sexual activity: Not on file  Other Topics Concern   Not on file  Social History Narrative   Not on file   Social Determinants of Health   Financial Resource Strain: Not on file  Food Insecurity: Not on file  Transportation Needs: Not on file  Physical Activity: Not on file  Stress: Not on file  Social Connections: Not on file  Intimate Partner Violence: Not on file   Social History   Tobacco Use  Smoking Status Every Day   Types: Cigarettes   Last attempt to quit: 12/31/2022   Years since quitting: 0.4  Smokeless Tobacco Never   Social History    Substance and Sexual Activity  Alcohol Use Yes   Comment: occasional    Family History:  Family History  Problem Relation Age of Onset   Cancer Mother    Hypertension Mother    Diabetes Mother    Heart disease Father    Diabetes Father    Cancer Sister    Breast cancer Sister 63   Heart disease Brother     Past medical history, surgical history, medications, allergies, family history and social history reviewed with patient today and changes made to appropriate areas of the chart.   Review of Systems  Constitutional: Negative.   HENT: Negative.    Respiratory: Negative.    Cardiovascular: Negative.   Genitourinary: Negative.   Musculoskeletal: Negative.   Skin: Negative.   Neurological: Negative.   Psychiatric/Behavioral: Negative.      All other ROS negative except what is listed above and in the HPI.      Objective:    BP 120/64   Pulse 71   Temp 98.2 F (36.8 C) (Oral)   Ht 5' 0.5" (1.537 m)   Wt 191 lb 3.2 oz (86.7 kg)   SpO2 94%   BMI 36.73 kg/m   Wt Readings from Last 3 Encounters:  06/12/23 191 lb 3.2 oz (86.7 kg)  06/04/23 190 lb 9.6 oz (86.5 kg)  05/09/23 193 lb 12.8 oz (87.9 kg)     Physical Exam Vitals and nursing note reviewed.  Constitutional:      General: She is not in acute distress.    Appearance: Normal appearance. She is obese. She is not ill-appearing, toxic-appearing or diaphoretic.  HENT:     Head: Normocephalic and atraumatic.     Right Ear: External ear normal.     Left Ear: External ear normal.     Nose: Nose normal.     Mouth/Throat:     Mouth: Mucous membranes are moist.     Pharynx:  Oropharynx is clear.  Eyes:     General: No scleral icterus.       Right eye: No discharge.        Left eye: No discharge.     Extraocular Movements: Extraocular movements intact.     Conjunctiva/sclera: Conjunctivae normal.     Pupils: Pupils are equal, round, and reactive to light.  Cardiovascular:     Rate and Rhythm: Normal rate  and regular rhythm.     Pulses: Normal pulses.     Heart sounds: Normal heart sounds. No murmur heard.    No friction rub. No gallop.  Pulmonary:     Effort: Pulmonary effort is normal. No respiratory distress.     Breath sounds: Normal breath sounds. No stridor. No wheezing, rhonchi or rales.  Chest:     Chest wall: No tenderness.  Musculoskeletal:        General: Normal range of motion.     Cervical back: Normal range of motion and neck supple.  Skin:    General: Skin is warm and dry.     Capillary Refill: Capillary refill takes less than 2 seconds.     Coloration: Skin is not jaundiced or pale.     Findings: No bruising, erythema, lesion or rash.  Neurological:     General: No focal deficit present.     Mental Status: She is alert and oriented to person, place, and time. Mental status is at baseline.  Psychiatric:        Mood and Affect: Mood normal.        Behavior: Behavior normal.        Thought Content: Thought content normal.        Judgment: Judgment normal.        06/12/2023   11:18 AM  6CIT Screen  What Year? 0 points  What month? 0 points  What time? 0 points  Count back from 20 4 points  Months in reverse 0 points  Repeat phrase 2 points  Total Score 6 points   Results for orders placed or performed in visit on 06/12/23  Microalbumin, urine  Result Value Ref Range   Microalb, Ur 0.4   Microalbumin / creatinine urine ratio  Result Value Ref Range   Microalb Creat Ratio 9   Protein / creatinine ratio, urine  Result Value Ref Range   Creatinine, Urine 43   Hemoglobin A1c  Result Value Ref Range   Hemoglobin A1C 7.9       Assessment & Plan:   Problem List Items Addressed This Visit       Cardiovascular and Mediastinum   Type 2 diabetes mellitus with cardiac complication (HCC)    Sugars doing well on ozempic and lower dose of insulin. Just started on 1mg  ozempic. No more diarrhea. Will do another 3 weeks on the 1mg  ozempic and 20 units lantus, then  increase to 2mg  ozempic and stop the lantus. Meal time insulin still as needed. Recheck 3 weeks. Call with any concerns.       Relevant Medications   Semaglutide, 2 MG/DOSE, 8 MG/3ML SOPN     Nervous and Auditory   Neuropathy    Stable. Better on her higher dose of gabapentin. Will change her sertraline to cymbalta. Recheck 6 weeks. Call with any concerns.         Other   Depression, major, single episode, moderate (HCC)    Doing well. Will change her from sertraline to cymbalta to help with pain. Call  with any concerns.       Relevant Medications   DULoxetine (CYMBALTA) 20 MG capsule   Advanced directives, counseling/discussion    A voluntary discussion about advance care planning including the explanation and discussion of advance directives was extensively discussed  with the patient for 5 minutes with patient present.  Explanation about the health care proxy and Living will was reviewed and packet with forms with explanation of how to fill them out was given.  During this discussion, the patient was able to identify a health care proxy as her niece Lawson Fiscal and plansto bring out the paperwork required.  Patient was offered a separate Advance Care Planning visit for further assistance with forms.         Other Visit Diagnoses     Encounter for annual wellness exam in Medicare patient    -  Primary   Preventative care discussed today as below.   Routine general medical examination at a health care facility       Vaccines updated. Screening labs checked today. Mammo and colonoscopy N/A. DEXA ordered today. Continue diet and exercise. Call with any concerns.   Open wound of right upper arm, initial encounter       Consistent with bug bite. Healing well. Due for Td. Given today.   Relevant Orders   Td : Tetanus/diphtheria >7yo Preservative  free (Completed)   Screening for osteoporosis       DEXA ordered today.   Relevant Orders   DG Bone Density   Need for vaccination against  Streptococcus pneumoniae       Prevnar given. Call with any concerns.   Relevant Orders   Pneumococcal conjugate vaccine 20-valent (Prevnar 20) (Completed)        Preventative Services:  Health Risk Assessment and Personalized Prevention Plan: Done today Bone Mass Measurements: Ordered today Breast Cancer Screening: N/A CVD Screening: up to date Cervical Cancer Screening: N/A Colon Cancer Screening: N/a Depression Screening: Done today Diabetes Screening: Due in August Glaucoma Screening: See your eye doctor Hepatitis B vaccine: N/A Hepatitis C screening: up to date HIV Screening: up to date Flu Vaccine: Get in the fall Lung cancer Screening: N/A Obesity Screening: Done today Pneumonia Vaccines: up to date STI Screening: N/A  Follow up plan: Return in about 6 weeks (around 07/24/2023).   LABORATORY TESTING:  - Pap smear: not applicable  IMMUNIZATIONS:   - Tdap: Tetanus vaccination status reviewed: Td vaccination indicated and given today. - Influenza: Postponed to flu season - Prevnar: Administered today - Zostavax vaccine: Given elsewhere  SCREENING: -Mammogram: Up to date  - Colonoscopy: Not applicable  - Bone Density: Up to date   PATIENT COUNSELING:   Advised to take 1 mg of folate supplement per day if capable of pregnancy.   Sexuality: Discussed sexually transmitted diseases, partner selection, use of condoms, avoidance of unintended pregnancy  and contraceptive alternatives.   Advised to avoid cigarette smoking.  I discussed with the patient that most people either abstain from alcohol or drink within safe limits (<=14/week and <=4 drinks/occasion for males, <=7/weeks and <= 3 drinks/occasion for females) and that the risk for alcohol disorders and other health effects rises proportionally with the number of drinks per week and how often a drinker exceeds daily limits.  Discussed cessation/primary prevention of drug use and availability of treatment for  abuse.   Diet: Encouraged to adjust caloric intake to maintain  or achieve ideal body weight, to reduce intake of dietary saturated fat and  total fat, to limit sodium intake by avoiding high sodium foods and not adding table salt, and to maintain adequate dietary potassium and calcium preferably from fresh fruits, vegetables, and low-fat dairy products.    stressed the importance of regular exercise  Injury prevention: Discussed safety belts, safety helmets, smoke detector, smoking near bedding or upholstery.   Dental health: Discussed importance of regular tooth brushing, flossing, and dental visits.    NEXT PREVENTATIVE PHYSICAL DUE IN 1 YEAR. Return in about 6 weeks (around 07/24/2023).

## 2023-06-12 NOTE — Progress Notes (Deleted)
   BP 120/64   Pulse 71   Temp 98.2 F (36.8 C) (Oral)   Ht 5' 0.5" (1.537 m)   Wt 191 lb 3.2 oz (86.7 kg)   SpO2 94%   BMI 36.73 kg/m    Subjective:    Patient ID: Kelsey Mcdowell, female    DOB: Jun 10, 1946, 77 y.o.   MRN: [id number]  HPI: Kelsey Mcdowell is a 77 y.o. female  Chief Complaint  Patient presents with   Diabetes     Relevant past medical, surgical, family and social history reviewed and updated as indicated. Interim medical history since our last visit reviewed. Allergies and medications reviewed and updated.  Review of Systems  Per HPI unless specifically indicated above     Objective:    BP 120/64   Pulse 71   Temp 98.2 F (36.8 C) (Oral)   Ht 5' 0.5" (1.537 m)   Wt 191 lb 3.2 oz (86.7 kg)   SpO2 94%   BMI 36.73 kg/m   Wt Readings from Last 3 Encounters:  06/12/23 191 lb 3.2 oz (86.7 kg)  06/04/23 190 lb 9.6 oz (86.5 kg)  05/09/23 193 lb 12.8 oz (87.9 kg)    Physical Exam  Results for orders placed or performed in visit on 05/08/23  Basic metabolic panel  Result Value Ref Range   Glucose 144 (H) 70 - 99 mg/dL   BUN 13 8 - 27 mg/dL   Creatinine, Ser 3.66 (H) 0.57 - 1.00 mg/dL   eGFR 30 (L) >44 IH/KVQ/2.59   BUN/Creatinine Ratio 7 (L) 12 - 28   Sodium 143 134 - 144 mmol/L   Potassium 4.3 3.5 - 5.2 mmol/L   Chloride 105 96 - 106 mmol/L   CO2 23 20 - 29 mmol/L   Calcium 8.9 8.7 - 10.3 mg/dL      Assessment & Plan:   Problem List Items Addressed This Visit   None    Follow up plan: No follow-ups on file.

## 2023-06-12 NOTE — Assessment & Plan Note (Signed)
Sugars doing well on ozempic and lower dose of insulin. Just started on 1mg  ozempic. No more diarrhea. Will do another 3 weeks on the 1mg  ozempic and 20 units lantus, then increase to 2mg  ozempic and stop the lantus. Meal time insulin still as needed. Recheck 3 weeks. Call with any concerns.

## 2023-06-12 NOTE — Telephone Encounter (Signed)
Spoke with [facility] and [facility] to cancel requested medications per Dr [name]. Both pharmacies verbalized understanding and have discontinue both prescriptions.

## 2023-06-12 NOTE — Telephone Encounter (Signed)
-----   Message from Dorcas Carrow, DO sent at 06/12/2023 11:11 AM EDT ----- [facility] and [facility]  [facility]empic at [facility]

## 2023-06-12 NOTE — Patient Instructions (Addendum)
Take 20 units of the lantus while you finish your 1mg  ozempic STOP the lantus when you go up to 2mg  on the ozempic Finish your sertraline, then change to cymbalta  Preventative Services:  Health Risk Assessment and Personalized Prevention Plan: Done today Bone Mass Measurements: Ordered today Breast Cancer Screening: N/A CVD Screening: up to date Cervical Cancer Screening: N/A Colon Cancer Screening: N/a Depression Screening: Done today Diabetes Screening: Due in August Glaucoma Screening: See your eye doctor Hepatitis B vaccine: N/A Hepatitis C screening: up to date HIV Screening: up to date Flu Vaccine: Get in the fall Lung cancer Screening: N/A Obesity Screening: Done today Pneumonia Vaccines: up to date STI Screening: N/A

## 2023-06-12 NOTE — Assessment & Plan Note (Signed)
Stable. Better on her higher dose of gabapentin. Will change her sertraline to cymbalta. Recheck 6 weeks. Call with any concerns.

## 2023-06-12 NOTE — Assessment & Plan Note (Signed)
A voluntary discussion about advance care planning including the explanation and discussion of advance directives was extensively discussed  with the patient for 5 minutes with patient present.  Explanation about the health care proxy and Living will was reviewed and packet with forms with explanation of how to fill them out was given.  During this discussion, the patient was able to identify a health care proxy as her niece Lawson Fiscal and plansto bring out the paperwork required.  Patient was offered a separate Advance Care Planning visit for further assistance with forms.

## 2023-06-12 NOTE — Assessment & Plan Note (Signed)
Doing well. Will change her from sertraline to cymbalta to help with pain. Call with any concerns.

## 2023-07-08 ENCOUNTER — Other Ambulatory Visit: Payer: Self-pay | Admitting: Family Medicine

## 2023-07-08 DIAGNOSIS — Z1231 Encounter for screening mammogram for malignant neoplasm of breast: Secondary | ICD-10-CM

## 2023-07-23 ENCOUNTER — Telehealth: Payer: Self-pay | Admitting: Family Medicine

## 2023-07-23 NOTE — Telephone Encounter (Signed)
Medication Refill - Medication: metoprolol succinate (TOPROL-XL) 25 MG 24 hr tablet   Has the patient contacted their pharmacy? Yes.   (Agent: If no, request that the patient contact the pharmacy for the refill. If patient does not wish to contact the pharmacy document the reason why and proceed with request.) (Agent: If yes, when and what did the pharmacy advise?)  Preferred Pharmacy (with phone number or street name):  [facility] [address] [place] - [address]  [address] [place] [address]  Phone: [phone] Fax: [phone]   Has the patient been seen for an appointment in the last year OR does the patient have an upcoming appointment? Yes.    Agent: Please be advised that RX refills may take up to 3 business days. We ask that you follow-up with your pharmacy.

## 2023-07-23 NOTE — Telephone Encounter (Signed)
Copied from CRM [id number]. Topic: General - Other >> Jul 23, 2023  8:29 AM Franchot Heidelberg wrote: Reason for CRM: Pt called to report that her Nephrologist has taken her off of Duloxetine

## 2023-07-28 ENCOUNTER — Telehealth: Payer: Self-pay | Admitting: Family Medicine

## 2023-07-28 NOTE — Telephone Encounter (Signed)
Medication Refill - Medication: metoprolol succinate (TOPROL-XL) 25 MG 24 hr tablet [[id number]]   Has the patient contacted their pharmacy? Yes.    (Agent: If yes, when and what did the pharmacy advise?) Contact PCP, no prescription found   Preferred Pharmacy (with phone number or street name): [facility] [address]   Has the patient been seen for an appointment in the last year OR does the patient have an upcoming appointment? Yes.    Agent: Please be advised that RX refills may take up to 3 business days. We ask that you follow-up with your pharmacy.   Pt is completely out of medication

## 2023-07-28 NOTE — Telephone Encounter (Signed)
Please advise 

## 2023-07-31 ENCOUNTER — Other Ambulatory Visit: Payer: Self-pay | Admitting: Family Medicine

## 2023-07-31 NOTE — Telephone Encounter (Signed)
Medication Refill - Medication: metoprolol succinate (TOPROL-XL) 25 MG 24 hr tablet   Has the patient contacted their pharmacy? Yes.    Preferred Pharmacy (with phone number or street name):  [facility] [address], [place] - [address] Phone: [phone]  Fax: [phone]     Has the patient been seen for an appointment in the last year OR does the patient have an upcoming appointment? Yes.    Agent: Please be advised that RX refills may take up to 3 business days. We ask that you follow-up with your pharmacy.

## 2023-08-01 MED ORDER — METOPROLOL SUCCINATE ER 25 MG PO TB24: 25.0000 mg | ORAL_TABLET | Freq: Every day | ORAL | 0 refills | Status: DC

## 2023-08-01 NOTE — Telephone Encounter (Signed)
Requested medications are due for refill today.  yes  Requested medications are on the active medications list.  yes  Last refill. 12/04/2022   Future visit scheduled.   yes  Notes to clinic.  Medication is listed as historical.    Requested Prescriptions  Pending Prescriptions Disp Refills   metoprolol succinate (TOPROL-XL) 25 MG 24 hr tablet      Sig: Take 1 tablet (25 mg total) by mouth daily.     Cardiovascular:  Beta Blockers Passed - 08/01/2023  8:00 AM      Passed - Last BP in normal range    BP Readings from Last 1 Encounters:  06/12/23 120/64         Passed - Last Heart Rate in normal range    Pulse Readings from Last 1 Encounters:  06/12/23 71         Passed - Valid encounter within last 6 months    Recent Outpatient Visits           1 month ago Encounter for annual wellness exam in Medicare patient   [place] [facility] [place], Megan P, DO   2 months ago Controlled type 2 diabetes mellitus without complication, with long-term current use of insulin (HCC)   [place] [facility] [place], Megan P, DO   3 months ago Acute kidney injury superimposed on chronic kidney disease [facility])   [place] [facility] Dorcas Carrow, DO       Future Appointments             In 4 days Dorcas Carrow, DO [facility]

## 2023-08-04 NOTE — Progress Notes (Unsigned)
PCP: Olevia Perches, DO (last seen 06/24) Primary Cardiologist: Leanora Ivanoff PA (last seen 06/24)  HPI:  Kelsey Mcdowell is a 77 yo with a PMHx of COPD, acute on chronic systolic CHF, DM II, tobacco abuse, obesity, and HTN.  Admitted 04/24/23 due to several days of diarrhea, decreased PO intake and worsening weakness. Found to have oliguric AKI. Held furosemide, aldactone, entresto, hydrochlorothiazide, dapagliflozin during hospital stay.   Echo 01/28/22: EF 40-45% LV with mildly decreased function and global hypokinesis with mild LVH.   She presents today for a HF follow-up visit with a chief complaint of minimal SOB with moderate exertion. Has associated fatigue, back pain, intermittent pedal edema, and dizziness at times along with this. Denies chest pain, palpitations, abdominal distention or weight gain. Wearing oxgyen at night.   Currently seeing a chiropractor for back pain. May have to have a MRI if symptoms do not improve.   ROS: All systems negative except as listed in HPI, PMH and Problem List.  SH:  Social History   Socioeconomic History   Marital status: Widowed    Spouse name: Not on file   Number of children: Not on file   Years of education: Not on file   Highest education level: Not on file  Occupational History   Not on file  Tobacco Use   Smoking status: Every Day    Current packs/day: 0.00    Types: Cigarettes    Last attempt to quit: 12/31/2022    Years since quitting: 0.5   Smokeless tobacco: Never  Vaping Use   Vaping status: Never Used  Substance and Sexual Activity   Alcohol use: Yes    Comment: occasional   Drug use: No   Sexual activity: Not on file  Other Topics Concern   Not on file  Social History Narrative   Not on file   Social Determinants of Health   Financial Resource Strain: Low Risk  (04/25/2023)   [facility], [facility]   Overall Financial Resource Strain (CARDIA)    Difficulty of Paying Living Expenses: Not  very hard  Food Insecurity: No Food Insecurity (04/25/2023)   Received from [facility], [facility]   Hunger Vital Sign    Worried About Running Out of Food in the Last Year: Never true    Ran Out of Food in the Last Year: Never true  Transportation Needs: No Transportation Needs (04/25/2023)   Received from [facility], [facility]   [facility] - Transportation    Lack of Transportation (Medical): No    Lack of Transportation (Non-Medical): No  Physical Activity: Not on file  Stress: Not on file  Social Connections: Not on file  Intimate Partner Violence: Not on file    FH:  Family History  Problem Relation Age of Onset   Cancer Mother    Hypertension Mother    Diabetes Mother    Heart disease Father    Diabetes Father    Cancer Sister    Breast cancer Sister 110   Heart disease Brother     Past Medical History:  Diagnosis Date   Arthritis    CHF (congestive heart failure) (HCC)    COPD (chronic obstructive pulmonary disease) (HCC)    Diabetes mellitus without complication (HCC)    GERD (gastroesophageal reflux disease)    Hyperlipemia    Hypertension    Myocardial infarction (HCC)    light heart attack ?when   Neuropathy  Sleep apnea     Current Outpatient Medications  Medication Sig Dispense Refill   acetaminophen (TYLENOL) 500 MG tablet Take 500 mg by mouth every 6 (six) hours as needed (back pain).     dapagliflozin propanediol (FARXIGA) 10 MG TABS tablet Take 1 tablet (10 mg total) by mouth daily before breakfast. 90 tablet 3   DULoxetine (CYMBALTA) 20 MG capsule Take 1 capsule (20 mg total) by mouth daily. 30 capsule 3   fluticasone (FLONASE) 50 MCG/ACT nasal spray 2 sprays into each nostril daily.     furosemide (LASIX) 20 MG tablet Take 1 tablet (20 mg total) by mouth daily. (Patient not taking: Reported on 04/30/2023) 30 tablet 0   gabapentin (NEURONTIN) 300 MG capsule Start taking 1 cap BID for 3-4 days, then 1 cap in the AM and 2 caps in the PM  for 3-4 days, then 2 caps BID for 3-4 days, then 2 caps in the AM and 3 caps in the PM 150 capsule 3   insulin aspart (NOVOLOG) 100 UNIT/ML injection Inject 7 Units into the skin 3 (three) times daily with meals. As needed (Patient not taking: Reported on 06/12/2023)     insulin glargine (LANTUS) 100 UNIT/ML Solostar Pen Inject 20 Units into the skin daily. 15 mL 0   Insulin Pen Needle 34G X 3.5 MM MISC 1 Dose by Does not apply route daily. 100 each 0   metoprolol succinate (TOPROL-XL) 25 MG 24 hr tablet Take 1 tablet (25 mg total) by mouth daily. 180 tablet 0   Multiple Vitamin (MULTIVITAMIN WITH MINERALS) TABS tablet Take 1 tablet by mouth daily.     nortriptyline (PAMELOR) 25 MG capsule Take 50 mg by mouth daily.     omeprazole (PRILOSEC) 20 MG capsule Take 20 mg by mouth daily.     ONETOUCH VERIO test strip 1 each 3 (three) times daily.     rosuvastatin (CRESTOR) 10 MG tablet Take 10 mg by mouth at bedtime.     sacubitril-valsartan (ENTRESTO) 24-26 MG Take 1 tablet by mouth 2 (two) times daily. 60 tablet 3   Secukinumab (COSENTYX [place]) Inject into the skin every 30 (thirty) days. Last date give was the week of 11/25/22. Pt unsure of exact date.     Semaglutide, 1 MG/DOSE, 4 MG/3ML SOPN Inject 1 mg as directed once a week. (Patient taking differently: Inject 1 mg as directed once a week. Mondays) 3 mL 3   Semaglutide, 2 MG/DOSE, 8 MG/3ML SOPN Inject 2 mg as directed once a week. 3 mL 2   spironolactone (ALDACTONE) 25 MG tablet Take 1/2 (one-half) tablet by mouth once daily 15 tablet 5   triamcinolone ointment (KENALOG) 0.5 % Apply 1 Application topically daily as needed.     vitamin B-12 (CYANOCOBALAMIN) 1000 MCG tablet Take 1,000 mcg by mouth daily.     No current facility-administered medications for this visit.   Vitals:   08/05/23 1131  BP: 133/66  Pulse: 95  SpO2: 96%  Weight: 185 lb (83.9 kg)   Wt Readings from Last 3 Encounters:  08/05/23 185 lb (83.9 kg)  08/05/23 185 lb (83.9 kg)   06/12/23 191 lb 3.2 oz (86.7 kg)   Lab Results  Component Value Date   CREATININE 1.74 (H) 05/08/2023   CREATININE 1.69 (H) 01/16/2023   CREATININE 1.47 (H) 12/04/2022   PHYSICAL EXAM:  General:  Well appearing. No resp difficulty HEENT: normal Neck: supple. JVP flat. No lymphadenopathy or thryomegaly appreciated. Cor: PMI normal. Regular  rate & rhythm. No rubs, gallops or murmurs. Lungs: clear Abdomen: soft, nontender, nondistended. No hepatosplenomegaly. No bruits or masses. Extremities: no cyanosis, clubbing, rash. 1+ edema bilateral lower legs Neuro: alert & oriented x3, cranial nerves grossly intact. Moves all 4 extremities w/o difficulty. Affect pleasant.   ECG: not done   ASSESSMENT & PLAN:  NICM with mildly reduced ejection fraction- - likely due to to HTN/ COPD - NYHA class II - euvolemic - weighing most days; reviewed the importance of weighing daily and to call for an overnight weight gain of > 2 pounds or a weekly weight gain of > 5 pounds - weight down 5 pounds from last visit here 2 months ago - echo 01/28/22: EF 40-45% LV with mildly decreased function and global hypokinesis with mild LVH - have scheduled echo for 08/20/23 - reviewed keeping daily fluid intake to 60-64 ounces - reviewed keeping daily sodium intake to 2000mg   - continue entresto 24/26mg  BID - continue farxiga 10mg  daily - continue metoprolol succinate 25mg  daily - continue spironolactone 12.5mg  daily - continue furosemide 20mg  daily - emphasized, again, to get compression socks and put them on every morning with removal at bedtime - elevate legs when sitting for long periods of time - saw cardiology Mellissa Kohut) 06/24 - encouraged as much activity as possible - BNP 01/27/22 721.9  Hypertension- - BP 133/66 - saw PCP [name]) 06/24 - BMP 07/16/23 reviewed and showed sodium 143, potassium 4.3, creatinine 1.52 and GFR 35  COPD- - saw pulmonologist Meredeth Ide) 06/24 - smoking 1 pack every 2-3  days; does not smoke with oxygen on - continued cessation discussed for 3 minutes - on O2 @ 2L at bedtime and PRN during the day; says that she doesn't wear it much during the day  DM- - A1c 04/24/23 was 7.9%  - ozempic 2mg  weekly - saw nephrology Cherylann Ratel) 07/24  Return in 2 months, sooner if needed.

## 2023-08-05 ENCOUNTER — Ambulatory Visit: Payer: Medicare Other | Attending: Family | Admitting: Family

## 2023-08-05 ENCOUNTER — Encounter: Payer: Self-pay | Admitting: Family Medicine

## 2023-08-05 ENCOUNTER — Ambulatory Visit (INDEPENDENT_AMBULATORY_CARE_PROVIDER_SITE_OTHER): Payer: Medicare Other | Admitting: Family Medicine

## 2023-08-05 ENCOUNTER — Encounter: Payer: Self-pay | Admitting: Family

## 2023-08-05 VITALS — BP 125/76 | HR 112 | Temp 97.6°F | Wt 185.0 lb

## 2023-08-05 VITALS — BP 133/66 | HR 95 | Wt 185.0 lb

## 2023-08-05 DIAGNOSIS — I110 Hypertensive heart disease with heart failure: Secondary | ICD-10-CM | POA: Insufficient documentation

## 2023-08-05 DIAGNOSIS — N1831 Chronic kidney disease, stage 3a: Secondary | ICD-10-CM

## 2023-08-05 DIAGNOSIS — F17210 Nicotine dependence, cigarettes, uncomplicated: Secondary | ICD-10-CM | POA: Insufficient documentation

## 2023-08-05 DIAGNOSIS — I5022 Chronic systolic (congestive) heart failure: Secondary | ICD-10-CM

## 2023-08-05 DIAGNOSIS — J449 Chronic obstructive pulmonary disease, unspecified: Secondary | ICD-10-CM | POA: Diagnosis not present

## 2023-08-05 DIAGNOSIS — E1159 Type 2 diabetes mellitus with other circulatory complications: Secondary | ICD-10-CM | POA: Diagnosis not present

## 2023-08-05 DIAGNOSIS — I10 Essential (primary) hypertension: Secondary | ICD-10-CM | POA: Diagnosis not present

## 2023-08-05 DIAGNOSIS — Z7985 Long-term (current) use of injectable non-insulin antidiabetic drugs: Secondary | ICD-10-CM

## 2023-08-05 DIAGNOSIS — E119 Type 2 diabetes mellitus without complications: Secondary | ICD-10-CM | POA: Diagnosis not present

## 2023-08-05 DIAGNOSIS — E1122 Type 2 diabetes mellitus with diabetic chronic kidney disease: Secondary | ICD-10-CM | POA: Diagnosis not present

## 2023-08-05 DIAGNOSIS — Z794 Long term (current) use of insulin: Secondary | ICD-10-CM

## 2023-08-05 DIAGNOSIS — I428 Other cardiomyopathies: Secondary | ICD-10-CM | POA: Insufficient documentation

## 2023-08-05 DIAGNOSIS — F321 Major depressive disorder, single episode, moderate: Secondary | ICD-10-CM | POA: Diagnosis not present

## 2023-08-05 LAB — BAYER DCA HB A1C WAIVED: HB A1C (BAYER DCA - WAIVED): 8.3 % — ABNORMAL HIGH (ref 4.8–5.6)

## 2023-08-05 NOTE — Assessment & Plan Note (Signed)
Stopped her cymbalat by mistake. Will restart. Recheck 4 weeks.

## 2023-08-05 NOTE — Progress Notes (Signed)
BP 125/76   Pulse (!) 112   Temp 97.6 F (36.4 C) (Oral)   Wt 185 lb (83.9 kg)   SpO2 98%   BMI 35.54 kg/m    Subjective:    Patient ID: Kelsey Mcdowell, female    DOB: 01/20/1946, 77 y.o.   MRN: [id number]  HPI: Kelsey Mcdowell is a 77 y.o. female  Chief Complaint  Patient presents with   Depression   Diabetes    Pt states hasn't had a eye exam this year   DIABETES Hypoglycemic episodes:no Polydipsia/polyuria: no Visual disturbance: no Chest pain: no Paresthesias: yes Glucose Monitoring: yes  Accucheck frequency: Not Checking  Fasting glucose: 89 Taking Insulin?: yes  Long acting insulin: 20 units Blood Pressure Monitoring: not checking Retinal Examination: Up to Date- [facility] [place] Foot Exam: Up to Date Diabetic Education: Completed Pneumovax: Up to Date Influenza: Not up to Date Aspirin: no  DEPRESSION- got confused, stopped her cymbalta instead of her meloxicam Mood status: uncontrolled Satisfied with current treatment?: no Symptom severity: moderate  Duration of current treatment :  has been off her medicine for about 3 weeks Side effects: no Medication compliance: poor compliance Psychotherapy/counseling: no  Previous psychiatric medications: sertraline, cymbalta Depressed mood: yes Anxious mood: yes Anhedonia: no Significant weight loss or gain: no Insomnia: no  Fatigue: yes Feelings of worthlessness or guilt: no Impaired concentration/indecisiveness: no Suicidal ideations: no Hopelessness: no Crying spells: no    08/05/2023   10:17 AM 06/12/2023   10:58 AM 05/08/2023   10:46 AM 04/30/2023    2:05 PM 08/06/2022   11:46 AM  Depression screen PHQ 2/9  Decreased Interest 1 0 0 0 0  Down, Depressed, Hopeless 0 0 0 0 0  PHQ - 2 Score 1 0 0 0 0  Altered sleeping 1 1 3 2    Tired, decreased energy 1 1 3 3    Change in appetite 0 0 0 2   Feeling bad or failure about yourself  1 0 0 0   Trouble concentrating  0 1 0 0   Moving slowly or fidgety/restless 0 0 2 2   Suicidal thoughts 0 0 1 0   PHQ-9 Score 4 3 9 9    Difficult doing work/chores Not difficult at all Not difficult at all Somewhat difficult Not difficult at all      Relevant past medical, surgical, family and social history reviewed and updated as indicated. Interim medical history since our last visit reviewed. Allergies and medications reviewed and updated.  Review of Systems  Constitutional: Negative.   Respiratory: Negative.    Cardiovascular: Negative.   Gastrointestinal: Negative.   Musculoskeletal:  Positive for back pain and myalgias. Negative for arthralgias, gait problem, joint swelling, neck pain and neck stiffness.  Psychiatric/Behavioral:  Positive for dysphoric mood. Negative for agitation, behavioral problems, confusion, decreased concentration, hallucinations, self-injury, sleep disturbance and suicidal ideas. The patient is not nervous/anxious and is not hyperactive.     Per HPI unless specifically indicated above     Objective:    BP 125/76   Pulse (!) 112   Temp 97.6 F (36.4 C) (Oral)   Wt 185 lb (83.9 kg)   SpO2 98%   BMI 35.54 kg/m   Wt Readings from Last 3 Encounters:  08/05/23 185 lb (83.9 kg)  06/12/23 191 lb 3.2 oz (86.7 kg)  06/04/23 190 lb 9.6 oz (86.5 kg)    Physical Exam Vitals and nursing note reviewed.  Constitutional:  General: She is not in acute distress.    Appearance: Normal appearance. She is obese. She is not ill-appearing, toxic-appearing or diaphoretic.  HENT:     Head: Normocephalic and atraumatic.     Right Ear: External ear normal.     Left Ear: External ear normal.     Nose: Nose normal.     Mouth/Throat:     Mouth: Mucous membranes are moist.     Pharynx: Oropharynx is clear.  Eyes:     General: No scleral icterus.       Right eye: No discharge.        Left eye: No discharge.     Extraocular Movements: Extraocular movements intact.     Conjunctiva/sclera:  Conjunctivae normal.     Pupils: Pupils are equal, round, and reactive to light.  Cardiovascular:     Rate and Rhythm: Normal rate and regular rhythm.     Pulses: Normal pulses.     Heart sounds: Normal heart sounds. No murmur heard.    No friction rub. No gallop.  Pulmonary:     Effort: Pulmonary effort is normal. No respiratory distress.     Breath sounds: Normal breath sounds. No stridor. No wheezing, rhonchi or rales.  Chest:     Chest wall: No tenderness.  Musculoskeletal:        General: Normal range of motion.     Cervical back: Normal range of motion and neck supple.  Skin:    General: Skin is warm and dry.     Capillary Refill: Capillary refill takes less than 2 seconds.     Coloration: Skin is not jaundiced or pale.     Findings: No bruising, erythema, lesion or rash.  Neurological:     General: No focal deficit present.     Mental Status: She is alert and oriented to person, place, and time. Mental status is at baseline.  Psychiatric:        Mood and Affect: Mood normal.        Behavior: Behavior normal.        Thought Content: Thought content normal.        Judgment: Judgment normal.     Results for orders placed or performed in visit on 06/12/23  Microalbumin, urine  Result Value Ref Range   Microalb, Ur 0.4   Microalbumin / creatinine urine ratio  Result Value Ref Range   Microalb Creat Ratio 9   Protein / creatinine ratio, urine  Result Value Ref Range   Creatinine, Urine 43   Hemoglobin A1c  Result Value Ref Range   Hemoglobin A1C 7.9       Assessment & Plan:   Problem List Items Addressed This Visit       Cardiovascular and Mediastinum   Type 2 diabetes mellitus with cardiac complication (HCC) - Primary    A1c up to 8.3. Continue ozempic 2 mg. Continue lantus 20units. Recheck in about a month.       Relevant Orders   Bayer DCA Hb A1c Waived   CBC with Differential/Platelet   Comprehensive metabolic panel   Lipid Panel w/o Chol/HDL Ratio      Other   Depression, major, single episode, moderate (HCC)    Stopped her cymbalat by mistake. Will restart. Recheck 4 weeks.         Follow up plan: Return in about 4 weeks (around 09/02/2023).

## 2023-08-05 NOTE — Patient Instructions (Signed)
Make sure you don't take anymore meloxicam (mobic)

## 2023-08-05 NOTE — Assessment & Plan Note (Signed)
A1c up to 8.3. Continue ozempic 2 mg. Continue lantus 20units. Recheck in about a month.

## 2023-08-13 ENCOUNTER — Encounter: Payer: Self-pay | Admitting: Family Medicine

## 2023-08-20 ENCOUNTER — Telehealth: Payer: Self-pay

## 2023-08-20 ENCOUNTER — Ambulatory Visit: Admission: RE | Admit: 2023-08-20 | Payer: Medicare Other | Source: Ambulatory Visit

## 2023-08-20 DIAGNOSIS — R0602 Shortness of breath: Secondary | ICD-10-CM | POA: Insufficient documentation

## 2023-08-20 DIAGNOSIS — E119 Type 2 diabetes mellitus without complications: Secondary | ICD-10-CM | POA: Diagnosis not present

## 2023-08-20 DIAGNOSIS — F17210 Nicotine dependence, cigarettes, uncomplicated: Secondary | ICD-10-CM | POA: Insufficient documentation

## 2023-08-20 DIAGNOSIS — J449 Chronic obstructive pulmonary disease, unspecified: Secondary | ICD-10-CM | POA: Insufficient documentation

## 2023-08-20 DIAGNOSIS — I110 Hypertensive heart disease with heart failure: Secondary | ICD-10-CM | POA: Diagnosis not present

## 2023-08-20 DIAGNOSIS — G4730 Sleep apnea, unspecified: Secondary | ICD-10-CM | POA: Insufficient documentation

## 2023-08-20 DIAGNOSIS — I5022 Chronic systolic (congestive) heart failure: Secondary | ICD-10-CM | POA: Diagnosis not present

## 2023-08-20 LAB — ECHOCARDIOGRAM COMPLETE
AR max vel: 2.38 cm2
AV Area VTI: 2.54 cm2
AV Area mean vel: 2.54 cm2
AV Mean grad: 5 mmHg
AV Peak grad: 11 mmHg
Ao pk vel: 1.66 m/s
Area-P 1/2: 2.53 cm2
Calc EF: 61.7 %
MV VTI: 3.56 cm2
S' Lateral: 2.6 cm
Single Plane A2C EF: 58.7 %
Single Plane A4C EF: 64.3 %

## 2023-08-20 NOTE — Telephone Encounter (Signed)
Spoke with pt to remind her of her ECHO today. Pt aware, agreeable, and verbalized understanding

## 2023-08-20 NOTE — Progress Notes (Signed)
*  PRELIMINARY RESULTS* Echocardiogram 2D Echocardiogram has been performed.  Kelsey Mcdowell 08/20/2023, 11:02 AM

## 2023-08-21 ENCOUNTER — Other Ambulatory Visit: Payer: Self-pay

## 2023-08-21 DIAGNOSIS — Z87891 Personal history of nicotine dependence: Secondary | ICD-10-CM

## 2023-08-21 DIAGNOSIS — F17210 Nicotine dependence, cigarettes, uncomplicated: Secondary | ICD-10-CM

## 2023-08-21 DIAGNOSIS — Z122 Encounter for screening for malignant neoplasm of respiratory organs: Secondary | ICD-10-CM

## 2023-09-15 ENCOUNTER — Ambulatory Visit (INDEPENDENT_AMBULATORY_CARE_PROVIDER_SITE_OTHER): Payer: Medicare Other | Admitting: Family Medicine

## 2023-09-15 ENCOUNTER — Encounter: Payer: Self-pay | Admitting: Family Medicine

## 2023-09-15 VITALS — BP 129/72 | HR 84 | Ht 60.5 in | Wt 186.0 lb

## 2023-09-15 DIAGNOSIS — F321 Major depressive disorder, single episode, moderate: Secondary | ICD-10-CM | POA: Diagnosis not present

## 2023-09-15 DIAGNOSIS — K529 Noninfective gastroenteritis and colitis, unspecified: Secondary | ICD-10-CM | POA: Diagnosis not present

## 2023-09-15 DIAGNOSIS — E1159 Type 2 diabetes mellitus with other circulatory complications: Secondary | ICD-10-CM

## 2023-09-15 DIAGNOSIS — Z794 Long term (current) use of insulin: Secondary | ICD-10-CM

## 2023-09-15 DIAGNOSIS — Z23 Encounter for immunization: Secondary | ICD-10-CM | POA: Diagnosis not present

## 2023-09-15 MED ORDER — OZEMPIC (0.25 OR 0.5 MG/DOSE) 2 MG/3ML ~~LOC~~ SOPN: PEN_INJECTOR | SUBCUTANEOUS | Status: DC

## 2023-09-15 MED ORDER — FLUTICASONE PROPIONATE 50 MCG/ACT NA SUSP: 2.0000 | Freq: Every day | NASAL | 12 refills | Status: DC

## 2023-09-15 NOTE — Progress Notes (Signed)
BP 129/72   Pulse 84   Ht 5' 0.5" (1.537 m)   Wt 186 lb (84.4 kg)   SpO2 97%   BMI 35.73 kg/m    Subjective:    Patient ID: Kelsey Mcdowell, female    DOB: November 25, 1946, 77 y.o.   MRN: [id number]  HPI: Kelsey Mcdowell is a 77 y.o. female  Chief Complaint  Patient presents with   Depression   Diabetes    Patient says she has been unable to fill her prescription for Ozempic, due to her being in the donut hole. Patient says she has been without her medication for about three weeks.    Diarrhea    Patient says she has had an ongoing issue with Diarrhea and has mentioned to the provider before, but says here recently it has gotten a lot worse. Patient says she has tried over the [employer].    DIABETES- has been off her ozempic for about a month due to cost Hypoglycemic episodes:no Polydipsia/polyuria: no Visual disturbance: no Chest pain: no Paresthesias: no Glucose Monitoring: yes  Accucheck frequency: BID Taking Insulin?: yes- 20 units of lantus Blood Pressure Monitoring: not checking Retinal Examination: Up to Date Foot Exam: Up to Date Diabetic Education: Completed Pneumovax: Up to Date Influenza: Up to Date Aspirin: no  ABDOMINAL ISSUES Duration: 2-3 years Nature: diarrhea Severity: moderate  Radiation: no Frequency: every time she eats, 10-15x day Alleviating factors:nothing Aggravating factors: eating Treatments attempted: pepto Constipation: no Diarrhea: yes Episodes of diarrhea/day: 10-15x a day Mucous in the stool: no Heartburn: no Bloating:yes Flatulence: yes Nausea: no Vomiting: no Melena or hematochezia: no Rash: no Jaundice: no Fever: no Weight loss: no  DEPRESSION Mood status: better Satisfied with current treatment?: yes Symptom severity: mild  Duration of current treatment : months Side effects: no Medication compliance: good compliance Psychotherapy/counseling: no  Previous psychiatric medications:  cymbalta Depressed mood: yes Anxious mood: yes Anhedonia: no Significant weight loss or gain: no Insomnia: no Fatigue: yes Feelings of worthlessness or guilt: no Impaired concentration/indecisiveness: no Suicidal ideations: no Hopelessness: no Crying spells: no    09/15/2023    3:13 PM 08/05/2023   10:17 AM 06/12/2023   10:58 AM 05/08/2023   10:46 AM 04/30/2023    2:05 PM  Depression screen PHQ 2/9  Decreased Interest 0 1 0 0 0  Down, Depressed, Hopeless 1 0 0 0 0  PHQ - 2 Score 1 1 0 0 0  Altered sleeping  1 1 3 2   Tired, decreased energy 2 1 1 3 3   Change in appetite 0 0 0 0 2  Feeling bad or failure about yourself   1 0 0 0  Trouble concentrating 0 0 1 0 0  Moving slowly or fidgety/restless 0 0 0 2 2  Suicidal thoughts 0 0 0 1 0  PHQ-9 Score  4 3 9 9   Difficult doing work/chores Not difficult at all Not difficult at all Not difficult at all Somewhat difficult Not difficult at all      09/15/2023    3:14 PM 08/05/2023   10:17 AM 06/12/2023   10:59 AM 05/08/2023   10:47 AM  GAD 7 : Generalized Anxiety Score  Nervous, Anxious, on Edge 2 1 0 2  Control/stop worrying 3 1 1  0  Worry too much - different things 3 2 1 3   Trouble relaxing 0 1 2 0  Restless 1 1 1  0  Easily annoyed or irritable 2 1 1  1  Afraid - awful might happen 0 1 0 0  Total GAD 7 Score 11 8 6 6   Anxiety Difficulty Somewhat difficult Somewhat difficult Somewhat difficult Somewhat difficult     Relevant past medical, surgical, family and social history reviewed and updated as indicated. Interim medical history since our last visit reviewed. Allergies and medications reviewed and updated.  Review of Systems  Constitutional: Negative.   Respiratory: Negative.    Cardiovascular: Negative.   Gastrointestinal:  Positive for diarrhea. Negative for abdominal distention, abdominal pain, anal bleeding, blood in stool, constipation, nausea, rectal pain and vomiting.  Musculoskeletal: Negative.   Neurological:  Negative.   Psychiatric/Behavioral: Negative.      Per HPI unless specifically indicated above     Objective:    BP 129/72   Pulse 84   Ht 5' 0.5" (1.537 m)   Wt 186 lb (84.4 kg)   SpO2 97%   BMI 35.73 kg/m   Wt Readings from Last 3 Encounters:  09/15/23 186 lb (84.4 kg)  08/05/23 185 lb (83.9 kg)  08/05/23 185 lb (83.9 kg)    Physical Exam Vitals and nursing note reviewed.  Constitutional:      General: She is not in acute distress.    Appearance: Normal appearance. She is obese. She is not ill-appearing, toxic-appearing or diaphoretic.  HENT:     Head: Normocephalic and atraumatic.     Right Ear: External ear normal.     Left Ear: External ear normal.     Nose: Nose normal.     Mouth/Throat:     Mouth: Mucous membranes are moist.     Pharynx: Oropharynx is clear.  Eyes:     General: No scleral icterus.       Right eye: No discharge.        Left eye: No discharge.     Extraocular Movements: Extraocular movements intact.     Conjunctiva/sclera: Conjunctivae normal.     Pupils: Pupils are equal, round, and reactive to light.  Cardiovascular:     Rate and Rhythm: Normal rate and regular rhythm.     Pulses: Normal pulses.     Heart sounds: Normal heart sounds. No murmur heard.    No friction rub. No gallop.  Pulmonary:     Effort: Pulmonary effort is normal. No respiratory distress.     Breath sounds: Normal breath sounds. No stridor. No wheezing, rhonchi or rales.  Chest:     Chest wall: No tenderness.  Musculoskeletal:        General: Normal range of motion.     Cervical back: Normal range of motion and neck supple.  Skin:    General: Skin is warm and dry.     Capillary Refill: Capillary refill takes less than 2 seconds.     Coloration: Skin is not jaundiced or pale.     Findings: No bruising, erythema, lesion or rash.  Neurological:     General: No focal deficit present.     Mental Status: She is alert and oriented to person, place, and time. Mental status  is at baseline.  Psychiatric:        Mood and Affect: Mood normal.        Behavior: Behavior normal.        Thought Content: Thought content normal.        Judgment: Judgment normal.     Results for orders placed or performed during the hospital encounter of 08/20/23  ECHOCARDIOGRAM COMPLETE  Result Value Ref Range  Ao pk vel 1.66 m/s   AV Area VTI 2.54 cm2   AR max vel 2.38 cm2   AV Mean grad 5.0 mmHg   AV Peak grad 11.0 mmHg   Single Plane A2C EF 58.7 %   Single Plane A4C EF 64.3 %   Calc EF 61.7 %   S' Lateral 2.60 cm   AV Area mean vel 2.54 cm2   Area-P 1/2 2.53 cm2   MV VTI 3.56 cm2   Est EF 60 - 65%       Assessment & Plan:   Problem List Items Addressed This Visit       Cardiovascular and Mediastinum   Type 2 diabetes mellitus with cardiac complication (HCC)    Has been off her ozempic due to cost. Will restart and titrate her back up. Referral to pharmacy to help with cost. Sample given today. Recheck 2 months.       Relevant Medications   Semaglutide,0.25 or 0.5MG /DOS, (OZEMPIC, 0.25 OR 0.5 MG/DOSE,) 2 MG/3ML SOPN   Other Relevant Orders   AMB Referral to Pharmacy Medication Management     Digestive   Chronic diarrhea - Primary    States that it has been going on for years, since taking otezla. No better since stopping otezla. No better since being off ozempic- unlikely ozempic is causing it. Will get her back into her GI doctor for further evaluation. Stool studies ordered today. Await results.      Relevant Orders   Ambulatory referral to Gastroenterology   Stool C-Diff Toxin Assay   Ova and parasite examination   Fecal occult blood, imunochemical([facility])   Fecal leukocytes   Stool culture     Other   Depression, major, single episode, moderate (HCC)    Under good control on current regimen. Continue current regimen. Continue to monitor. Call with any concerns. Refills given.        Other Visit Diagnoses     Needs flu shot        Flu shot given today.   Relevant Orders   Flu Vaccine Trivalent High Dose (Fluad) (Completed)        Follow up plan: Return in about 2 months (around 11/15/2023).

## 2023-09-15 NOTE — Assessment & Plan Note (Signed)
Under good control on current regimen. Continue current regimen. Continue to monitor. Call with any concerns. Refills given.   

## 2023-09-15 NOTE — Assessment & Plan Note (Signed)
Has been off her ozempic due to cost. Will restart and titrate her back up. Referral to pharmacy to help with cost. Sample given today. Recheck 2 months.

## 2023-09-15 NOTE — Assessment & Plan Note (Signed)
States that it has been going on for years, since taking otezla. No better since stopping otezla. No better since being off ozempic- unlikely ozempic is causing it. Will get her back into her GI doctor for further evaluation. Stool studies ordered today. Await results.

## 2023-09-17 ENCOUNTER — Ambulatory Visit
Admission: RE | Admit: 2023-09-17 | Discharge: 2023-09-17 | Disposition: A | Discharge status: Home / Self Care | Payer: Medicare Other | Source: Ambulatory Visit | Attending: Family Medicine | Admitting: Family Medicine

## 2023-09-17 DIAGNOSIS — J449 Chronic obstructive pulmonary disease, unspecified: Secondary | ICD-10-CM | POA: Insufficient documentation

## 2023-09-17 DIAGNOSIS — E119 Type 2 diabetes mellitus without complications: Secondary | ICD-10-CM | POA: Diagnosis not present

## 2023-09-17 DIAGNOSIS — Z13820 Encounter for screening for osteoporosis: Secondary | ICD-10-CM | POA: Diagnosis present

## 2023-09-17 DIAGNOSIS — Z1231 Encounter for screening mammogram for malignant neoplasm of breast: Secondary | ICD-10-CM | POA: Insufficient documentation

## 2023-09-17 DIAGNOSIS — Z780 Asymptomatic menopausal state: Secondary | ICD-10-CM | POA: Diagnosis not present

## 2023-09-18 ENCOUNTER — Telehealth: Payer: Self-pay

## 2023-09-18 NOTE — Progress Notes (Signed)
Care Guide Note  09/18/2023 Name: [name] MRN: [id number] DOB: [birth year]  Referred by: Dorcas Carrow, DO Reason for referral : Care Coordination (Outreach to schedule with Pharm d )   Kelsey Mcdowell is a 77 y.o. year old female who is a primary care patient of Dorcas Carrow, DO. [name] was referred to the pharmacist for assistance related to DM.    Successful contact was made with the patient to discuss pharmacy services including being ready for the pharmacist to call at least 5 minutes before the scheduled appointment time, to have medication bottles and any blood sugar or blood pressure readings ready for review. The patient agreed to meet with the pharmacist via with the pharmacist via telephone visit on (date/time).  10/04/2023  Penne Lash, RMA Care Guide [facility]  [place], [place] [address] Direct Dial: [phone] [email or web address]

## 2023-09-23 ENCOUNTER — Encounter: Payer: Medicare Other | Admitting: Acute Care

## 2023-09-24 ENCOUNTER — Encounter: Payer: Medicare Other | Admitting: Nurse Practitioner

## 2023-09-24 ENCOUNTER — Other Ambulatory Visit: Payer: Medicare Other

## 2023-09-24 NOTE — Progress Notes (Signed)
09/24/2023 Name: Kelsey Mcdowell DOB: [birth year]  Chief Complaint  Patient presents with   Medication Assistance   Kelsey Mcdowell is a 77 y.o. year old female who presented for a telephone visit.   They were referred to the pharmacist by their PCP for assistance in managing medication access.    Subjective:  Care Team: Primary Care Provider: Dorcas Carrow, DO ; Next Scheduled Visit: 11/26  Medication Access/Adherence  Current Pharmacy:  [facility] [address] [place] - [address] [place] [address] Phone: [phone] Fax: [phone]  [facility] - [place], [place] - [address] [place] [address] Phone: [phone] Fax: [phone]  [id number] - [place], [place] - [address] [place] [address] Phone: [phone] Fax: [phone]  -Patient reports affordability concerns with their medications: Yes -Ozempic.  Patient is currently enrolled in PAP for Farxiga 10mg  daily , Entresto 24-26mg  BID, and Cosentyx -Patient reports access/transportation concerns to their pharmacy: No  -Patient reports adherence concerns with their medications:  Yes- has not been able to fill omeprazole 20mg  through [facility] pharmacy and is requesting script be sent to [facility]  Diabetes: Current medications: Farxiga 10mg  daily, Humulin R as needed based on BG, Ozempic 0.25mg  weekly -Medications tried in the past: glipizide listed as allergy/CI, metformin stopped due to AKI -Patient is prescribed Lantus 20 units daily.  She states she has this on hand but has not needed to use. -Patient monitors home BG regularly; not able to provide recent readings but does indicate they have been higher where she was without Ozempic for a while -Patient was taking Ozempic 2mg  weekly, but this was last filled on 6/20 for a 4 week supply.  Patient has not  been able to afford due to being in donut hole with Part D plan.  Sample was provided at 9/23 PCP visit, and patient has restarted medication at 0.25mg  weekly x4 weeks, then increasing to 0.5mg  weekly -Patient denies hypoglycemic s/sx including dizziness, shakiness, sweating.  -Patient denies hyperglycemic symptoms including polyuria, polydipsia, polyphagia, nocturia, neuropathy, blurred vision. -Current medication access support: [employer] PAP for Farxiga 10mg  daily  Objective: Lab Results  Component Value Date   HGBA1C 8.3 (H) 08/05/2023   Lab Results  Component Value Date   CREATININE 1.54 (H) 08/05/2023   BUN 22 08/05/2023   NA 139 08/05/2023   K 5.2 08/05/2023   CL 105 08/05/2023   CO2 20 08/05/2023   Medications Reviewed Today     Reviewed by [name], RPH (Pharmacist) on 09/24/23 at [phone]  Med List Status: <None>   Medication Order Taking? Sig Documenting Provider Last Dose Status Informant  acetaminophen (TYLENOL) 500 MG tablet [id number] Yes Take 500 mg by mouth every 6 (six) hours as needed (back pain). [provider] Taking Active   dapagliflozin propanediol (FARXIGA) 10 MG TABS tablet [id number] Yes Take 1 tablet (10 mg total) by mouth daily before breakfast. Delma Freeze, FNP Taking Active            Med Note Littie Deeds, Zandon Talton A   Wed Sep 24, 2023  8:33 AM) PAP  DULoxetine (CYMBALTA) 20 MG capsule [id number] Yes Take 1 capsule (20 mg total) by mouth daily. Johnson, Megan P, DO Taking Active   fluticasone (FLONASE) 50 MCG/ACT nasal spray [id number] Yes Place 2 sprays into both nostrils daily. Olevia Perches  P, DO Taking Active   gabapentin (NEURONTIN) 300 MG capsule Mcdowell Yes Start taking 1 cap BID for 3-4 days, then 1 cap in the AM and 2 caps in the PM for 3-4 days, then 2 caps BID for 3-4 days, then 2 caps in the AM and 3 caps in the PM Olivia Lopez de Gutierrez, Megan P, DO Taking Active            Med Note Littie Deeds, Jomaira Darr A   Wed Sep 24, 2023  8:35 AM) 1am and 2hs  insulin  glargine (LANTUS) 100 UNIT/ML [employer] Pen [id number] No Inject 20 Units into the skin daily.  Patient not taking: Reported on 09/24/2023   Dorcas Carrow, DO Not Taking Active            Med Note Littie Deeds, [place] A   Wed Sep 24, 2023  8:38 AM) Has on hand but has not needed  Insulin Pen Needle 34G X 3.5 MM MISC [id number] Yes 1 Dose by Does not apply route daily. Alford Highland, MD Taking Active   insulin regular (NOVOLIN R) 100 units/mL injection [id number] Yes Inject into the skin 3 (three) times daily before meals. [provider] Taking Active            Med Note Littie Deeds, Cleaven Demario A   Wed Sep 24, 2023  8:37 AM) Humulin R if needed based on BG  metoprolol succinate (TOPROL-XL) 25 MG 24 hr tablet [id number] Yes Take 1 tablet (25 mg total) by mouth daily. Olevia Perches P, DO Taking Active   Multiple Vitamin (MULTIVITAMIN WITH MINERALS) TABS tablet [id number] Yes Take 1 tablet by mouth daily. [provider] Taking Active   nortriptyline (PAMELOR) 25 MG capsule [id number] Yes Take 50 mg by mouth daily. [provider] Taking Active Self, Pharmacy Records  omeprazole (PRILOSEC) 20 MG capsule [id number] Yes Take 20 mg by mouth daily. [provider] Taking Active Self, Pharmacy Records  [facility] VERIO test strip [id number]  1 each 3 (three) times daily. [provider]  Active   rosuvastatin (CRESTOR) 10 MG tablet [id number] Yes Take 10 mg by mouth at bedtime. [provider] Taking Active   sacubitril-valsartan (ENTRESTO) 24-26 MG [id number] Yes Take 1 tablet by mouth 2 (two) times daily. Delma Freeze, FNP Taking Active            Med Note Littie Deeds, Desjuan Stearns A   Wed Sep 24, 2023  8:39 AM) PAP  Secukinumab (COSENTYX [place]) [id number] Yes Inject into the skin every 30 (thirty) days. Last date give was the week of 11/25/22. Pt unsure of exact date. [provider] Taking Active            Med Note Littie Deeds, Jolanda Mccann A   Wed Sep 24, 2023  8:40 AM) PAP   Semaglutide,0.25 or 0.5MG /DOS, (OZEMPIC, 0.25 OR 0.5 MG/DOSE,) 2 MG/3ML SOPN [id number] Yes Inject 0.25 mg into the skin once a week for 28 days, THEN 0.5 mg once a week for 28 days. Dorcas Carrow, DO Taking Active            Med Note Littie Deeds, Birdell Frasier A   Wed Sep 24, 2023  8:43 AM) Sample  spironolactone (ALDACTONE) 25 MG tablet [id number] Yes Take 1/2 (one-half) tablet by mouth once daily [name] A, FNP Taking Active   triamcinolone ointment (KENALOG) 0.5 % [id number] Yes Apply 1 Application topically daily as needed. [provider] Taking Active   vitamin B-12 (CYANOCOBALAMIN) 1000 MCG tablet [id number] Yes Take 1,000 mcg  by mouth daily. [provider] Taking Active            Assessment/Plan:   Medication Access/Adherence -Pending Omeprazole 20mg  daily prescription to be sent to [facility] for Dr. [name] to sign if in agreement  -Coordinating with medication assistance team to add [name] and Entresto to PAP spreadsheet, so we can assist with re-enrollment for 2025  Diabetes: - Currently uncontrolled - Consulting Dr. [name] regarding intended insulin dosing to verify patient is taking correctly - Recommend Ozempic 0.25mg  x4 weeks, 0.5mg  x4 weeks, 1mg  x4 weeks, then 2mg  thereafter - Meets financial criteria for Ozempic patient assistance program through [employer]. Will collaborate with provider, CPhT, and patient to pursue enrollment.  Follow Up Plan:  2 weeks  [name], PharmD, DPLA

## 2023-09-24 NOTE — Progress Notes (Unsigned)
PCP: Olevia Perches, DO (last seen 09/24) Primary Cardiologist: Leanora Ivanoff PA (last seen 09/24)  HPI:  Kelsey Mcdowell is a 77 yo with a PMHx of COPD, acute on chronic systolic CHF, DM II, tobacco abuse, obesity, and HTN.  Admitted 04/24/23 due to several days of diarrhea, decreased PO intake and worsening weakness. Found to have oliguric AKI. Held furosemide, aldactone, entresto, hydrochlorothiazide, dapagliflozin during hospital stay.   Echo 01/28/22: EF 40-45% LV with mildly decreased function and global hypokinesis with mild LVH.  Echo 08/20/23: EF 60-65% with mild LVH, Grade I DD  She presents today for a HF follow-up visit with a chief complaint of minimal shortness of breath with moderate exertion. Has associated rare chest pain, palpitations with exertion, moderate fatigue, pedal edema & slight weight gain along with this. Denies abdominal distention, dizziness or difficulty sleeping. Has not gotten compression socks yet.   Says that she's been following a low sodium diet except last night she ate at [facility] and overate.   ROS: All systems negative except as listed in HPI, PMH and Problem List.  SH:  Social History   Socioeconomic History   Marital status: Widowed    Spouse name: Not on file   Number of children: Not on file   Years of education: Not on file   Highest education level: Not on file  Occupational History   Not on file  Tobacco Use   Smoking status: Every Day    Current packs/day: 0.00    Types: Cigarettes    Last attempt to quit: 12/31/2022    Years since quitting: 0.7   Smokeless tobacco: Never  Vaping Use   Vaping status: Never Used  Substance and Sexual Activity   Alcohol use: Yes    Comment: occasional   Drug use: No   Sexual activity: Not on file  Other Topics Concern   Not on file  Social History Narrative   Not on file   Social Determinants of Health   Financial Resource Strain: Low Risk  (04/25/2023)   Received from [facility],  [facility]   Overall Financial Resource Strain (CARDIA)    Difficulty of Paying Living Expenses: Not very hard  Food Insecurity: No Food Insecurity (04/25/2023)   Received from [facility], [facility]   Hunger Vital Sign    Worried About Running Out of Food in the Last Year: Never true    Ran Out of Food in the Last Year: Never true  Transportation Needs: No Transportation Needs (04/25/2023)   Received from [facility], [facility]   [facility] - Transportation    Lack of Transportation (Medical): No    Lack of Transportation (Non-Medical): No  Physical Activity: Not on file  Stress: Not on file  Social Connections: Not on file  Intimate Partner Violence: Not on file    FH:  Family History  Problem Relation Age of Onset   Cancer Mother    Hypertension Mother    Diabetes Mother    Heart disease Father    Diabetes Father    Cancer Sister    Breast cancer Sister 68   Heart disease Brother     Past Medical History:  Diagnosis Date   Arthritis    CHF (congestive heart failure) (HCC)    COPD (chronic obstructive pulmonary disease) (HCC)    Diabetes mellitus without complication (HCC)    GERD (gastroesophageal reflux disease)    Hyperlipemia    Hypertension  Myocardial infarction (HCC)    light heart attack ?when   Neuropathy    Sleep apnea     Current Outpatient Medications  Medication Sig Dispense Refill   acetaminophen (TYLENOL) 500 MG tablet Take 500 mg by mouth every 6 (six) hours as needed (back pain).     dapagliflozin propanediol (FARXIGA) 10 MG TABS tablet Take 1 tablet (10 mg total) by mouth daily before breakfast. 90 tablet 3   DULoxetine (CYMBALTA) 20 MG capsule Take 1 capsule (20 mg total) by mouth daily. 30 capsule 3   fluticasone (FLONASE) 50 MCG/ACT nasal spray Place 2 sprays into both nostrils daily. 16 g 12   gabapentin (NEURONTIN) 300 MG capsule Start taking 1 cap BID for 3-4 days, then 1 cap in the AM and 2 caps in the PM for 3-4  days, then 2 caps BID for 3-4 days, then 2 caps in the AM and 3 caps in the PM 150 capsule 3   insulin glargine (LANTUS) 100 UNIT/ML Solostar Pen Inject 20 Units into the skin daily. (Patient not taking: Reported on 09/24/2023) 15 mL 0   Insulin Pen Needle 34G X 3.5 MM MISC 1 Dose by Does not apply route daily. 100 each 0   insulin regular (NOVOLIN R) 100 units/mL injection Inject into the skin 3 (three) times daily before meals.     metoprolol succinate (TOPROL-XL) 25 MG 24 hr tablet Take 1 tablet (25 mg total) by mouth daily. 180 tablet 0   Multiple Vitamin (MULTIVITAMIN WITH MINERALS) TABS tablet Take 1 tablet by mouth daily.     nortriptyline (PAMELOR) 25 MG capsule Take 50 mg by mouth daily.     omeprazole (PRILOSEC) 20 MG capsule Take 20 mg by mouth daily.     ONETOUCH VERIO test strip 1 each 3 (three) times daily.     rosuvastatin (CRESTOR) 10 MG tablet Take 10 mg by mouth at bedtime.     sacubitril-valsartan (ENTRESTO) 24-26 MG Take 1 tablet by mouth 2 (two) times daily. 60 tablet 3   Secukinumab (COSENTYX [place]) Inject into the skin every 30 (thirty) days. Last date give was the week of 11/25/22. Pt unsure of exact date.     Semaglutide,0.25 or 0.5MG /DOS, (OZEMPIC, 0.25 OR 0.5 MG/DOSE,) 2 MG/3ML SOPN Inject 0.25 mg into the skin once a week for 28 days, THEN 0.5 mg once a week for 28 days.     spironolactone (ALDACTONE) 25 MG tablet Take 1/2 (one-half) tablet by mouth once daily 15 tablet 5   triamcinolone ointment (KENALOG) 0.5 % Apply 1 Application topically daily as needed.     vitamin B-12 (CYANOCOBALAMIN) 1000 MCG tablet Take 1,000 mcg by mouth daily.     No current facility-administered medications for this visit.   Vitals:   09/25/23 1129  BP: 135/79  Pulse: 92  Resp: 14  SpO2: 96%  Weight: 187 lb 4 oz (84.9 kg)   Wt Readings from Last 3 Encounters:  09/25/23 187 lb 4 oz (84.9 kg)  09/15/23 186 lb (84.4 kg)  08/05/23 185 lb (83.9 kg)   Lab Results  Component Value Date    CREATININE 1.54 (H) 08/05/2023   CREATININE 1.74 (H) 05/08/2023   CREATININE 1.69 (H) 01/16/2023   PHYSICAL EXAM:  General:  Well appearing. No resp difficulty HEENT: normal Neck: supple. JVP flat. No lymphadenopathy or thryomegaly appreciated. Cor: PMI normal. Regular rate & rhythm. No rubs, gallops or murmurs. Lungs: clear Abdomen: soft, nontender, nondistended. No hepatosplenomegaly. No bruits or  masses. Extremities: no cyanosis, clubbing, rash. 1+ edema bilateral lower legs Neuro: alert & oriented x3, cranial nerves grossly intact. Moves all 4 extremities w/o difficulty. Affect pleasant.   ECG: not done   ASSESSMENT & PLAN:  NICM with mildly reduced ejection fraction- - likely due to to HTN/ COPD - NYHA class II - euvolemic - weighing most days; reviewed the importance of weighing daily and to call for an overnight weight gain of > 2 pounds or a weekly weight gain of > 5 pounds - weight up 2 pounds from last visit here 2 months ago - echo 01/28/22: EF 40-45% LV with mildly decreased function and global hypokinesis with mild LVH - echo 08/20/23: EF 60-65% with mild LVH, Grade I DD - reviewed keeping daily fluid intake to 60-64 ounces - reviewed keeping daily sodium intake to 2000mg   - continue entresto 24/26mg  BID - continue farxiga 10mg  daily - continue metoprolol succinate 25mg  daily - continue spironolactone 12.5mg  daily - emphasized, again, to get compression socks and put them on every morning with removal at bedtime - not adding salt to her food but does like to eat out on occasion - elevate legs when sitting for long periods of time - saw cardiology Mellissa Kohut) 09/24 - encouraged as much activity as possible - BNP 01/27/22 721.9  Hypertension- - BP 135/79 - saw PCP [name]) 09/24 - BMP 09/10/23 reviewed and showed sodium 140, potassium 4.0, creatinine 1.56 and GFR 34  COPD- - saw pulmonologist Meredeth Ide) 06/24 - smoking 1 pack every 2-3 days; does not smoke with  oxygen on - continued cessation discussed for 3 minutes - on O2 @ 2L at bedtime and PRN during the day; says that she doesn't wear it much during the day  DM- - A1c 04/24/23 was 7.9%  - ozempic 2mg  weekly - saw nephrology Cherylann Ratel) 09/24  Return in 1 month, sooner if needed.

## 2023-09-25 ENCOUNTER — Encounter: Payer: Self-pay | Admitting: Family

## 2023-09-25 ENCOUNTER — Ambulatory Visit: Payer: Medicare Other | Attending: Family | Admitting: Family

## 2023-09-25 VITALS — BP 135/79 | HR 92 | Resp 14 | Wt 187.2 lb

## 2023-09-25 DIAGNOSIS — I10 Essential (primary) hypertension: Secondary | ICD-10-CM

## 2023-09-25 DIAGNOSIS — Z79899 Other long term (current) drug therapy: Secondary | ICD-10-CM | POA: Insufficient documentation

## 2023-09-25 DIAGNOSIS — Z7984 Long term (current) use of oral hypoglycemic drugs: Secondary | ICD-10-CM | POA: Insufficient documentation

## 2023-09-25 DIAGNOSIS — I428 Other cardiomyopathies: Secondary | ICD-10-CM | POA: Insufficient documentation

## 2023-09-25 DIAGNOSIS — Z794 Long term (current) use of insulin: Secondary | ICD-10-CM | POA: Insufficient documentation

## 2023-09-25 DIAGNOSIS — E1122 Type 2 diabetes mellitus with diabetic chronic kidney disease: Secondary | ICD-10-CM

## 2023-09-25 DIAGNOSIS — J449 Chronic obstructive pulmonary disease, unspecified: Secondary | ICD-10-CM | POA: Diagnosis not present

## 2023-09-25 DIAGNOSIS — Z7985 Long-term (current) use of injectable non-insulin antidiabetic drugs: Secondary | ICD-10-CM | POA: Diagnosis not present

## 2023-09-25 DIAGNOSIS — I110 Hypertensive heart disease with heart failure: Secondary | ICD-10-CM | POA: Diagnosis not present

## 2023-09-25 DIAGNOSIS — E669 Obesity, unspecified: Secondary | ICD-10-CM | POA: Insufficient documentation

## 2023-09-25 DIAGNOSIS — E119 Type 2 diabetes mellitus without complications: Secondary | ICD-10-CM | POA: Insufficient documentation

## 2023-09-25 DIAGNOSIS — I5022 Chronic systolic (congestive) heart failure: Secondary | ICD-10-CM | POA: Diagnosis not present

## 2023-09-25 DIAGNOSIS — N1831 Chronic kidney disease, stage 3a: Secondary | ICD-10-CM

## 2023-09-25 NOTE — Patient Instructions (Signed)
Get compression socks and put them on every morning.

## 2023-09-29 ENCOUNTER — Other Ambulatory Visit: Payer: Medicare Other

## 2023-09-29 MED ORDER — OMEPRAZOLE 20 MG PO CPDR: 20.0000 mg | DELAYED_RELEASE_CAPSULE | Freq: Every day | ORAL | 3 refills | Status: DC

## 2023-09-29 NOTE — Progress Notes (Signed)
All set!

## 2023-09-29 NOTE — Progress Notes (Signed)
09/29/2023  Patient ID: Kelsey Mcdowell, female   DOB: Mar 26, 1946, 77 y.o.   MRN: [id number]  Patient outreach to follow-up on DM pharmacotherapy plan after recent telephone visit.  I was unable to reach the patient but left HIPAA compliant voicemail with my direct phone number and will attempt to reach her again tomorrow afternoon if I do not hear back.  [name], PharmD, DPLA

## 2023-09-30 ENCOUNTER — Other Ambulatory Visit: Payer: Medicare Other

## 2023-09-30 NOTE — Progress Notes (Signed)
09/30/2023  Patient ID: Kelsey Mcdowell, female   DOB: 1946-05-07, 77 y.o.   MRN: [id number]  Telephone follow-up to discuss diabetes management plan as we titrate Ozempic dosing back up to 2mg  weekly  -Patient has Ozempic 0.25/0.5mg  sample on hand- using 0.25mg  x4 weeks, then increasing to 0.5mg  -Will check with [facility] after next follow-up visit to see if they have another sample she can continue to use for 0.5mg  weekly dosing until 1mg  arrives from [employer] -Instructed patient to resume Lantus daily at 20 units, and Humulin R at 4 units before breakfast and supper, skipping breakfast dose if FBG <150.  Patient endorses understanding.  Follow-up is scheduled for 10/16

## 2023-10-01 ENCOUNTER — Ambulatory Visit: Payer: Medicare Other

## 2023-10-02 ENCOUNTER — Telehealth: Payer: Self-pay

## 2023-10-02 NOTE — Telephone Encounter (Signed)
Submitted application for OZEMPIC to [employer] for patient assistance via online portal.   Phone: [phone]  

## 2023-10-02 NOTE — Telephone Encounter (Signed)
-----   Message from [name] sent at 09/29/2023  3:08 PM EDT ----- Good afternoon,  Patient qualifies for [employer] for Ozempic 1mg  weekly.  Do you all mind to initiate the application process, please?  Thank you!  [name], PharmD, DPLA

## 2023-10-07 NOTE — Telephone Encounter (Signed)
Faxed part D information requested from [employer].

## 2023-10-08 ENCOUNTER — Other Ambulatory Visit: Payer: Self-pay

## 2023-10-08 ENCOUNTER — Telehealth: Payer: Self-pay | Admitting: Family Medicine

## 2023-10-08 DIAGNOSIS — Z79899 Other long term (current) drug therapy: Secondary | ICD-10-CM

## 2023-10-08 NOTE — Telephone Encounter (Signed)
Pt is calling in requesting Dr. [name] give her a call when she has a chance. Pt did not specify the reasoning.

## 2023-10-08 NOTE — Progress Notes (Signed)
10/08/2023  Patient ID: Kelsey Mcdowell, female   DOB: 1946/04/09, 77 y.o.   MRN: [id number]  S/O  Diabetes Management Plan -Current medications:  Farxiga 10mg  daily, Lantus 20 units daily, Humulin R 4 units BID, Ozempic 0.25mg  weekly  -FBG this morning 133 -Endorses 1 instance of hypoglycemia in the last week -Yesterday was 3rd dose of Ozempic 0.25mg  -Current medication assistance:  AZ&Me for [place] and working on [employer] PAP for [name]  A/P  Diabetes Management Plan -Take 1 more dose of Ozempic 0.25mg  Tuesday of next week, then increase to 0.5mg  weekly and decrease Lantus to 16 units -[employer] application for Ozempic 1mg  is in process- I will call to check on status -Contacting Dr. Michaelyn Barter to see if [facility] has 1 more sample of 0.25/0.5mg  she can get to complete 4 weeks at 0.5mg  weekly  Follow-up:  11/13  Kelsey Mcdowell, PharmD, DPLA

## 2023-10-08 NOTE — Telephone Encounter (Signed)
Called and spoke to patient. Patient states that she went to dermatology last week and they are requesting that she has some blood work done as below:  CBC with dif- Z79.899 CMP- Z79.899 Quantiferon Gold  Patient states that she has went to [facility] before to have labs and they charged her $95 upfront just to do her labs. Patient wants to know if Dr. [name] will be willing to enter these labs for the patient to have drawn here so she doesn't have to pay that fee.

## 2023-10-10 NOTE — Progress Notes (Signed)
10/10/2023  Patient ID: Kelsey Mcdowell, female   DOB: 24-Feb-1946, 77 y.o.   MRN: [id number]  Contacted [employer] to check on processing status of Ozempic 1mg  application, and they stated they were missing the name of the patient's medicare D plan.

## 2023-10-10 NOTE — Telephone Encounter (Signed)
Attempted to reach no answer left vm for a return call   OKAY for [facility] to inform pt of what provider stated

## 2023-10-10 NOTE — Telephone Encounter (Signed)
If dermatology also ordered them I cannot say she will not have to pay that. However, I'm happy to place the orders for her to have done here.

## 2023-10-13 ENCOUNTER — Telehealth: Payer: Self-pay | Admitting: Family Medicine

## 2023-10-13 NOTE — Telephone Encounter (Signed)
Please see previous TE. Orders are already in.

## 2023-10-13 NOTE — Telephone Encounter (Signed)
Copied from CRM [id number]. Topic: General - Other >> Oct 10, 2023  4:53 PM Ja-Kwan M wrote: Reason for CRM: Pt requests that the labs be ordered because she would like to have the labs drawn on Monday if possible.

## 2023-10-13 NOTE — Telephone Encounter (Signed)
Routed to the clinical staff

## 2023-10-13 NOTE — Telephone Encounter (Signed)
Lab appt has been made   

## 2023-10-13 NOTE — Telephone Encounter (Signed)
Spoke with [facility] Representative to confirm the medical diagnosis for the patient. Representative was informed that patient has listed in her chart Acute Chronic Heart Failure, Hypertension and Type 2 Diabetes. Representative verbalized understanding and has no further questions at this time.

## 2023-10-13 NOTE — Telephone Encounter (Signed)
Kelsey Mcdowell with [facility] is calling in because they need to verify the pt's diagnosis of Diabetes and wants to know if the pt has any cardiovascular disease. Please follow up with [facility] at [phone] Reference Number: [id number]

## 2023-10-14 ENCOUNTER — Other Ambulatory Visit: Payer: Medicare Other

## 2023-10-14 DIAGNOSIS — Z79899 Other long term (current) drug therapy: Secondary | ICD-10-CM

## 2023-10-14 DIAGNOSIS — K529 Noninfective gastroenteritis and colitis, unspecified: Secondary | ICD-10-CM

## 2023-10-15 NOTE — Progress Notes (Signed)
10/15/2023  Patient ID: [name], female   DOB: [birth year], 77 y.o.   MRN: [id number]  Patient outreach to inform Ms. Lighty Dr. [name] has signed out another 0.25/05.mg Ozempic sample that she can't pick up at her convenience.  This will give her enough medication to complete 4 weeks at 0.5mg  dose before starting 1mg  pens coming from [employer].  [name], PharmD, DPLA

## 2023-10-16 ENCOUNTER — Other Ambulatory Visit: Payer: Self-pay

## 2023-10-17 LAB — CBC WITH DIFFERENTIAL/PLATELET
Basophils Absolute: 0.1 10*3/uL (ref 0.0–0.2)
Basos: 1 %
EOS (ABSOLUTE): 0.2 10*3/uL (ref 0.0–0.4)
Eos: 3 %
Hematocrit: 37.7 % (ref 34.0–46.6)
Hemoglobin: 11.7 g/dL (ref 11.1–15.9)
Immature Grans (Abs): 0 10*3/uL (ref 0.0–0.1)
Immature Granulocytes: 0 %
Lymphocytes Absolute: 2.7 10*3/uL (ref 0.7–3.1)
Lymphs: 45 %
MCH: 31.1 pg (ref 26.6–33.0)
MCHC: 31 g/dL — ABNORMAL LOW (ref 31.5–35.7)
MCV: 100 fL — ABNORMAL HIGH (ref 79–97)
Monocytes Absolute: 0.4 10*3/uL (ref 0.1–0.9)
Monocytes: 7 %
Neutrophils Absolute: 2.7 10*3/uL (ref 1.4–7.0)
Neutrophils: 44 %
Platelets: 221 10*3/uL (ref 150–450)
RBC: 3.76 x10E6/uL — ABNORMAL LOW (ref 3.77–5.28)
RDW: 14 % (ref 11.7–15.4)
WBC: 6.1 10*3/uL (ref 3.4–10.8)

## 2023-10-17 LAB — COMPREHENSIVE METABOLIC PANEL
ALT: 8 [IU]/L (ref 0–32)
AST: 14 [IU]/L (ref 0–40)
Albumin: 3.8 g/dL (ref 3.8–4.8)
Alkaline Phosphatase: 62 [IU]/L (ref 44–121)
BUN/Creatinine Ratio: 10 — ABNORMAL LOW (ref 12–28)
BUN: 14 mg/dL (ref 8–27)
Bilirubin Total: 0.3 mg/dL (ref 0.0–1.2)
CO2: 22 mmol/L (ref 20–29)
Calcium: 8.8 mg/dL (ref 8.7–10.3)
Chloride: 106 mmol/L (ref 96–106)
Creatinine, Ser: 1.38 mg/dL — ABNORMAL HIGH (ref 0.57–1.00)
Globulin, Total: 2.8 g/dL (ref 1.5–4.5)
Glucose: 203 mg/dL — ABNORMAL HIGH (ref 70–99)
Potassium: 4.1 mmol/L (ref 3.5–5.2)
Sodium: 143 mmol/L (ref 134–144)
Total Protein: 6.6 g/dL (ref 6.0–8.5)
eGFR: 39 mL/min/{1.73_m2} — ABNORMAL LOW (ref >59)

## 2023-10-17 LAB — QUANTIFERON-TB GOLD PLUS
QuantiFERON Mitogen Value: >10 [IU]/mL
QuantiFERON Nil Value: 0.08 [IU]/mL
QuantiFERON TB1 Ag Value: 0.07 [IU]/mL
QuantiFERON TB2 Ag Value: 0.06 [IU]/mL
QuantiFERON-TB Gold Plus: NEGATIVE

## 2023-10-17 MED ORDER — ONETOUCH VERIO VI STRP: 1.0000 | ORAL_STRIP | Freq: Three times a day (TID) | 12 refills | Status: DC

## 2023-10-17 NOTE — Telephone Encounter (Signed)
Received notification from [employer] regarding approval for OZEMPIC. Patient assistance approved from 10/13/23 to 12/23/23.  Medication will ship to [facility]  Pt ID: [id number]  Company phone: [phone]

## 2023-10-21 ENCOUNTER — Telehealth: Payer: Self-pay

## 2023-10-21 NOTE — Telephone Encounter (Signed)
4 boxes of Ozempic received for the patient.   Called and LVM notifying patient that her medication was ready to be picked up.

## 2023-10-23 LAB — STOOL CULTURE: E coli, Shiga toxin Assay: NEGATIVE

## 2023-10-23 LAB — CLOSTRIDIUM DIFFICILE EIA: C difficile Toxins A+B, EIA: NEGATIVE

## 2023-10-23 LAB — FECAL OCCULT BLOOD, IMMUNOCHEMICAL: Fecal Occult Bld: NEGATIVE

## 2023-10-23 LAB — FECAL LEUKOCYTES

## 2023-10-23 LAB — OVA AND PARASITE EXAMINATION

## 2023-10-27 ENCOUNTER — Other Ambulatory Visit: Payer: Self-pay | Admitting: Family Medicine

## 2023-10-27 NOTE — Telephone Encounter (Signed)
One Touch Ultra 2 Kit not on current list, routing for approval.

## 2023-10-27 NOTE — Progress Notes (Deleted)
PCP: Olevia Perches, DO (last seen 09/24) Primary Cardiologist: Leanora Ivanoff PA (last seen 09/24)  HPI:  Kelsey Mcdowell is a 77 yo with a PMHx of COPD, acute on chronic systolic CHF, DM II, tobacco abuse, obesity, and HTN.  Admitted 04/24/23 due to several days of diarrhea, decreased PO intake and worsening weakness. Found to have oliguric AKI. Held furosemide, aldactone, entresto, hydrochlorothiazide, dapagliflozin during hospital stay.   Echo 01/28/22: EF 40-45% LV with mildly decreased function and global hypokinesis with mild LVH.  Echo 08/20/23: EF 60-65% with mild LVH, Grade I DD  She presents today for a HF follow-up visit with a chief complaint of minimal shortness of breath with moderate exertion. Has associated rare chest pain, palpitations with exertion, moderate fatigue, pedal edema & slight weight gain along with this. Denies abdominal distention, dizziness or difficulty sleeping. Has not gotten compression socks yet.   Says that she's been following a low sodium diet except last night she ate at [facility] and overate.   ROS: All systems negative except as listed in HPI, PMH and Problem List.  SH:  Social History   Socioeconomic History   Marital status: Widowed    Spouse name: Not on file   Number of children: Not on file   Years of education: Not on file   Highest education level: Not on file  Occupational History   Not on file  Tobacco Use   Smoking status: Every Day    Current packs/day: 0.00    Types: Cigarettes    Last attempt to quit: 12/31/2022    Years since quitting: 0.8   Smokeless tobacco: Never  Vaping Use   Vaping status: Never Used  Substance and Sexual Activity   Alcohol use: Yes    Comment: occasional   Drug use: No   Sexual activity: Not on file  Other Topics Concern   Not on file  Social History Narrative   Not on file   Social Determinants of Health   Financial Resource Strain: Low Risk  (04/25/2023)   Received from [facility],  [facility]   Overall Financial Resource Strain (CARDIA)    Difficulty of Paying Living Expenses: Not very hard  Food Insecurity: No Food Insecurity (04/25/2023)   Received from [facility], [facility]   Hunger Vital Sign    Worried About Running Out of Food in the Last Year: Never true    Ran Out of Food in the Last Year: Never true  Transportation Needs: No Transportation Needs (04/25/2023)   Received from [facility], [facility]   [facility] - Transportation    Lack of Transportation (Medical): No    Lack of Transportation (Non-Medical): No  Physical Activity: Not on file  Stress: Not on file  Social Connections: Not on file  Intimate Partner Violence: Not on file    FH:  Family History  Problem Relation Age of Onset   Cancer Mother    Hypertension Mother    Diabetes Mother    Heart disease Father    Diabetes Father    Cancer Sister    Breast cancer Sister 93   Heart disease Brother     Past Medical History:  Diagnosis Date   Arthritis    CHF (congestive heart failure) (HCC)    COPD (chronic obstructive pulmonary disease) (HCC)    Diabetes mellitus without complication (HCC)    GERD (gastroesophageal reflux disease)    Hyperlipemia    Hypertension  Myocardial infarction (HCC)    light heart attack ?when   Neuropathy    Sleep apnea     Current Outpatient Medications  Medication Sig Dispense Refill   acetaminophen (TYLENOL) 500 MG tablet Take 500 mg by mouth every 6 (six) hours as needed (back pain).     dapagliflozin propanediol (FARXIGA) 10 MG TABS tablet Take 1 tablet (10 mg total) by mouth daily before breakfast. 90 tablet 3   DULoxetine (CYMBALTA) 20 MG capsule Take 1 capsule (20 mg total) by mouth daily. 30 capsule 3   fluticasone (FLONASE) 50 MCG/ACT nasal spray Place 2 sprays into both nostrils daily. 16 g 12   gabapentin (NEURONTIN) 300 MG capsule Start taking 1 cap BID for 3-4 days, then 1 cap in the AM and 2 caps in the PM for 3-4  days, then 2 caps BID for 3-4 days, then 2 caps in the AM and 3 caps in the PM 150 capsule 3   insulin glargine (LANTUS) 100 UNIT/ML Solostar Pen Inject 20 Units into the skin daily. 15 mL 0   Insulin Pen Needle 34G X 3.5 MM MISC 1 Dose by Does not apply route daily. 100 each 0   insulin regular (NOVOLIN R) 100 units/mL injection Inject 4 Units into the skin 2 (two) times daily before a meal.     metoprolol succinate (TOPROL-XL) 25 MG 24 hr tablet Take 1 tablet (25 mg total) by mouth daily. 180 tablet 0   Multiple Vitamin (MULTIVITAMIN WITH MINERALS) TABS tablet Take 1 tablet by mouth daily.     nortriptyline (PAMELOR) 25 MG capsule Take 50 mg by mouth daily.     omeprazole (PRILOSEC) 20 MG capsule Take 1 capsule (20 mg total) by mouth daily. 90 capsule 3   ONETOUCH VERIO test strip 1 each by Other route 3 (three) times daily. 100 each 12   rosuvastatin (CRESTOR) 10 MG tablet Take 10 mg by mouth at bedtime.     sacubitril-valsartan (ENTRESTO) 24-26 MG Take 1 tablet by mouth 2 (two) times daily. 60 tablet 3   Secukinumab (COSENTYX [place]) Inject into the skin every 30 (thirty) days. Last date give was the week of 11/25/22. Pt unsure of exact date.     Semaglutide,0.25 or 0.5MG /DOS, (OZEMPIC, 0.25 OR 0.5 MG/DOSE,) 2 MG/3ML SOPN Inject 0.25 mg into the skin once a week for 28 days, THEN 0.5 mg once a week for 28 days.     spironolactone (ALDACTONE) 25 MG tablet Take 1/2 (one-half) tablet by mouth once daily 15 tablet 5   triamcinolone ointment (KENALOG) 0.5 % Apply 1 Application topically daily as needed.     vitamin B-12 (CYANOCOBALAMIN) 1000 MCG tablet Take 1,000 mcg by mouth daily.     No current facility-administered medications for this visit.   There were no vitals filed for this visit.  Wt Readings from Last 3 Encounters:  09/25/23 187 lb 4 oz (84.9 kg)  09/15/23 186 lb (84.4 kg)  08/05/23 185 lb (83.9 kg)   Lab Results  Component Value Date   CREATININE 1.38 (H) 10/14/2023   CREATININE  1.54 (H) 08/05/2023   CREATININE 1.74 (H) 05/08/2023   PHYSICAL EXAM:  General:  Well appearing. No resp difficulty HEENT: normal Neck: supple. JVP flat. No lymphadenopathy or thryomegaly appreciated. Cor: PMI normal. Regular rate & rhythm. No rubs, gallops or murmurs. Lungs: clear Abdomen: soft, nontender, nondistended. No hepatosplenomegaly. No bruits or masses. Extremities: no cyanosis, clubbing, rash. 1+ edema bilateral lower legs Neuro: alert &  oriented x3, cranial nerves grossly intact. Moves all 4 extremities w/o difficulty. Affect pleasant.   ECG: not done   ASSESSMENT & PLAN:  NICM with mildly reduced ejection fraction- - likely due to to HTN/ COPD - NYHA class II - euvolemic - weighing most days; reviewed the importance of weighing daily and to call for an overnight weight gain of > 2 pounds or a weekly weight gain of > 5 pounds - weight up 2 pounds from last visit here 2 months ago - echo 01/28/22: EF 40-45% LV with mildly decreased function and global hypokinesis with mild LVH - echo 08/20/23: EF 60-65% with mild LVH, Grade I DD - reviewed keeping daily fluid intake to 60-64 ounces - reviewed keeping daily sodium intake to 2000mg   - continue entresto 24/26mg  BID - continue farxiga 10mg  daily - continue metoprolol succinate 25mg  daily - continue spironolactone 12.5mg  daily - emphasized, again, to get compression socks and put them on every morning with removal at bedtime - not adding salt to her food but does like to eat out on occasion - elevate legs when sitting for long periods of time - saw cardiology Mellissa Kohut) 09/24 - encouraged as much activity as possible - BNP 01/27/22 721.9  Hypertension- - BP 135/79 - saw PCP [name]) 09/24 - BMP 09/10/23 reviewed and showed sodium 140, potassium 4.0, creatinine 1.56 and GFR 34  COPD- - saw pulmonologist Meredeth Ide) 06/24 - smoking 1 pack every 2-3 days; does not smoke with oxygen on - continued cessation discussed for 3  minutes - on O2 @ 2L at bedtime and PRN during the day; says that she doesn't wear it much during the day  DM- - A1c 04/24/23 was 7.9%  - ozempic 2mg  weekly - saw nephrology Cherylann Ratel) 09/24  Return in 1 month, sooner if needed.

## 2023-10-27 NOTE — Telephone Encounter (Signed)
Medication Refill - Medication: One Touch Ultra 2 Kit   Has the patient contacted their pharmacy? Yes.   (Agent: If no, request that the patient contact the pharmacy for the refill. If patient does not wish to contact the pharmacy document the reason why and proceed with request.) (Agent: If yes, when and what did the pharmacy advise?)  Preferred Pharmacy (with phone number or street name):  [facility] - [place], [place] - [address]  [address] [place] [address]  Phone: [phone] Fax: [phone]   Has the patient been seen for an appointment in the last year OR does the patient have an upcoming appointment? Yes.    Agent: Please be advised that RX refills may take up to 3 business days. We ask that you follow-up with your pharmacy.

## 2023-10-28 ENCOUNTER — Encounter: Payer: Medicare Other | Admitting: Family

## 2023-10-28 ENCOUNTER — Telehealth: Payer: Self-pay | Admitting: Family

## 2023-10-28 NOTE — Telephone Encounter (Signed)
Patient did not show for her [facility] appointment on 10/28/23

## 2023-10-29 ENCOUNTER — Encounter: Payer: Self-pay | Admitting: Family

## 2023-10-29 ENCOUNTER — Ambulatory Visit: Payer: Medicare Other | Attending: Family | Admitting: Family

## 2023-10-29 VITALS — BP 154/66 | HR 76 | Resp 18 | Wt 189.0 lb

## 2023-10-29 DIAGNOSIS — I10 Essential (primary) hypertension: Secondary | ICD-10-CM

## 2023-10-29 DIAGNOSIS — Z79899 Other long term (current) drug therapy: Secondary | ICD-10-CM | POA: Diagnosis not present

## 2023-10-29 DIAGNOSIS — J449 Chronic obstructive pulmonary disease, unspecified: Secondary | ICD-10-CM | POA: Insufficient documentation

## 2023-10-29 DIAGNOSIS — I110 Hypertensive heart disease with heart failure: Secondary | ICD-10-CM | POA: Insufficient documentation

## 2023-10-29 DIAGNOSIS — Z7985 Long-term (current) use of injectable non-insulin antidiabetic drugs: Secondary | ICD-10-CM | POA: Insufficient documentation

## 2023-10-29 DIAGNOSIS — F17210 Nicotine dependence, cigarettes, uncomplicated: Secondary | ICD-10-CM | POA: Insufficient documentation

## 2023-10-29 DIAGNOSIS — I5032 Chronic diastolic (congestive) heart failure: Secondary | ICD-10-CM | POA: Diagnosis not present

## 2023-10-29 DIAGNOSIS — I5022 Chronic systolic (congestive) heart failure: Secondary | ICD-10-CM | POA: Insufficient documentation

## 2023-10-29 DIAGNOSIS — E1122 Type 2 diabetes mellitus with diabetic chronic kidney disease: Secondary | ICD-10-CM

## 2023-10-29 DIAGNOSIS — Z794 Long term (current) use of insulin: Secondary | ICD-10-CM

## 2023-10-29 DIAGNOSIS — E669 Obesity, unspecified: Secondary | ICD-10-CM | POA: Insufficient documentation

## 2023-10-29 DIAGNOSIS — I428 Other cardiomyopathies: Secondary | ICD-10-CM | POA: Insufficient documentation

## 2023-10-29 DIAGNOSIS — N1831 Chronic kidney disease, stage 3a: Secondary | ICD-10-CM

## 2023-10-29 DIAGNOSIS — E1140 Type 2 diabetes mellitus with diabetic neuropathy, unspecified: Secondary | ICD-10-CM | POA: Insufficient documentation

## 2023-10-29 NOTE — Progress Notes (Signed)
PCP: Olevia Perches, DO (last seen 09/24) Primary Cardiologist: Leanora Ivanoff PA (last seen 09/24)  HPI:  Kelsey Mcdowell is a 77 yo with a PMHx of COPD, acute on chronic systolic CHF, DM II, tobacco abuse, obesity, and HTN.  Admitted 04/24/23 due to several days of diarrhea, decreased PO intake and worsening weakness. Found to have oliguric AKI. Held furosemide, aldactone, entresto, hydrochlorothiazide, dapagliflozin during hospital stay.   Echo 01/28/22: EF 40-45% LV with mildly decreased function and global hypokinesis with mild LVH.  Echo 08/20/23: EF 60-65% with mild LVH, Grade I DD  She presents today for a HF follow-up visit with a chief complaint of minimal shortness of breath with moderate exertion. Has associated pedal edema in left leg along with this. Denies fatigue, chest pain, cough, palpitations, abdominal distention, dizziness or difficulty sleeping. Unable to wear compression socks as they hurt her legs.   Does admit to being frustrated about her weight. Has some difficulty doing too much walking because of neuropathy in her legs. Was out of ozemipc for ~ 1 week due to access but resumed it 3 weeks ago.   ROS: All systems negative except as listed in HPI, PMH and Problem List.  SH:  Social History   Socioeconomic History   Marital status: Widowed    Spouse name: Not on file   Number of children: Not on file   Years of education: Not on file   Highest education level: Not on file  Occupational History   Not on file  Tobacco Use   Smoking status: Every Day    Current packs/day: 0.00    Types: Cigarettes    Last attempt to quit: 12/31/2022    Years since quitting: 0.8   Smokeless tobacco: Never  Vaping Use   Vaping status: Never Used  Substance and Sexual Activity   Alcohol use: Yes    Comment: occasional   Drug use: No   Sexual activity: Not on file  Other Topics Concern   Not on file  Social History Narrative   Not on file   Social Determinants of Health    Financial Resource Strain: Low Risk  (04/25/2023)   [facility], [facility]   Overall Financial Resource Strain (CARDIA)    Difficulty of Paying Living Expenses: Not very hard  Food Insecurity: No Food Insecurity (04/25/2023)   Received from [facility], [facility]   Hunger Vital Sign    Worried About Running Out of Food in the Last Year: Never true    Ran Out of Food in the Last Year: Never true  Transportation Needs: No Transportation Needs (04/25/2023)   Received from [facility], [facility]   [facility] - Transportation    Lack of Transportation (Medical): No    Lack of Transportation (Non-Medical): No  Physical Activity: Not on file  Stress: Not on file  Social Connections: Not on file  Intimate Partner Violence: Not on file    FH:  Family History  Problem Relation Age of Onset   Cancer Mother    Hypertension Mother    Diabetes Mother    Heart disease Father    Diabetes Father    Cancer Sister    Breast cancer Sister 50   Heart disease Brother     Past Medical History:  Diagnosis Date   Arthritis    CHF (congestive heart failure) (HCC)    COPD (chronic obstructive pulmonary disease) (HCC)    Diabetes mellitus  without complication (HCC)    GERD (gastroesophageal reflux disease)    Hyperlipemia    Hypertension    Myocardial infarction (HCC)    light heart attack ?when   Neuropathy    Sleep apnea     Current Outpatient Medications  Medication Sig Dispense Refill   acetaminophen (TYLENOL) 500 MG tablet Take 500 mg by mouth every 6 (six) hours as needed (back pain).     dapagliflozin propanediol (FARXIGA) 10 MG TABS tablet Take 1 tablet (10 mg total) by mouth daily before breakfast. 90 tablet 3   DULoxetine (CYMBALTA) 20 MG capsule Take 1 capsule (20 mg total) by mouth daily. 30 capsule 3   fluticasone (FLONASE) 50 MCG/ACT nasal spray Place 2 sprays into both nostrils daily. 16 g 12   gabapentin (NEURONTIN) 300 MG capsule Start  taking 1 cap BID for 3-4 days, then 1 cap in the AM and 2 caps in the PM for 3-4 days, then 2 caps BID for 3-4 days, then 2 caps in the AM and 3 caps in the PM 150 capsule 3   insulin glargine (LANTUS) 100 UNIT/ML Solostar Pen Inject 20 Units into the skin daily. 15 mL 0   Insulin Pen Needle 34G X 3.5 MM MISC 1 Dose by Does not apply route daily. 100 each 0   insulin regular (NOVOLIN R) 100 units/mL injection Inject 4 Units into the skin 2 (two) times daily before a meal.     metoprolol succinate (TOPROL-XL) 25 MG 24 hr tablet Take 1 tablet (25 mg total) by mouth daily. 180 tablet 0   Multiple Vitamin (MULTIVITAMIN WITH MINERALS) TABS tablet Take 1 tablet by mouth daily.     nortriptyline (PAMELOR) 25 MG capsule Take 50 mg by mouth daily.     omeprazole (PRILOSEC) 20 MG capsule Take 1 capsule (20 mg total) by mouth daily. 90 capsule 3   ONETOUCH VERIO test strip 1 each by Other route 3 (three) times daily. 100 each 12   rosuvastatin (CRESTOR) 10 MG tablet Take 10 mg by mouth at bedtime.     sacubitril-valsartan (ENTRESTO) 24-26 MG Take 1 tablet by mouth 2 (two) times daily. 60 tablet 3   Secukinumab (COSENTYX [place]) Inject into the skin every 30 (thirty) days. Last date give was the week of 11/25/22. Pt unsure of exact date.     Semaglutide,0.25 or 0.5MG /DOS, (OZEMPIC, 0.25 OR 0.5 MG/DOSE,) 2 MG/3ML SOPN Inject 0.25 mg into the skin once a week for 28 days, THEN 0.5 mg once a week for 28 days.     spironolactone (ALDACTONE) 25 MG tablet Take 1/2 (one-half) tablet by mouth once daily 15 tablet 5   triamcinolone ointment (KENALOG) 0.5 % Apply 1 Application topically daily as needed.     vitamin B-12 (CYANOCOBALAMIN) 1000 MCG tablet Take 1,000 mcg by mouth daily.     No current facility-administered medications for this visit.   Vitals:   10/29/23 1402  BP: (!) 142/59  Pulse: 76  Resp: 18  SpO2: 96%  Weight: 189 lb (85.7 kg)   Wt Readings from Last 3 Encounters:  10/29/23 189 lb (85.7 kg)   09/25/23 187 lb 4 oz (84.9 kg)  09/15/23 186 lb (84.4 kg)   Lab Results  Component Value Date   CREATININE 1.38 (H) 10/14/2023   CREATININE 1.54 (H) 08/05/2023   CREATININE 1.74 (H) 05/08/2023   PHYSICAL EXAM:  General:  Well appearing. No resp difficulty HEENT: normal Neck: supple. JVP flat. No lymphadenopathy or  thryomegaly appreciated. Cor: PMI normal. Regular rate & rhythm. No rubs, gallops or murmurs. Lungs: clear Abdomen: soft, nontender, nondistended. No hepatosplenomegaly. No bruits or masses. Extremities: no cyanosis, clubbing, rash. 1+ edema left lower leg; trace pitting in right lower leg Neuro: alert & oriented x3, cranial nerves grossly intact. Moves all 4 extremities w/o difficulty. Affect pleasant.   ECG: not done   ASSESSMENT & PLAN:  NICM with now preserved ejection fraction- - likely due to to HTN/ COPD - NYHA class II - euvolemic - weighing most days; reviewed the importance of weighing daily and to call for an overnight weight gain of > 2 pounds or a weekly weight gain of > 5 pounds - weight up 2 pounds from last visit here 1 month ago - echo 01/28/22: EF 40-45% LV with mildly decreased function and global hypokinesis with mild LVH - echo 08/20/23: EF 60-65% with mild LVH, Grade I DD (reviewed this with patient today) - reviewed keeping daily fluid intake to 60-64 ounces - reviewed keeping daily sodium intake to 2000mg   - continue entresto 24/26mg  BID - continue farxiga 10mg  daily - continue metoprolol succinate 25mg  daily - continue spironolactone 12.5mg  daily (titrate this at next visit if BP is still a little elevated) - not adding salt to her food but does like to eat out on occasion - elevate legs when sitting for long periods of time; she was unable to tolerate compression socks - saw cardiology Mellissa Kohut) 09/24 - encouraged as much activity as possible - BNP 01/27/22 721.9  Hypertension- - BP 142/59 - saw PCP [name]) 09/24 - BMP 10/14/23  reviewed and showed sodium 143, potassium 4.1, creatinine 1.38 and GFR 39  COPD- - saw pulmonologist Meredeth Ide) 06/24 - smoking 1 pack every 2-3 days; does not smoke with oxygen on - continued cessation discussed for 3 minutes - on O2 @ 2L at bedtime and PRN during the day; says that she doesn't wear it much during the day - pulmonary rehab referral placed as she is interested although is concerned about the time commitment but is willing to discuss this with them  DM- - A1c 04/24/23 was 7.9%  - ozempic 2mg  weekly (had been off for 1 month due to cost and resumed it 3 weeks ago) - saw nephrology Cherylann Ratel) 09/24  Return in 2 months, sooner if needed.

## 2023-10-29 NOTE — Patient Instructions (Signed)
Can take over the counter zyrtec, claritin or xyzal to dry up the nasal drainage.

## 2023-10-31 MED ORDER — ONETOUCH ULTRA VI STRP: 1.0000 | ORAL_STRIP | Freq: Every day | 3 refills | Status: DC

## 2023-10-31 MED ORDER — ONETOUCH ULTRASOFT LANCETS MISC
1.0000 | Freq: Every day | 3 refills | Status: DC
Start: 2023-10-31 — End: 2024-07-08

## 2023-10-31 MED ORDER — ONETOUCH ULTRA 2 W/DEVICE KIT: 1.0000 | PACK | Freq: Every day | 0 refills | Status: DC

## 2023-10-31 NOTE — Telephone Encounter (Signed)
OK to write DME and I'll sign

## 2023-10-31 NOTE — Telephone Encounter (Signed)
Prescriptions t'd up to be sent in for the patient.

## 2023-11-05 ENCOUNTER — Other Ambulatory Visit: Payer: Medicare Other

## 2023-11-05 NOTE — Progress Notes (Signed)
   11/05/2023  Patient ID: Kelsey Mcdowell, female   DOB: February 25, 1946, 77 y.o.   MRN: [id number]  S/O Telephone visit to follow-up on management of T2DM and assist with PAP renewals for 2025  Diabetes Management Plan -Current medications:  Ozempic 1mg  weekly, Lantus 20 units daily, Humulin R BID based on BG, Farxiga 10mg  daily  -Patient continued with Ozempic 0.25mg  weekly since our last telephone visit- did not increase to 0.5mg  weekly -Has received Ozempic 1mg  from [employer] PAP and took first dose today -FBG 73-150, post-prandial >200 -Endorses 1 symptomatic occurrence of hypoglycemia where BG was 63 -A1c 08/05/23 was 8.3%  Medication Management -Currently enrolled in the following PAPs:   [employer] for Ozempic 1mg - enrolled through 12/23/23- prescribed by Dr. [name] [employer] for [name] 10mg - enrolled through 12/23/23- prescribed by [name], FNP [employer] for [facility] of enrollment end date or prescriber [employer] for [employer] 24/26mg  BID-enrolled through 12/23/23- prescribed by [name], FNP  A/P  Diabetes Management Plan -Currently uncontrolled -Started Ozempic 1mg  weekly today; recommended decreasing Lantus to 16 units daily since this is an increase from Ozempic 0.25mg  -Patient sees Dr. Ian Malkin again 11/26 and is due for A1c; likely to be >7%, and we will titrate Ozempic up to 2mg  in 4 weeks if she is tolerating 1mg  well -Continue to monitor and record BG regularly  Medication Management -Submitting [employer] PAP renewal for 2025 online -Contacting [name], FNP to see if office is working on renewals for [employer] of if they would like my assistance with this -Contacting Dr. [name] to get provider information for Cosentyx, so I can check on PAP with [employer]  Follow-up:  12/11  [name], PharmD, DPLA

## 2023-11-10 NOTE — Progress Notes (Signed)
   11/10/2023  Patient ID: Kelsey Mcdowell, female   DOB: 07-30-1946, 77 y.o.   MRN: [id number]  Submitted Ozempic renewal application for 2025 online.  Contacted prescriber for [name] and [name], FNP), and that office has not yet started working on 2025 renewals for these medications.  I have completed patient and provider portions for Entresto and Farxiga PAP and am contacting patient to come in to sign patient portion and will fax provider portion to [facility], [place].  Checking on need for Cosentyx renewal and whom provider for this medication is.

## 2023-11-11 NOTE — Progress Notes (Signed)
   11/11/2023  Patient ID: Kelsey Mcdowell, female   DOB: 07-17-1946, 77 y.o.   MRN: [id number]  Outreach to request that patient bring most recent proof of income for Entresto PAP application.  She has not received anything yet for this year but is going to find the one for last year.  [name], PharmD, DPLA

## 2023-11-12 ENCOUNTER — Other Ambulatory Visit: Payer: Self-pay

## 2023-11-13 NOTE — Progress Notes (Signed)
   11/13/2023  Patient ID: Kelsey Mcdowell, female   DOB: 1946/12/04, 77 y.o.   MRN: [id number]  Patient presenting for assistance in completing PAP renewal applications for 2025  -She is currently enrolled in PAP for Ozempic, Farxiga, Entresto, and Cosentyx -Cosentyx is managed by dermatology, and she has reached out to the practice for program renewal assistance with this medication. -[name] and [name] are prescribed by [name] with cardiology, and she is aware I am assisting patient with these program renewals.  [name] completed and faxed to [employer]- renewal does not require provider portion. Patient portion of Entresto completed; and I am faxing to provider office to complete provide portion and fax into programs. -Dr. [name] prescribes Ozempic, and 2025 renewal application has already been submitted to this program.  [name], PharmD, DPLA

## 2023-11-14 ENCOUNTER — Other Ambulatory Visit: Payer: Self-pay | Admitting: Family Medicine

## 2023-11-17 NOTE — Telephone Encounter (Signed)
Requested Prescriptions  Pending Prescriptions Disp Refills   DULoxetine (CYMBALTA) 20 MG capsule [Pharmacy Med Name: DULoxetine HCl 20 MG Oral Capsule Delayed Release Particles] 30 capsule 0    Sig: Take 1 capsule by mouth once daily     Psychiatry: Antidepressants - SNRI - duloxetine Failed - 11/14/2023 10:23 AM      Failed - Cr in normal range and within 360 days    Creatinine, Ser  Date Value Ref Range Status  10/14/2023 1.38 (H) 0.57 - 1.00 mg/dL Final   Creatinine, Urine  Date Value Ref Range Status  06/04/2023 43  Final         Failed - Last BP in normal range    BP Readings from Last 1 Encounters:  10/29/23 (!) 154/66         Passed - eGFR is 30 or above and within 360 days    GFR calc Af Amer  Date Value Ref Range Status  07/07/2017 52 (L) >60 mL/min Final    Comment:    (NOTE) The eGFR has been calculated using the CKD EPI equation. This calculation has not been validated in all clinical situations. eGFR's persistently <60 mL/min signify possible Chronic Kidney Disease.    GFR, Estimated  Date Value Ref Range Status  01/16/2023 31 (L) >60 mL/min Final    Comment:    (NOTE) Calculated using the CKD-EPI Creatinine Equation (2021)    eGFR  Date Value Ref Range Status  10/14/2023 39 (L) >59 mL/min/1.73 Final         Passed - Completed PHQ-2 or PHQ-9 in the last 360 days      Passed - Valid encounter within last 6 months    Recent Outpatient Visits           2 months ago Chronic diarrhea   [place] [facility] [place], Megan P, DO   3 months ago Type 2 diabetes mellitus with cardiac complication [facility])   [place] [facility] [place], Megan P, DO   5 months ago Encounter for annual wellness exam in Medicare patient   [place] [facility] [place], [place] P, DO   6 months ago Controlled type 2 diabetes mellitus without complication, with long-term current use of insulin (HCC)   [place] [facility] [place], Megan P, DO   6 months ago Acute kidney injury superimposed on chronic kidney disease [facility])   [place] [facility] [place], Oralia Rud, DO       Future Appointments             Tomorrow Dorcas Carrow, DO [place] [facility], [facility]

## 2023-11-18 ENCOUNTER — Encounter: Payer: Self-pay | Admitting: *Deleted

## 2023-11-18 ENCOUNTER — Ambulatory Visit: Payer: Medicare Other | Admitting: Family Medicine

## 2023-11-24 ENCOUNTER — Other Ambulatory Visit: Payer: Self-pay | Admitting: Family Medicine

## 2023-11-24 ENCOUNTER — Other Ambulatory Visit: Payer: Self-pay | Admitting: Family

## 2023-11-25 ENCOUNTER — Other Ambulatory Visit: Payer: Self-pay

## 2023-11-27 NOTE — Telephone Encounter (Signed)
Requested Prescriptions  Refused Prescriptions Disp Refills   DULoxetine (CYMBALTA) 20 MG capsule [Pharmacy Med Name: DULoxetine HCl 20 MG Oral Capsule Delayed Release Particles] 30 capsule 0    Sig: Take 1 capsule by mouth once daily     Psychiatry: Antidepressants - SNRI - duloxetine Failed - 11/24/2023 11:04 AM      Failed - Cr in normal range and within 360 days    Creatinine, Ser  Date Value Ref Range Status  10/14/2023 1.38 (H) 0.57 - 1.00 mg/dL Final   Creatinine, Urine  Date Value Ref Range Status  06/04/2023 43  Final         Failed - Last BP in normal range    BP Readings from Last 1 Encounters:  10/29/23 (!) 154/66         Passed - eGFR is 30 or above and within 360 days    GFR calc Af Amer  Date Value Ref Range Status  07/07/2017 52 (L) >60 mL/min Final    Comment:    (NOTE) The eGFR has been calculated using the CKD EPI equation. This calculation has not been validated in all clinical situations. eGFR's persistently <60 mL/min signify possible Chronic Kidney Disease.    GFR, Estimated  Date Value Ref Range Status  01/16/2023 31 (L) >60 mL/min Final    Comment:    (NOTE) Calculated using the CKD-EPI Creatinine Equation (2021)    eGFR  Date Value Ref Range Status  10/14/2023 39 (L) >59 mL/min/1.73 Final         Passed - Completed PHQ-2 or PHQ-9 in the last 360 days      Passed - Valid encounter within last 6 months    Recent Outpatient Visits           2 months ago Chronic diarrhea   [place] [facility] [place], Megan P, DO   3 months ago Type 2 diabetes mellitus with cardiac complication [facility])   [place] [facility] [place], Megan P, DO   5 months ago Encounter for annual wellness exam in Medicare patient   [place] [facility] [place], [place] P, DO   6 months ago Controlled type 2 diabetes mellitus without complication, with long-term current use of insulin (HCC)   [place] [facility], Megan P, DO   7 months ago Acute kidney injury superimposed on chronic kidney disease [facility])   [facility] [place], Megan P, DO

## 2023-12-03 ENCOUNTER — Other Ambulatory Visit: Payer: Self-pay | Admitting: Family Medicine

## 2023-12-03 ENCOUNTER — Other Ambulatory Visit: Payer: Self-pay

## 2023-12-03 NOTE — Progress Notes (Signed)
   12/03/2023  Patient ID: Kelsey Mcdowell, female   DOB: 1946/05/21, 77 y.o.   MRN: [id number]  Patient outreach to follow-up on progress of 2025 re-enrollment applications for patient assistance programs.  Kelsey Mcdowell has not received any communication from the programs, so I contacted them for updates.  -[employer] states the [name] application was missing the patient's insurance information, but the representative was able to see this was included.  The application is now in the benefit verifcation phase, which takes 2-3 days. -[employer] state PAP for Kelsey Mcdowell has been approved through 12/22/2024 -[employer] state they cannot process re-enrollment for Kelsey Mcdowell until they receive a LIS denial letter.  I will contact the patient, so we can complete LIS application online over the phone next week.  [name], PharmD, DPLA

## 2023-12-03 NOTE — Telephone Encounter (Signed)
Medication Refill -  Most Recent Primary Care Visit:  Provider: [facility]  Department[facility]  Visit Type: LAB Date: 10/14/2023  2nd request Medication: DULoxetine (CYMBALTA) 20 MG capsule   Has the patient contacted their pharmacy? yes (Agent: If yes, when and what did the pharmacy advise?)contact pcp She only received 1 of the other medications and not this one  Is this the correct pharmacy for this prescription? yes  This is the patient's preferred pharmacy:  [facility] [address], [place] - [address] [place] [address] Phone: [phone] Fax: [phone]   Has the prescription been filled recently? no  Is the patient out of the medication? yes  Has the patient been seen for an appointment in the last year OR does the patient have an upcoming appointment? yes  Can we respond through MyChart? yes  Agent: Please be advised that Rx refills may take up to 3 business days. We ask that you follow-up with your pharmacy.

## 2023-12-04 NOTE — Telephone Encounter (Signed)
Requested medication (s) are due for refill today:   Not sure  Requested medication (s) are on the active medication list:   Yes  Future visit scheduled:   Yes tomorrow 12/13 with Dr. [name] for 10:00   Last ordered: 11/27/2023 #30, 0 refills and 11/17/2023 #90, 0 refills.    She did cancel her appt for 11/18/2023   Returned for provider to review during appt tomorrow.     Requested Prescriptions  Pending Prescriptions Disp Refills   DULoxetine (CYMBALTA) 20 MG capsule 90 capsule 0    Sig: Take 1 capsule (20 mg total) by mouth daily.     Psychiatry: Antidepressants - SNRI - duloxetine Failed - 12/04/2023  8:51 AM      Failed - Cr in normal range and within 360 days    Creatinine, Ser  Date Value Ref Range Status  10/14/2023 1.38 (H) 0.57 - 1.00 mg/dL Final   Creatinine, Urine  Date Value Ref Range Status  06/04/2023 43  Final         Failed - Last BP in normal range    BP Readings from Last 1 Encounters:  10/29/23 (!) 154/66         Passed - eGFR is 30 or above and within 360 days    GFR calc Af Amer  Date Value Ref Range Status  07/07/2017 52 (L) >60 mL/min Final    Comment:    (NOTE) The eGFR has been calculated using the CKD EPI equation. This calculation has not been validated in all clinical situations. eGFR's persistently <60 mL/min signify possible Chronic Kidney Disease.    GFR, Estimated  Date Value Ref Range Status  01/16/2023 31 (L) >60 mL/min Final    Comment:    (NOTE) Calculated using the CKD-EPI Creatinine Equation (2021)    eGFR  Date Value Ref Range Status  10/14/2023 39 (L) >59 mL/min/1.73 Final         Passed - Completed PHQ-2 or PHQ-9 in the last 360 days      Passed - Valid encounter within last 6 months    Recent Outpatient Visits           2 months ago Chronic diarrhea   [place] [facility] [place], Megan P, DO   4 months ago Type 2 diabetes mellitus with cardiac complication [facility])   [place] [facility] [place], Megan P, DO   5 months ago Encounter for annual wellness exam in Medicare patient   [place] [facility] [place], Megan P, DO   7 months ago Controlled type 2 diabetes mellitus without complication, with long-term current use of insulin (HCC)   [place] [facility] [place], Megan P, DO   7 months ago Acute kidney injury superimposed on chronic kidney disease [facility])   [place] [facility] [place], Oralia Rud, DO       Future Appointments             Tomorrow Dorcas Carrow, DO [place] [facility], [facility]

## 2023-12-05 ENCOUNTER — Ambulatory Visit (INDEPENDENT_AMBULATORY_CARE_PROVIDER_SITE_OTHER): Payer: Medicare Other | Admitting: Family Medicine

## 2023-12-05 ENCOUNTER — Encounter: Payer: Self-pay | Admitting: Family Medicine

## 2023-12-05 VITALS — BP 155/72 | HR 80 | Wt 188.2 lb

## 2023-12-05 DIAGNOSIS — I10 Essential (primary) hypertension: Secondary | ICD-10-CM

## 2023-12-05 DIAGNOSIS — E1159 Type 2 diabetes mellitus with other circulatory complications: Secondary | ICD-10-CM | POA: Diagnosis not present

## 2023-12-05 DIAGNOSIS — F321 Major depressive disorder, single episode, moderate: Secondary | ICD-10-CM

## 2023-12-05 DIAGNOSIS — K529 Noninfective gastroenteritis and colitis, unspecified: Secondary | ICD-10-CM

## 2023-12-05 LAB — BAYER DCA HB A1C WAIVED: HB A1C (BAYER DCA - WAIVED): 7 % — ABNORMAL HIGH (ref 4.8–5.6)

## 2023-12-05 MED ORDER — GABAPENTIN 300 MG PO CAPS: ORAL_CAPSULE | ORAL | 1 refills | Status: DC

## 2023-12-05 MED ORDER — DULOXETINE HCL 20 MG PO CPEP: 20.0000 mg | ORAL_CAPSULE | Freq: Every day | ORAL | 1 refills | Status: DC

## 2023-12-05 MED ORDER — TRAZODONE HCL 50 MG PO TABS: 25.0000 mg | ORAL_TABLET | Freq: Every evening | ORAL | 3 refills | Status: DC | PRN

## 2023-12-05 MED ORDER — METOPROLOL SUCCINATE ER 25 MG PO TB24: 25.0000 mg | ORAL_TABLET | Freq: Every day | ORAL | 1 refills | Status: DC

## 2023-12-05 MED ORDER — ROSUVASTATIN CALCIUM 10 MG PO TABS: 10.0000 mg | ORAL_TABLET | Freq: Every day | ORAL | 1 refills | Status: DC

## 2023-12-05 NOTE — Progress Notes (Signed)
BP (!) 155/72   Pulse 80   Wt 188 lb 3.2 oz (85.4 kg)   SpO2 97%   BMI 36.15 kg/m    Subjective:    Patient ID: Kelsey Mcdowell, female    DOB: 01/05/1946, 77 y.o.   MRN: [id number]  HPI: Kelsey Mcdowell is a 77 y.o. female  Chief Complaint  Patient presents with   Diabetes    Patient says here recently for a couple of days, she noticed an elevation in her sugar reading, almost as high as 300. Patient says it did not last long, but about three days.    Has been having bloating and nausea since Tuesday, she has been having diarrhea. She was feeling better yesterday, but then started feeling worse again today. She is thinking she might have a GI bug. She had been doing well until Tuesday with GI symptoms off and on. She is due to see GI 01/27/24.  DIABETES Hypoglycemic episodes:no Polydipsia/polyuria: no Visual disturbance: no Chest pain: no Paresthesias: no Glucose Monitoring: yes  Accucheck frequency: Daily Taking Insulin?: yes  Long acting insulin: 16 units a day Blood Pressure Monitoring: rarely Retinal Examination: Up to Date Foot Exam: Up to Date Diabetic Education: Completed Pneumovax: Up to Date Influenza: Up to Date Aspirin: no  HYPERTENSION  Hypertension status: exacerbated  Satisfied with current treatment? yes Duration of hypertension: chronic BP monitoring frequency:  rarely BP medication side effects:  no Medication compliance: excellent compliance Aspirin: no Recurrent headaches: no Visual changes: no Palpitations: no Dyspnea: no Chest pain: no Lower extremity edema: no Dizzy/lightheaded: no  DEPRESSION Mood status: controlled Satisfied with current treatment?: yes Symptom severity: mild  Duration of current treatment : chronic Side effects: no Medication compliance: excellent compliance Psychotherapy/counseling: no  Previous psychiatric medications: cymbalta Depressed mood: no Anxious mood: no Anhedonia:  no Significant weight loss or gain: no Insomnia: no  Fatigue: yes Feelings of worthlessness or guilt: no Impaired concentration/indecisiveness: no Suicidal ideations: no Hopelessness: no Crying spells: no    12/05/2023   11:07 AM 09/15/2023    3:13 PM 08/05/2023   10:17 AM 06/12/2023   10:58 AM 05/08/2023   10:46 AM  Depression screen PHQ 2/9  Decreased Interest 0 0 1 0 0  Down, Depressed, Hopeless 0 1 0 0 0  PHQ - 2 Score 0 1 1 0 0  Altered sleeping 2  1 1 3   Tired, decreased energy 2 2 1 1 3   Change in appetite 0 0 0 0 0  Feeling bad or failure about yourself  0  1 0 0  Trouble concentrating 0 0 0 1 0  Moving slowly or fidgety/restless 0 0 0 0 2  Suicidal thoughts 0 0 0 0 1  PHQ-9 Score 4  4 3 9   Difficult doing work/chores Not difficult at all Not difficult at all Not difficult at all Not difficult at all Somewhat difficult      Relevant past medical, surgical, family and social history reviewed and updated as indicated. Interim medical history since our last visit reviewed. Allergies and medications reviewed and updated.  Review of Systems  Constitutional: Negative.   Respiratory: Negative.    Cardiovascular: Negative.   Gastrointestinal:  Positive for abdominal distention, diarrhea and nausea. Negative for abdominal pain, anal bleeding, blood in stool, constipation, rectal pain and vomiting.  Musculoskeletal: Negative.   Skin: Negative.   Psychiatric/Behavioral: Negative.      Per HPI unless specifically indicated above  Objective:    BP (!) 155/72   Pulse 80   Wt 188 lb 3.2 oz (85.4 kg)   SpO2 97%   BMI 36.15 kg/m   Wt Readings from Last 3 Encounters:  12/05/23 188 lb 3.2 oz (85.4 kg)  10/29/23 189 lb (85.7 kg)  09/25/23 187 lb 4 oz (84.9 kg)    Physical Exam Vitals and nursing note reviewed.  Constitutional:      General: She is not in acute distress.    Appearance: Normal appearance. She is obese. She is not ill-appearing, toxic-appearing or  diaphoretic.  HENT:     Head: Normocephalic and atraumatic.     Right Ear: External ear normal.     Left Ear: External ear normal.     Nose: Nose normal.     Mouth/Throat:     Mouth: Mucous membranes are moist.     Pharynx: Oropharynx is clear.  Eyes:     General: No scleral icterus.       Right eye: No discharge.        Left eye: No discharge.     Extraocular Movements: Extraocular movements intact.     Conjunctiva/sclera: Conjunctivae normal.     Pupils: Pupils are equal, round, and reactive to light.  Cardiovascular:     Rate and Rhythm: Normal rate and regular rhythm.     Pulses: Normal pulses.     Heart sounds: Normal heart sounds. No murmur heard.    No friction rub. No gallop.  Pulmonary:     Effort: Pulmonary effort is normal. No respiratory distress.     Breath sounds: Normal breath sounds. No stridor. No wheezing, rhonchi or rales.  Chest:     Chest wall: No tenderness.  Musculoskeletal:        General: Normal range of motion.     Cervical back: Normal range of motion and neck supple.  Skin:    General: Skin is warm and dry.     Capillary Refill: Capillary refill takes less than 2 seconds.     Coloration: Skin is not jaundiced or pale.     Findings: No bruising, erythema, lesion or rash.  Neurological:     General: No focal deficit present.     Mental Status: She is alert and oriented to person, place, and time. Mental status is at baseline.  Psychiatric:        Mood and Affect: Mood normal.        Behavior: Behavior normal.        Thought Content: Thought content normal.        Judgment: Judgment normal.     Results for orders placed or performed in visit on 10/14/23  QuantiFERON-TB Gold Plus   Collection Time: 10/14/23 10:05 AM  Result Value Ref Range   QuantiFERON Incubation Incubation performed.    QuantiFERON Criteria Comment    QuantiFERON TB1 Ag Value 0.07 IU/mL   QuantiFERON TB2 Ag Value 0.06 IU/mL   QuantiFERON Nil Value 0.08 IU/mL    QuantiFERON Mitogen Value >10.00 IU/mL   QuantiFERON-TB Gold Plus Negative Negative  Comprehensive metabolic panel   Collection Time: 10/14/23 10:05 AM  Result Value Ref Range   Glucose 203 (H) 70 - 99 mg/dL   BUN 14 8 - 27 mg/dL   Creatinine, Ser 1.61 (H) 0.57 - 1.00 mg/dL   eGFR 39 (L) >09 UE/AVW/0.98   BUN/Creatinine Ratio 10 (L) 12 - 28   Sodium 143 134 - 144 mmol/L   Potassium  4.1 3.5 - 5.2 mmol/L   Chloride 106 96 - 106 mmol/L   CO2 22 20 - 29 mmol/L   Calcium 8.8 8.7 - 10.3 mg/dL   Total Protein 6.6 6.0 - 8.5 g/dL   Albumin 3.8 3.8 - 4.8 g/dL   Globulin, Total 2.8 1.5 - 4.5 g/dL   Bilirubin Total 0.3 0.0 - 1.2 mg/dL   Alkaline Phosphatase 62 44 - 121 IU/L   AST 14 0 - 40 IU/L   ALT 8 0 - 32 IU/L  CBC with Differential/Platelet   Collection Time: 10/14/23 10:05 AM  Result Value Ref Range   WBC 6.1 3.4 - 10.8 x10E3/uL   RBC 3.76 (L) 3.77 - 5.28 x10E6/uL   Hemoglobin 11.7 11.1 - 15.9 g/dL   Hematocrit 91.4 78.2 - 46.6 %   MCV 100 (H) 79 - 97 fL   MCH 31.1 26.6 - 33.0 pg   MCHC 31.0 (L) 31.5 - 35.7 g/dL   RDW 95.6 21.3 - 08.6 %   Platelets 221 150 - 450 x10E3/uL   Neutrophils 44 Not Estab. %   Lymphs 45 Not Estab. %   Monocytes 7 Not Estab. %   Eos 3 Not Estab. %   Basos 1 Not Estab. %   Neutrophils Absolute 2.7 1.4 - 7.0 x10E3/uL   Lymphocytes Absolute 2.7 0.7 - 3.1 x10E3/uL   Monocytes Absolute 0.4 0.1 - 0.9 x10E3/uL   EOS (ABSOLUTE) 0.2 0.0 - 0.4 x10E3/uL   Basophils Absolute 0.1 0.0 - 0.2 x10E3/uL   Immature Granulocytes 0 Not Estab. %   Immature Grans (Abs) 0.0 0.0 - 0.1 x10E3/uL  Stool culture   Collection Time: 10/17/23 11:32 AM   Specimen: Stool   ST     CD- [id number]  Result Value Ref Range   Salmonella/Shigella Screen Final report    Stool Culture result 1 (RSASHR) Comment    Campylobacter Culture Final report    Stool Culture result 1 (CMPCXR) Comment    E coli, Shiga toxin Assay Negative Negative  Fecal leukocytes   Collection Time: 10/17/23  11:32 AM   Specimen: Stool   ST     CD- [id number] V  Result Value Ref Range   White Blood Cells (WBC), Stool Final report None Seen   Result 1 Comment   Clostridium difficile EIA   Collection Time: 10/17/23 11:32 AM   ST     CD- [id number] V  Result Value Ref Range   C difficile Toxins A+B, EIA Negative Negative  Fecal occult blood, imunochemical   Collection Time: 10/17/23 11:32 AM   ST     CD- [id number] V  Result Value Ref Range   Fecal Occult Bld Negative Negative  Ova and parasite examination   Collection Time: 10/17/23 11:32 AM   ST     CD- [id number] V  Result Value Ref Range   OVA + PARASITE EXAM Final report    O&P result 1 Comment       Assessment & Plan:   Problem List Items Addressed This Visit       Cardiovascular and Mediastinum   Type 2 diabetes mellitus with cardiac complication (HCC) - Primary   Greatly improved with A1c down to 7.0 down from 8.3. Diarrhea acting up this week- this is chronic and unlikely related to her ozempic, but it may be worsening her symptoms. Will hold on increasing her dose until she sees GI in February. If diarrhea improved, plan would be to increase her ozempic to 2 to get her off  insulin.       Relevant Medications   metoprolol succinate (TOPROL-XL) 25 MG 24 hr tablet   rosuvastatin (CRESTOR) 10 MG tablet   Other Relevant Orders   Bayer DCA Hb A1c Waived   Hypertension   Running high. She is hesitant to start new medicine. Will monitor at home and call with any concerns. Seeing CHF clinic 12/31/23. Continue to monitor.       Relevant Medications   metoprolol succinate (TOPROL-XL) 25 MG 24 hr tablet   rosuvastatin (CRESTOR) 10 MG tablet     Digestive   Chronic diarrhea   Due to see GI in early February. Continue to monitor. Hold on increasing GLP-1. Diarrhea did not change when she was off of it for several months, so do not think it is contributing. Call with any concerns.         Other   Depression, major, single episode,  moderate (HCC)   Under good control on current regimen. Continue current regimen. Continue to monitor. Call with any concerns. Refills given.        Relevant Medications   traZODone (DESYREL) 50 MG tablet   DULoxetine (CYMBALTA) 20 MG capsule     Follow up plan: Return in about 3 months (around 03/04/2024).

## 2023-12-05 NOTE — Assessment & Plan Note (Signed)
Under good control on current regimen. Continue current regimen. Continue to monitor. Call with any concerns. Refills given.   

## 2023-12-05 NOTE — Assessment & Plan Note (Signed)
Running high. She is hesitant to start new medicine. Will monitor at home and call with any concerns. Seeing CHF clinic 12/31/23. Continue to monitor.

## 2023-12-05 NOTE — Assessment & Plan Note (Signed)
Due to see GI in early February. Continue to monitor. Hold on increasing GLP-1. Diarrhea did not change when she was off of it for several months, so do not think it is contributing. Call with any concerns.

## 2023-12-05 NOTE — Assessment & Plan Note (Signed)
Greatly improved with A1c down to 7.0 down from 8.3. Diarrhea acting up this week- this is chronic and unlikely related to her ozempic, but it may be worsening her symptoms. Will hold on increasing her dose until she sees GI in February. If diarrhea improved, plan would be to increase her ozempic to 2 to get her off insulin.

## 2023-12-08 NOTE — Progress Notes (Signed)
   12/08/2023  Patient ID: Kelsey Mcdowell, female   DOB: 11-09-1946, 77 y.o.   MRN: [id number]  Contacted [employer] to follow-up on 2025 re-enrollment application, and her application for Ozempic has been approved through 12/22/2024.  Contacted patient to inform and schedule a time for [place] to apply for [place] Medicare Extra Help online, as this is now required by [employer] PAP for [employer].  [name], PharmD, DPLA

## 2023-12-10 ENCOUNTER — Other Ambulatory Visit: Payer: Self-pay

## 2023-12-10 NOTE — Progress Notes (Signed)
   12/10/2023  Patient ID: Kelsey Mcdowell, female   DOB: [birth year], 77 y.o.   MRN: [id number]  Patient outreach for scheduled telephone visit to complete [place] Medicare Extra Help application online, as Entresto patient assistance program is requiring a denial letter for 2025 approval.  This was completed and submitted online, and patient should receive a letter in the mail from Social Security within 2-4 weeks to approve of decision.  Informed her that if approved, [name] copay will be $12 on insurance; but if denied, she should be approved for PAP.  Telephone visit scheduled to follow-up with patient in 3 weeks to see if she has heard back from [facility].  [name], PharmD, DPLA

## 2023-12-19 ENCOUNTER — Other Ambulatory Visit: Payer: Self-pay

## 2023-12-24 DIAGNOSIS — J449 Chronic obstructive pulmonary disease, unspecified: Secondary | ICD-10-CM | POA: Diagnosis not present

## 2023-12-30 NOTE — Progress Notes (Deleted)
 PCP: Vicci Bouchard, DO (last seen 12/24) Primary Cardiologist: Clarisa Kung PA (last seen 09/24)  Chief Complaint:  HPI:  Kelsey Mcdowell is a 78 yo with a PMHx of COPD, acute on chronic systolic CHF, DM II, tobacco abuse, obesity, and HTN.  Admitted 04/24/23 due to several days of diarrhea, decreased PO intake and worsening weakness. Found to have oliguric AKI. Held furosemide , aldactone , entresto , hydrochlorothiazide, dapagliflozin  during hospital stay.   Echo 01/28/22: EF 40-45% LV with mildly decreased function and global hypokinesis with mild LVH.  Echo 08/20/23: EF 60-65% with mild LVH, Grade I DD  She presents today for a HF follow-up visit with a chief complaint of     ROS: All systems negative except as listed in HPI, PMH and Problem List.  SH:  Social History   Socioeconomic History   Marital status: Widowed    Spouse name: Not on file   Number of children: Not on file   Years of education: Not on file   Highest education level: Not on file  Occupational History   Not on file  Tobacco Use   Smoking status: Every Day    Current packs/day: 0.00    Types: Cigarettes    Last attempt to quit: 12/31/2022    Years since quitting: 0.9   Smokeless tobacco: Never  Vaping Use   Vaping status: Never Used  Substance and Sexual Activity   Alcohol  use: Yes    Comment: occasional   Drug use: No   Sexual activity: Not on file  Other Topics Concern   Not on file  Social History Narrative   Not on file   Social Drivers of Health   Financial Resource Strain: Low Risk  (04/25/2023)   Received from [facility], [facility]   Overall Financial Resource Strain (CARDIA)    Difficulty of Paying Living Expenses: Not very hard  Food Insecurity: No Food Insecurity (04/25/2023)   Received from [facility], [facility]   Hunger Vital Sign    Worried About Running Out of Food in the Last Year: Never true    Ran Out of Food in the Last Year: Never true  Transportation  Needs: No Transportation Needs (04/25/2023)   Received from [facility], [facility]   [facility] - Transportation    Lack of Transportation (Medical): No    Lack of Transportation (Non-Medical): No  Physical Activity: Not on file  Stress: Not on file  Social Connections: Not on file  Intimate Partner Violence: Not on file    FH:  Family History  Problem Relation Age of Onset   Cancer Mother    Hypertension Mother    Diabetes Mother    Heart disease Father    Diabetes Father    Cancer Sister    Breast cancer Sister 2   Heart disease Brother     Past Medical History:  Diagnosis Date   Arthritis    CHF (congestive heart failure) (HCC)    COPD (chronic obstructive pulmonary disease) (HCC)    Diabetes mellitus without complication (HCC)    GERD (gastroesophageal reflux disease)    Hyperlipemia    Hypertension    Myocardial infarction (HCC)    light heart attack ?when   Neuropathy    Sleep apnea     Current Outpatient Medications  Medication Sig Dispense Refill   acetaminophen  (TYLENOL ) 500 MG tablet Take 500 mg by mouth every 6 (six) hours as needed (back pain).  Blood Glucose Monitoring Suppl (ONE TOUCH ULTRA 2) w/Device KIT 1 each by Does not apply route daily at 2 PM. 1 kit 0   dapagliflozin  propanediol (FARXIGA ) 10 MG TABS tablet Take 1 tablet (10 mg total) by mouth daily before breakfast. 90 tablet 3   DULoxetine  (CYMBALTA ) 20 MG capsule Take 1 capsule (20 mg total) by mouth daily. 90 capsule 1   fluticasone  (FLONASE ) 50 MCG/ACT nasal spray Place 2 sprays into both nostrils daily. 16 g 12   gabapentin  (NEURONTIN ) 300 MG capsule Take 1 cap in the AM and 2 in the PM 270 capsule 1   glucose blood (ONETOUCH ULTRA) test strip 1 each by Other route daily at 2 PM. Use as instructed 100 each 3   insulin  glargine (LANTUS ) 100 UNIT/ML Solostar Pen Inject 20 Units into the skin daily. (Patient taking differently: Inject 16 Units into the skin daily.) 15 mL 0   Insulin   Pen Needle 34G X 3.5 MM MISC 1 Dose by Does not apply route daily. 100 each 0   Lancets (ONETOUCH ULTRASOFT) lancets 1 each by Other route daily at 2 PM. Use as instructed 100 each 3   metoprolol  succinate (TOPROL -XL) 25 MG 24 hr tablet Take 1 tablet (25 mg total) by mouth daily. 180 tablet 1   Multiple Vitamin (MULTIVITAMIN WITH MINERALS) TABS tablet Take 1 tablet by mouth daily.     nortriptyline  (PAMELOR ) 25 MG capsule Take 50 mg by mouth daily.     omeprazole  (PRILOSEC) 20 MG capsule Take 1 capsule (20 mg total) by mouth daily. 90 capsule 3   ONETOUCH VERIO test strip 1 each by Other route 3 (three) times daily. 100 each 12   rosuvastatin  (CRESTOR ) 10 MG tablet Take 1 tablet (10 mg total) by mouth at bedtime. 90 tablet 1   sacubitril -valsartan  (ENTRESTO ) 24-26 MG Take 1 tablet by mouth 2 (two) times daily. 60 tablet 3   Secukinumab (COSENTYX [place]) Inject into the skin every 30 (thirty) days. Last date give was the week of 11/25/22. Pt unsure of exact date.     Semaglutide , 1 MG/DOSE, (OZEMPIC , 1 MG/DOSE,) 4 MG/3ML SOPN Inject 1 mg into the skin once a week.     spironolactone  (ALDACTONE ) 25 MG tablet Take 1/2 (one-half) tablet by mouth once daily 15 tablet 5   traZODone  (DESYREL ) 50 MG tablet Take 0.5-1 tablets (25-50 mg total) by mouth at bedtime as needed for sleep. 30 tablet 3   triamcinolone  ointment (KENALOG ) 0.5 % Apply 1 Application topically daily as needed.     vitamin B-12 (CYANOCOBALAMIN) 1000 MCG tablet Take 1,000 mcg by mouth daily.     No current facility-administered medications for this visit.     PHYSICAL EXAM:  General:  Well appearing. No resp difficulty HEENT: normal Neck: supple. JVP flat. No lymphadenopathy or thryomegaly appreciated. Cor: PMI normal. Regular rate & rhythm. No rubs, gallops or murmurs. Lungs: clear Abdomen: soft, nontender, nondistended. No hepatosplenomegaly. No bruits or masses. Extremities: no cyanosis, clubbing, rash. 1+ edema left lower leg;  trace pitting in right lower leg Neuro: alert & oriented x3, cranial nerves grossly intact. Moves all 4 extremities w/o difficulty. Affect pleasant.   ECG: not done   ASSESSMENT & PLAN:  NICM with now preserved ejection fraction- - likely due to to HTN/ COPD - NYHA class II - euvolemic - weighing most days; reviewed the importance of weighing daily and to call for an overnight weight gain of > 2 pounds or a weekly  weight gain of > 5 pounds - weight 189 pounds from last visit here 2 months ago - echo 01/28/22: EF 40-45% LV with mildly decreased function and global hypokinesis with mild LVH - echo 08/20/23: EF 60-65% with mild LVH, Grade I DD (reviewed this with patient today) - reviewed keeping daily fluid intake to 60-64 ounces - reviewed keeping daily sodium intake to 2000mg   - continue entresto  24/26mg  BID - continue farxiga  10mg  daily - continue metoprolol  succinate 25mg  daily - continue spironolactone  12.5mg  daily (titrate this at next visit if BP is still a little elevated) - not adding salt to her food but does like to eat out on occasion - elevate legs when sitting for long periods of time; she was unable to tolerate compression socks - saw cardiology Melva) 09/24 - encouraged as much activity as possible - BNP 01/27/22 721.9  Hypertension- - BP  - saw PCP Abe) 12/24 - BMP 10/14/23 reviewed and showed sodium 143, potassium 4.1, creatinine 1.38 and GFR 39  COPD- - saw pulmonologist Alica) 11/24 - smoking 1 pack every 2-3 days; does not smoke with oxygen  on - continued cessation discussed for 3 minutes - on O2 @ 2L at bedtime and PRN during the day; says that she doesn't wear it much during the day - pulmonary rehab referral placed as she is interested although is concerned about the time commitment but is willing to discuss this with them  DM- - A1c 04/24/23 was 7.9%  - ozempic  2mg  weekly (had been off for 1 month due to cost and resumed it 3 weeks ago) - saw  nephrology Geoffry) 09/24

## 2023-12-31 ENCOUNTER — Encounter: Payer: Medicare Other | Admitting: Family

## 2023-12-31 ENCOUNTER — Other Ambulatory Visit: Payer: Self-pay

## 2024-01-01 ENCOUNTER — Other Ambulatory Visit: Payer: Self-pay

## 2024-01-01 MED ORDER — SACUBITRIL-VALSARTAN 24-26 MG PO TABS: 1.0000 | ORAL_TABLET | Freq: Two times a day (BID) | ORAL | 3 refills | Status: DC

## 2024-01-01 NOTE — Progress Notes (Signed)
   01/01/2024  Patient ID: Kelsey Mcdowell, female   DOB: [birth year], 78 y.o.   MRN: [id number]  S/O Patient outreach to follow-up on management of diabetes and status of [place] Medicare Extra Help Application  Diabetes Management -Current medications:  Ozempic  1mg  weekly, Farxiga  10mg  daily, Lantus  16 units daily, Humulin R  up to 7 units BID with meals based on BG -Patient is requesting a refill on Humulin R  -Patient checks FBG and pre-prandial BG daily -A1c 3 weeks ago 7.%, down from 8.3% 4 months ago -Patient does not endorse any s/sx of hypoglycemia -Dr. Vicci holding Ozempic  at 1mg  weekly until patient is seen by GI in Feb (persistent diarrhea existing prior to initiation of Ozempic )  Medication Access/Adherence -Patient received communication from [facility] that she has been approved for [place] Medicare Extra Help.  This will make her ineligible for Entresto  PAP, but she can now fill this medication for $12 copay. -Stating she received another letter about [place] Medicaid needing additional information from patient, but we did not apply for this benefit  A/P  Diabetes Management -Continue current regimen at this time -Continue to monitor and record BG daily -Pending refill for Humulin R  for Dr. Vicci to sign if in agreement  Medication Access/Adherence -Pending prescription for Entresto  for Kelsey Mcdowell with Cardiology to send to patient's pharmacy now that copay will be affordable -Attempted to contact Sharene Finder regarding letter patient received about Medicaid but had to leave a voicemail.  I will try to call again next week if I do not hear back  Follow-up:  4 weeks  Channing DELENA Mealing, PharmD, DPLA

## 2024-01-02 MED ORDER — INSULIN REGULAR HUMAN 100 UNIT/ML IJ SOLN: 7.0000 [IU] | Freq: Two times a day (BID) | INTRAMUSCULAR | 0 refills | Status: DC

## 2024-01-08 NOTE — Progress Notes (Signed)
   01/08/2024  Patient ID: Kelsey Mcdowell, female   DOB: 1946-04-22, 78 y.o.   MRN: [id number]  Incoming call from Nigel Bridgeman with Medicaid regarding paperwork that [name] received about potential Medicaid benefits.  Patient was recently approved for Medicare extra help, which automatically looks into benefit eligibility for state programs including Medicaid.  Nigel Bridgeman with the Medicaid program states if patient does not wish to enroll or attempt to enroll in any state assistance benefits, she can just disregard letter she received in the mail.  Attempted to call patient to make her aware but had to leave a voicemail for her to return my call.  [name], PharmD, DPLA

## 2024-01-13 ENCOUNTER — Ambulatory Visit: Payer: Medicare Other | Attending: Family | Admitting: Family

## 2024-01-13 ENCOUNTER — Encounter: Payer: Self-pay | Admitting: Family

## 2024-01-13 VITALS — BP 154/71 | HR 74 | Resp 14 | Wt 191.0 lb

## 2024-01-13 DIAGNOSIS — F17210 Nicotine dependence, cigarettes, uncomplicated: Secondary | ICD-10-CM | POA: Insufficient documentation

## 2024-01-13 DIAGNOSIS — I10 Essential (primary) hypertension: Secondary | ICD-10-CM

## 2024-01-13 DIAGNOSIS — I428 Other cardiomyopathies: Secondary | ICD-10-CM | POA: Diagnosis not present

## 2024-01-13 DIAGNOSIS — E669 Obesity, unspecified: Secondary | ICD-10-CM | POA: Insufficient documentation

## 2024-01-13 DIAGNOSIS — Z7985 Long-term (current) use of injectable non-insulin antidiabetic drugs: Secondary | ICD-10-CM | POA: Diagnosis not present

## 2024-01-13 DIAGNOSIS — I5022 Chronic systolic (congestive) heart failure: Secondary | ICD-10-CM | POA: Diagnosis not present

## 2024-01-13 DIAGNOSIS — J449 Chronic obstructive pulmonary disease, unspecified: Secondary | ICD-10-CM | POA: Diagnosis not present

## 2024-01-13 DIAGNOSIS — Z720 Tobacco use: Secondary | ICD-10-CM | POA: Diagnosis not present

## 2024-01-13 DIAGNOSIS — E785 Hyperlipidemia, unspecified: Secondary | ICD-10-CM | POA: Diagnosis not present

## 2024-01-13 DIAGNOSIS — N1831 Chronic kidney disease, stage 3a: Secondary | ICD-10-CM

## 2024-01-13 DIAGNOSIS — Z794 Long term (current) use of insulin: Secondary | ICD-10-CM

## 2024-01-13 DIAGNOSIS — I5032 Chronic diastolic (congestive) heart failure: Secondary | ICD-10-CM

## 2024-01-13 DIAGNOSIS — Z79899 Other long term (current) drug therapy: Secondary | ICD-10-CM | POA: Insufficient documentation

## 2024-01-13 DIAGNOSIS — I110 Hypertensive heart disease with heart failure: Secondary | ICD-10-CM | POA: Diagnosis not present

## 2024-01-13 DIAGNOSIS — E1122 Type 2 diabetes mellitus with diabetic chronic kidney disease: Secondary | ICD-10-CM

## 2024-01-13 DIAGNOSIS — E1140 Type 2 diabetes mellitus with diabetic neuropathy, unspecified: Secondary | ICD-10-CM | POA: Diagnosis not present

## 2024-01-13 NOTE — Patient Instructions (Addendum)
We have placed referral to [facility]. Please call them to get your appointment scheduled. [phone] Their hours are 8:30AM - 2:00PM  It was good to see you today!!

## 2024-01-13 NOTE — Progress Notes (Signed)
PCP: Olevia Perches, DO (last seen 12/24) Primary Cardiologist: Leanora Ivanoff PA (last seen 01/25 earlier today)  Chief Complaint: shortness of breath  HPI:  Kelsey Mcdowell is a 78 yo with a PMHx of COPD, acute on chronic systolic CHF, DM II, tobacco abuse, obesity, and HTN.  Admitted 04/24/23 due to several days of diarrhea, decreased PO intake and worsening weakness. Found to have oliguric AKI. Held furosemide, aldactone, entresto, hydrochlorothiazide, dapagliflozin during hospital stay.   Echo 01/28/22: EF 40-45% LV with mildly decreased function and global hypokinesis with mild LVH.  Echo 08/20/23: EF 60-65% with mild LVH, Grade I DD  She presents today for a HF follow-up visit with a chief complaint of minimal shortness of breath with moderate exertion. Chronic in nature. Has associated pedal edema & neuropathy in her feet along with this. Says that occasionally her legs will also feel tight. Denies fatigue, chest pain, cough, palpitations, abdominal distention, pedal edema, dizziness or difficulty sleeping. Continues to sleep in her recliner for comfort.   No oxygen for ~ 3 weeks due to change in insurance company.  Continues to smoke 1 ppd of cigarettes  ROS: All systems negative except as listed in HPI, PMH and Problem List.  SH:  Social History   Socioeconomic History   Marital status: Widowed    Spouse name: Not on file   Number of children: Not on file   Years of education: Not on file   Highest education level: Not on file  Occupational History   Not on file  Tobacco Use   Smoking status: Every Day    Current packs/day: 0.00    Types: Cigarettes    Last attempt to quit: 12/31/2022    Years since quitting: 1.0   Smokeless tobacco: Never  Vaping Use   Vaping status: Never Used  Substance and Sexual Activity   Alcohol use: Yes    Comment: occasional   Drug use: No   Sexual activity: Not on file  Other Topics Concern   Not on file  Social History Narrative   Not on  file   Social Drivers of Health   Financial Resource Strain: Low Risk  (04/25/2023)   Received from [facility], [facility]   Overall Financial Resource Strain (CARDIA)    Difficulty of Paying Living Expenses: Not very hard  Food Insecurity: No Food Insecurity (04/25/2023)   Received from [facility], [facility]   Hunger Vital Sign    Worried About Running Out of Food in the Last Year: Never true    Ran Out of Food in the Last Year: Never true  Transportation Needs: No Transportation Needs (04/25/2023)   Received from [facility], [facility]   [facility] - Transportation    Lack of Transportation (Medical): No    Lack of Transportation (Non-Medical): No  Physical Activity: Not on file  Stress: Not on file  Social Connections: Not on file  Intimate Partner Violence: Not on file    FH:  Family History  Problem Relation Age of Onset   Cancer Mother    Hypertension Mother    Diabetes Mother    Heart disease Father    Diabetes Father    Cancer Sister    Breast cancer Sister 50   Heart disease Brother     Past Medical History:  Diagnosis Date   Arthritis    CHF (congestive heart failure) (HCC)    COPD (chronic obstructive pulmonary disease) (HCC)  Diabetes mellitus without complication (HCC)    GERD (gastroesophageal reflux disease)    Hyperlipemia    Hypertension    Myocardial infarction (HCC)    light heart attack ?when   Neuropathy    Sleep apnea     Current Outpatient Medications  Medication Sig Dispense Refill   acetaminophen (TYLENOL) 500 MG tablet Take 500 mg by mouth every 6 (six) hours as needed (back pain).     Blood Glucose Monitoring Suppl (ONE TOUCH ULTRA 2) w/Device KIT 1 each by Does not apply route daily at 2 PM. 1 kit 0   cholecalciferol (VITAMIN D3) 25 MCG (1000 UNIT) tablet Take 1,000 Units by mouth daily.     dapagliflozin propanediol (FARXIGA) 10 MG TABS tablet Take 1 tablet (10 mg total) by mouth daily before breakfast. 90  tablet 3   DULoxetine (CYMBALTA) 20 MG capsule Take 1 capsule (20 mg total) by mouth daily. 90 capsule 1   fluticasone (FLONASE) 50 MCG/ACT nasal spray Place 2 sprays into both nostrils daily. 16 g 12   gabapentin (NEURONTIN) 300 MG capsule Take 1 cap in the AM and 2 in the PM 270 capsule 1   glucose blood (ONETOUCH ULTRA) test strip 1 each by Other route daily at 2 PM. Use as instructed 100 each 3   insulin glargine (LANTUS) 100 UNIT/ML Solostar Pen Inject 20 Units into the skin daily. (Patient taking differently: Inject 16 Units into the skin daily.) 15 mL 0   Insulin Pen Needle 34G X 3.5 MM MISC 1 Dose by Does not apply route daily. 100 each 0   insulin regular (HUMULIN R) 100 units/mL injection Inject 0.07 mLs (7 Units total) into the skin 2 (two) times daily before a meal. 10 mL 0   Lancets (ONETOUCH ULTRASOFT) lancets 1 each by Other route daily at 2 PM. Use as instructed 100 each 3   metoprolol succinate (TOPROL-XL) 25 MG 24 hr tablet Take 1 tablet (25 mg total) by mouth daily. 180 tablet 1   Multiple Vitamin (MULTIVITAMIN WITH MINERALS) TABS tablet Take 1 tablet by mouth daily.     nortriptyline (PAMELOR) 25 MG capsule Take 50 mg by mouth daily.     omeprazole (PRILOSEC) 20 MG capsule Take 1 capsule (20 mg total) by mouth daily. 90 capsule 3   ONETOUCH VERIO test strip 1 each by Other route 3 (three) times daily. 100 each 12   rosuvastatin (CRESTOR) 10 MG tablet Take 1 tablet (10 mg total) by mouth at bedtime. 90 tablet 1   sacubitril-valsartan (ENTRESTO) 24-26 MG Take 1 tablet by mouth 2 (two) times daily. 180 tablet 3   Secukinumab (COSENTYX [place]) Inject into the skin every 30 (thirty) days. Last date give was the week of 11/25/22. Pt unsure of exact date.     Semaglutide, 1 MG/DOSE, (OZEMPIC, 1 MG/DOSE,) 4 MG/3ML SOPN Inject 1 mg into the skin once a week.     spironolactone (ALDACTONE) 25 MG tablet Take 1/2 (one-half) tablet by mouth once daily 15 tablet 5   traZODone (DESYREL) 50 MG  tablet Take 0.5-1 tablets (25-50 mg total) by mouth at bedtime as needed for sleep. 30 tablet 3   triamcinolone ointment (KENALOG) 0.5 % Apply 1 Application topically daily as needed.     vitamin B-12 (CYANOCOBALAMIN) 1000 MCG tablet Take 1,000 mcg by mouth daily.     No current facility-administered medications for this visit.   Vitals:   01/13/24 1310  BP: (!) 154/71  Pulse: 74  Resp: 14  SpO2: 99%  Weight: 191 lb (86.6 kg)   Wt Readings from Last 3 Encounters:  01/13/24 191 lb (86.6 kg)  12/05/23 188 lb 3.2 oz (85.4 kg)  10/29/23 189 lb (85.7 kg)   Lab Results  Component Value Date   CREATININE 1.38 (H) 10/14/2023   CREATININE 1.54 (H) 08/05/2023   CREATININE 1.74 (H) 05/08/2023   PHYSICAL EXAM:  General:  Well appearing. No resp difficulty HEENT: normal Neck: supple. JVP flat. No lymphadenopathy or thryomegaly appreciated. Cor: PMI normal. Regular rate & rhythm. No rubs, gallops or murmurs. Lungs: clear Abdomen: soft, nontender, nondistended. No hepatosplenomegaly. No bruits or masses. Extremities: no cyanosis, clubbing, rash. 1+ edema left lower leg; trace pitting in right lower leg Neuro: alert & oriented x3, cranial nerves grossly intact. Moves all 4 extremities w/o difficulty. Affect pleasant.   ECG: not done   ASSESSMENT & PLAN:  NICM with preserved ejection fraction- - likely due to to HTN/ COPD - NYHA class II - euvolemic - weighing most days; reviewed the importance of weighing daily and to call for an overnight weight gain of > 2 pounds or a weekly weight gain of > 5 pounds - weight up 2 pounds from last visit here 2 months ago - echo 01/28/22: EF 40-45% LV with mildly decreased function and global hypokinesis with mild LVH - echo 08/20/23: EF 60-65% with mild LVH, Grade I DD (reviewed this with patient today) - reviewed keeping daily fluid intake to 60-64 ounces - reviewed keeping daily sodium intake to 2000mg   - continue entresto 24/26mg  BID -  continue farxiga 10mg  daily - continue metoprolol succinate 25mg  daily - continue spironolactone 12.5mg  daily  - not adding salt to her food but does like to eat out on occasion - elevate legs when sitting for long periods of time; she was unable to tolerate compression socks - saw cardiology Mellissa Kohut) 01/25 (earlier today) - encouraged as much activity as possible - BNP 01/27/22 721.9  Hypertension- - BP 154/71 - saw PCP [name]) 12/24 - BMP 10/14/23 reviewed and showed sodium 143, potassium 4.1, creatinine 1.38 and GFR 39  COPD- - saw pulmonologist Meredeth Ide) 11/24 - currently doesn't have oxygen as her insurance company changed oxygen providers and she hasn't had oxygen for ~ 3 weeks; emphasized calling Dr. Reita Cliche office for assistance - she was unable to commit to pulmonary rehab because of her living 1/2 hour away from [facility] and gas is too expensive - is agreeable to try physical therapy if one is close enough for her; referral placed to [facility] for strengthening  DM- - A1c 04/24/23 was 7.9%  - ozempic 1mg  weekly; managed by PCP - saw nephrology Cherylann Ratel) 09/24  5: Tobacco use- - smoking 1 pack cigarettes daily which is more than she was smoking - discussed health coach and she is agreeable so the referral was placed today  Return in 3 months, sooner if needed.

## 2024-01-14 ENCOUNTER — Telehealth (HOSPITAL_BASED_OUTPATIENT_CLINIC_OR_DEPARTMENT_OTHER): Payer: Self-pay

## 2024-01-14 DIAGNOSIS — Z0000 Encounter for general adult medical examination without abnormal findings: Secondary | ICD-10-CM

## 2024-01-14 NOTE — Telephone Encounter (Signed)
Called patient per health coaching referral for smoking cessation and physical activity. Patient expressed interest in health coaching and have been scheduled for her initial health coaching appointment over the phone on 1/27 at 10:00am. Patient will be called at that time.   [name], MS, ERHD, [facility]  Care Guide, Health & Wellness Coach [address] [place] [address] Telephone: [phone] Email: [email or web address]

## 2024-01-19 ENCOUNTER — Ambulatory Visit: Payer: Medicare Other | Attending: Cardiology

## 2024-01-19 ENCOUNTER — Other Ambulatory Visit: Payer: Self-pay | Admitting: Family Medicine

## 2024-01-19 DIAGNOSIS — Z0000 Encounter for general adult medical examination without abnormal findings: Secondary | ICD-10-CM

## 2024-01-19 NOTE — Telephone Encounter (Signed)
Medication Refill -  Most Recent Primary Care Visit:  Provider: Dorcas Carrow  Department: [facility]  Visit Type: OFFICE VISIT  Date: 12/05/2023  Medication: insulin glargine (LANTUS) 100 UNIT/ML Solostar Pen   Has the patient contacted their pharmacy? yes Agent: If yes, when and what did the pharmacy advise?)contact pcp  pt says [facility] sent the wrong one, now she wants to use [facility]  Is this the correct pharmacy for this prescription? no  This is the patient's preferred pharmacy:  [facility] [address] [place] - [address] [place] [address] Phone: [phone] Fax: [phone]    Has the prescription been filled recently? no  Is the patient out of the medication? yes  Has the patient been seen for an appointment in the last year OR does the patient have an upcoming appointment? yes  Can we respond through MyChart? yes  Agent: Please be advised that Rx refills may take up to 3 business days. We ask that you follow-up with your pharmacy.

## 2024-01-19 NOTE — Progress Notes (Signed)
HEALTH & WELLNESS COACHING INITIAL INTAKE   Appointment Outcome: Completed, Session #: Initial                         Start time: 10:08am  End time: 11:00am  Total Mins: 52 minutes     What are the Patient's goals from Coaching? Patient desires to quit smoking and managing stress to remain smoke free.   Why did they seek coaching now? Patient has attempted to quit on her own but resumed smoking within one month. Patient identified stress as a main contributor to smoking and would like to identify strategies that will help her cope with stress and remain smoke free.   Readiness - What stage is the patient in regarding their goal(s)? Patient is in the preparation stage of smoking cessation and stress management.      Coaching Progress Notes: Patient stated that she has quit smoking previously for 20 years. Patient begun smoking again to cope with the stress when she cared for her husband who fell ill and passed after some time. Patient stated that her husband supported her during the time that she worked towards becoming smoke free. Patient mentioned that they agreed to not smoke in the home after remodeling. Patient stated that her husband quit and encouraged her to do the same. Patient shared that she used nicotine patches at that time and stayed busy to help refrain from smoking.  Patient expressed that she doesn't want to smoke anymore, that she hates the smell, and has tried to quit on her own recently. Patient reported that she quit for one month but was triggered back into smoking to cope with stress. Patient stated another time she used nicotine patches but wasn't able to quit, but cut back. Patient stated that she has to quit smoking now.   Patient stated that she tries to stay busy by cleaning her home, baking pies and making candy. Patient stated that during this time she is able to refrain from smoking. Patient reported that the longest she can go without smoking is 2 hours.  Patient shared that she smokes after meals, and a lot at night time because she thinks about her husband. Patient expressed that she is having a hard time coping with death after the loss of her husband, and recently her sister within the past year.  Patient reported that one pack of cigarettes last her one day, but sometimes longer depending on her level of stress. Patient mentioned that she also smokes when she is not busy. Patient stated that she does not smoke when she is in her car or out around other. Patient expressed that she wants to try using nicotine patches again to aid in smoking cessation.   Patient stated that she loves outdoors working in her yard and flowers, but is not able to bend due to pain/back problems. Patient shared that she wants to be physically active and believes that it would be helpful in her being successful with quitting.     Coaching Outcomes Sent message to [facility], FNP, about the patient's interest in starting nicotine patches again for smoking cessation.   Discussed with patient additional strategies that she would like to implement to help her reduce smoking while waiting for her prescription to be filled for the nicotine patches.  Discussed with patient additional times that she has found herself smoking less. Patient stated that she doesn't smoke after drinking water. Discussed how she could use this  strategy to reduce smoking.  Patient loves baking and making candy and discussed how she can find more recipes and put them in a book.  Discussed with patient what physical activity that she could implement that would be encourage movement and deter her from smoking.   AGREEMENTS SECTION   Overall Goal(s): Quit smoking with the aid of nicotine replacement therapy and managing stress to refrain from relapsing.                                      Agreement/Action Steps:  Smoking cessation (Quit date - TBD) Wear nicotine patches as  prescribed Refrain from smoking after beginning use of nicotine patches Drink water when the nicotine cravings start Engage in chair exercise for 5 minutes when nicotine cravings start   Stress management Identify new baking/candy recipes for book Continue to bake and make candy as a hobby     Patient review of Health Coaching Agreement Reviewed Coaching Agreement and Code of Ethics with Patient during initial session. Answered any questions the patient had if any regarding the Coaching Agreement and Code of Ethics. Patient verbally agreed to adhere to the Coaching Agreement and to abide by the Code of Ethics.  Mailed patient with a hard/electronic copy of the Coaching Agreement and Code of Ethics.     Referrals: N/A  Resources: Mailed patient a copy of her action steps as an [employer].

## 2024-01-20 DIAGNOSIS — E113313 Type 2 diabetes mellitus with moderate nonproliferative diabetic retinopathy with macular edema, bilateral: Secondary | ICD-10-CM | POA: Diagnosis not present

## 2024-01-21 NOTE — Telephone Encounter (Signed)
Requested medication (s) are due for refill today: Yes  Requested medication (s) are on the active medication list: Yes  Last refill:  05/08/23  Future visit scheduled: Yes  Notes to clinic:  Patient reports taking lower dose than is prescribed, will need a new Rx reflecting this change.     Requested Prescriptions  Pending Prescriptions Disp Refills   insulin glargine (LANTUS) 100 UNIT/ML Solostar Pen      Sig: Inject 20 Units into the skin daily.     Endocrinology:  Diabetes - Insulins Passed - 01/21/2024  5:38 PM      Passed - HBA1C is between 0 and 7.9 and within 180 days    Hemoglobin A1C  Date Value Ref Range Status  04/24/2023 7.9  Final   HB A1C (BAYER DCA - WAIVED)  Date Value Ref Range Status  12/05/2023 7.0 (H) 4.8 - 5.6 % Final    Comment:             Prediabetes: 5.7 - 6.4          Diabetes: >6.4          Glycemic control for adults with diabetes: <7.0          Passed - Valid encounter within last 6 months    Recent Outpatient Visits           1 month ago Type 2 diabetes mellitus with cardiac complication [facility])   [place] [facility] [place], Megan P, DO   4 months ago Chronic diarrhea   [place] [facility] [place], Megan P, DO   5 months ago Type 2 diabetes mellitus with cardiac complication [facility])   [place] [facility] [place], Megan P, DO   7 months ago Encounter for annual wellness exam in Medicare patient   [employer] [facility] [place], Megan P, DO   8 months ago Controlled type 2 diabetes mellitus without complication, with long-term current use of insulin [facility])   [place] [facility] Bowman, Oralia Rud, DO       Future Appointments             In 1 month Johnson, Oralia Rud, DO [place] [facility]

## 2024-01-22 ENCOUNTER — Other Ambulatory Visit: Payer: Self-pay

## 2024-01-22 MED ORDER — INSULIN GLARGINE 100 UNIT/ML SOLOSTAR PEN: 16.0000 [IU] | PEN_INJECTOR | Freq: Every day | SUBCUTANEOUS | 0 refills | Status: DC

## 2024-01-22 MED ORDER — NICOTINE 21 MG/24HR TD PT24: 21.0000 mg | MEDICATED_PATCH | Freq: Every day | TRANSDERMAL | 3 refills | Status: DC

## 2024-01-24 DIAGNOSIS — J449 Chronic obstructive pulmonary disease, unspecified: Secondary | ICD-10-CM | POA: Diagnosis not present

## 2024-01-26 ENCOUNTER — Telehealth: Payer: Self-pay

## 2024-01-26 ENCOUNTER — Ambulatory Visit: Payer: Medicare Other

## 2024-01-26 DIAGNOSIS — Z0000 Encounter for general adult medical examination without abnormal findings: Secondary | ICD-10-CM

## 2024-01-26 NOTE — Telephone Encounter (Signed)
Returned patient's call to reschedule health coaching due to death in the family. Patient has been rescheduled for 2-4 at 9:00am. Patient will be called at that time. Patient mentioned that she was in the process of picking up her nicotine patches when she learned that her insurance was not going to cover the cost of the patches. Patient was advised by pharmacist that she can purchase them from [facility]. Patient stated that she will purchase them from [facility]. Informed patient if she has trouble with finding the patches that she can reach out to [employer]. Patient verbalized understanding. Patient is aware that she was prescribed the 21mg  patches to start with.   [name], MS, ERHD, [facility]  Care Guide, Health & Wellness Coach [address], [address] [place] [address] Telephone: [phone] Email: [email or web address]

## 2024-01-27 ENCOUNTER — Telehealth: Payer: Self-pay | Admitting: Licensed Clinical Social Worker

## 2024-01-27 ENCOUNTER — Telehealth: Payer: Self-pay

## 2024-01-27 ENCOUNTER — Ambulatory Visit: Payer: Medicare Other

## 2024-01-27 DIAGNOSIS — Z0000 Encounter for general adult medical examination without abnormal findings: Secondary | ICD-10-CM

## 2024-01-27 NOTE — Telephone Encounter (Signed)
 Called patient to inform that their shipment through PAP (Patient Assistance Program) has been received in office and is available for pickup Monday-Friday 8am - 5pm. Patient aware to receive they will need to sign and date the pick up paperwork at the front desk folder.   Company: [employer] Medication received: Ozempic  1 mg How many units: 4 boxes

## 2024-01-27 NOTE — Telephone Encounter (Signed)
 H&V Care Navigation CSW Progress Note  Clinical Social Worker contacted patient by phone to f/u on referral for grief and mental health supportive resources. Was able to reach pt today at [phone]. Introduced self, role, reason for call. Pt appreciative of f/u but shares she is currently being picked up by her brother to attend funeral. She shared a bit about her loss at this time- she comes from a large family, she has lost her husband, sister and sister in law in the past year; feels overwhelmed when she thinks about her loss. She feels support from her two brothers. Is amenable to me calling her again tomorrow to provide additional supportive check in and additional resources for her to engage in formal grief support. Encouraged her to call before I reach out as needed if any additional questions.   Patient is participating in a Managed Medicaid Plan:  no, [employer]  SDOH Screenings   Food Insecurity: No Food Insecurity (04/25/2023)   Received from [facility], [facility]  Transportation Needs: No Transportation Needs (04/25/2023)   Received from [facility], [facility]  Depression [phone]): Low Risk  (12/05/2023)  Financial Resource Strain: Low Risk  (04/25/2023)   Received from [facility], [facility]  Tobacco Use: High Risk (01/13/2024)    Marit Lark, MSW, LCSW Clinical Social Worker II [facility] Heart/Vascular Care Navigation  [phone]- work cell phone (preferred) [phone]- desk phone

## 2024-01-27 NOTE — Progress Notes (Signed)
 Appointment Outcome: Completed, Session #: 1 wk f/u                         Start time: 9:00am   End time: 9:23am   Total Mins: 23 minutes  AGREEMENTS SECTION   Overall Goal(s): Quit smoking with the aid of nicotine  replacement therapy and managing stress to refrain from relapsing.                                         Agreement/Action Steps:  Smoking cessation (Quit date - TBD) Wear nicotine  patches as prescribed Refrain from smoking after beginning use of nicotine  patches Drink water when the nicotine  cravings start Engage in chair exercise for 5 minutes when nicotine  cravings start     Stress management Identify new baking/candy recipes for book Continue to bake and make candy as a hobby    Progress Notes:  Patient shared that she attempted to pick up her prescription for the nicotine  patches but was told her insurance did not cover the cost. Patient stated that $77 was high and was told by pharmacist that she could purchase them OTC at [facility] at a cheaper price. Patient stated that she visited a [facility] to buy the nicotine  patches, but they were sold out of the 21mg  patches.   Patient reported that her smoking remains about the same, but smokes more at times than others. Patient mentioned that she believes that once she can get the patches that she will be successful in quitting smoking.   Patient expressed that her smoking is contributed to grieving the loss of her husband and sister recently, along with feeling depressed. Patient shared that for a month it has been hard to do the things that she needs to do. Patient expressed that trying to deal with loss causes her to sit and cry.  Discussed with patient if she has had thoughts of harming herself or another person, she stated that she had the thoughts, but no plan. Patient stated that she has a support system but don't want to be a burden.   Indicators of Success and Accountability: Patient attempted to pick up the  nicotine  patches per prescription but was told her insurance did not cover them.  Readiness: Patient is in the preparation stage of smoking cessation and stress management.  Strengths and Supports: Patient has a friend that supports her and two brothers. Patient is relying on being an advocate for herself.  Challenges and Barriers: Patient stated that she has been grieving and feels depressed, which contributes to her smoking. Patient stated that her insurance did not cover the cost of the nicotine  patch prescription.      Coaching Outcomes: Patient was asked if she had considered grief counseling. Patient stated that she has thought about reaching out but wasn't sure if she wanted to participate in counseling. Patient was provided with the number to the National Suicide Prevention Lifeline 1-800-273-TALK, the National Suicide and Crisis [address], [place], and was informed that she can walk into the ED at the [facility] since she lives in [place].   Patient recalled the information that she wrote down for confirmation that she had the contacts for these resources. Patient was asked if it was okay to have someone else touch base with her to connect her to any additional resources that may be available.  Patient verbally agreed to being called.   Patient will purchase the 21mg  nicotine  patches from [facility] within the next two weeks. Patient was provided the number to [employer] as an additional resource to acquire nicotine  patches.     Referrals: Reached out to Marit BROCKS., LCSW for grief and mental health resources.

## 2024-01-28 ENCOUNTER — Other Ambulatory Visit: Payer: Self-pay | Admitting: Family Medicine

## 2024-01-28 ENCOUNTER — Telehealth: Payer: Self-pay | Admitting: Licensed Clinical Social Worker

## 2024-01-28 ENCOUNTER — Other Ambulatory Visit: Payer: Self-pay

## 2024-01-28 NOTE — Progress Notes (Signed)
 Heart and Vascular Care Navigation  01/28/2024  Kelsey Mcdowell 1946-11-21 [id number]  Reason for Referral: grief support in community/check in from SW team Patient is participating in a Managed Medicaid Plan: No, [employer]  Engaged with patient by telephone for initial visit for Heart and Vascular Care Coordination.                                                                                                   Assessment:                                     LCSW was able to reach pt today again by phone at [phone]. Confirmed this is her home phone (has a cell phone she doesn't use regularly), confirmed home address, PCP and enrolled in [facility] Medicare. She resides alone, has family very close nearby and confirms her nieces Kelsey Mcdowell and Kelsey Mcdowell are her emergency contacts and I will add Kelsey to list. Pt is widowed, the home she lives in was her childhood home that she and her husband lived in. Her niece actually bought the house from her and pt is residing there without cost. She denies any issues with obtaining or affording food or medications and has no issues getting to and from appointments and around for daily needs.  She is very grateful to have such a large and supportive family (shares she has roughly 19 nieces and 9 nephews and 2 brothers still living) . She does not have children of her own. Yesterday she attended a funeral for her sister in law whom she felt was a close as a sister, her grief at this loss is increased by feelings of loss from other sibling deaths (she has had all of her sisters and some brothers pass over the past few years) and the loss of her husband (last year). She also learned that her brothers are managing health challenges of their own including cancer. She shares that sometimes she sits and thinks about all of it and cries. Safety noted and pt not currently experiencing SI/HI ideations. She expresses feeling of  overwhelm with managing these things. Provided active listening and discussed with pt that the feelings she is experiencing are normal and that crying is an appropriate response when feeling loss. She shares that when she first lost her husband she felt sad but not the extent that she now is experiencing as it compounds with additional losses. She thinks likely this was because he had dementia and often she had a harder role as a caregiver.   We discussed that although these feelings and reactions to loss are normal that we want her to have a safe space to process everything she has going on. When her husband passed she did speak one on one with a counselor from [facility] and actually still has their magnet on the fridge. I provided her the number for their grief counseling services as well as sharing that they have several upcoming work shops. I encouraged  her to consider one on one counseling again and if comfortable perhaps later participating in their groups or grief share groups which are held at many local faith communities. Pt is agreeable to reaching out about this. I encouraged her to consider all that she has on her plate now and how we can best support her. If this is not a time that she is going to be able to actively engage in smoking cessation due to her stress levels that our care navigation team is able to delay and then re-engage with her at another time, provided additional clarification that her current sessions are not counseling. Provided verbal encouragement for the courage it takes to engage with bettering her physical and mental health and to let us  know if there are other ways we can engage with her to provide other mental health resources or support.   No additional questions/concerns from pt at this time. Remain available and able to provide additional supportive resources as needed.   HRT/VAS Care Coordination     Patients Home Cardiology Office [place]   Outpatient Care  Team Social Worker   Social Worker Name: Marit Lark, [place], [phone]   Living arrangements for the past 2 months Single Family Home   Lives with: Self   Patient Current Insurance Coverage Managed Medicare   Patient Has Concern With Paying Medical Bills No   Does Patient Have Prescription Coverage? Yes   Home Assistive Devices/Equipment None       Social History:                                                                             SDOH Screenings   Food Insecurity: No Food Insecurity (01/28/2024)  Housing: Low Risk  (01/28/2024)  Transportation Needs: No Transportation Needs (01/28/2024)  Utilities: Not At Risk (01/28/2024)  Depression (PHQ2-9): Low Risk  (12/05/2023)  Financial Resource Strain: Low Risk  (01/28/2024)  Stress: Stress Concern Present (01/28/2024)  Tobacco Use: High Risk (01/13/2024)  Health Literacy: Adequate Health Literacy (01/28/2024)    SDOH Interventions: Financial Resources:  Financial Strain Interventions: Intervention Not Indicated  Food Insecurity:  Food Insecurity Interventions: Intervention Not Indicated  Housing Insecurity:  Housing Interventions: Intervention Not Indicated  Transportation:   Transportation Interventions: Intervention Not Indicated     Other Care Navigation Interventions:     Provided Pharmacy assistance resources  Pt denied any issues obtaining or affording medications at this time  Patient expressed Mental Health concerns Yes, Referred to:  [facility] grief counseling   Follow-up plan:   I have mailed pt the following: my card, [facility] grief counseling resources, information about additional [employer] from [employer].

## 2024-01-29 ENCOUNTER — Other Ambulatory Visit: Payer: Self-pay

## 2024-01-29 NOTE — Progress Notes (Unsigned)
   01/29/2024  Patient ID: Kelsey Mcdowell, female   DOB: 30-Dec-1945, 78 y.o.   MRN: [id number]  Patient out reach to follow-up regarding diabetes management plan, but I did not get my message over to Dr. Vicci until later than anticipated; so I have not yet heard back from her around my recommendations.  Informed patient I will give her a call as soon as I do hear from Dr. Vicci.  Kelsey Mcdowell, PharmD, DPLA

## 2024-01-29 NOTE — Progress Notes (Signed)
   01/29/2024  Patient ID: Kelsey Mcdowell, female   DOB: [birth year], 78 y.o.   MRN: [id number]  Subjective/objective Telephone visit to follow-up on control of type 2 diabetes  Diabetes management plan -Current medications: Farxiga  10 mg daily, Semglee  16 units daily, Humulin R  25 to 30 units 1-2 times daily based on blood glucose readings, Ozempic  1 mg weekly -Patient receives Ozempic  1 mg through [employer] PAP, and delivery arrived at Dr. Ferdie office 2/4 -Patient receives Farxiga  10 mg through [facility] P[facility], and medication delivery has arrived at patient's home -Patient states that approximately 2 weeks ago she was without her Semglee  for a week -She also endorses not taking her Ozempic  1 mg this week, because the dose increase seems to be causing nausea, stomach cramps, and gas.  Nausea and stomach cramps are usually just the day of the injection, but gas occurs most days. -Patient endorses fasting blood glucose averaging 70-80, and 2-hour postprandial blood glucose averaging 200-300 -Last A1c 7.0% -Patient takes Semglee   approximately 1 hour before bedtime and Humulin R  at breakfast and around 9 PM  Assessment/plan  Diabetes management plan -Controlled based on last A1c of 7.0% -Based on home FBG and 2 hour post-prandial readings, I do recommend adjusting insulin  regimen to increase FBG and decrease post prandial.  I recommend increasing Semglee  to 20 units at bedtime and decreasing Humulin R  to 10 units BID but taking right before breakfast and supper (approximately 9am and 5pm).   -I recommend resuming Ozempic  1mg  weekly- patient could see if injecting in the thigh alleviates GI upset, and we could consider ondansetron  4mg  q8h prn n/v -Consulting Dr. Ferdie to see if she agrees with these recommendations.  Patient has a follow-up with her 3/13, so she may prefer to wait until she sees the patient to make any changes.  I will inform patient of plan and schedule follow-up with me  accordingly.  Follow-up:  2/6 3pm  Channing DELENA Mealing, PharmD, DPLA

## 2024-01-29 NOTE — Telephone Encounter (Signed)
 Requested Prescriptions  Refused Prescriptions Disp Refills   HUMULIN R  100 UNIT/ML injection [Pharmacy Med Name: HumuLIN R  100 UNIT/ML Injection Solution] 10 mL 0    Sig: INJECT 7 UNITS SUBCUTANEOUSLY TWICE DAILY BEFORE A MEAL     Endocrinology:  Diabetes - Insulins Passed - 01/29/2024  2:26 PM      Passed - HBA1C is between 0 and 7.9 and within 180 days    Hemoglobin A1C  Date Value Ref Range Status  04/24/2023 7.9  Final   HB A1C (BAYER DCA - WAIVED)  Date Value Ref Range Status  12/05/2023 7.0 (H) 4.8 - 5.6 % Final    Comment:             Prediabetes: 5.7 - 6.4          Diabetes: >6.4          Glycemic control for adults with diabetes: <7.0          Passed - Valid encounter within last 6 months    Recent Outpatient Visits           1 month ago Type 2 diabetes mellitus with cardiac complication [facility])   [place] [place], Megan P, DO   4 months ago Chronic diarrhea   [place] [facility] [place], Megan P, DO   5 months ago Type 2 diabetes mellitus with cardiac complication [facility])   [place] [facility] [place], Megan P, DO   7 months ago Encounter for annual wellness exam in Medicare patient   [place] [facility] [place], Megan P, DO   8 months ago Controlled type 2 diabetes mellitus without complication, with long-term current use of insulin  [facility])   [place] [facility] [place], Duwaine SQUIBB, DO       Future Appointments             In 1 month Johnson, Duwaine SQUIBB, DO  [facility], [facility]

## 2024-02-01 MED ORDER — ONDANSETRON 4 MG PO TBDP: 4.0000 mg | ORAL_TABLET | Freq: Three times a day (TID) | ORAL | 2 refills | Status: DC | PRN

## 2024-02-01 NOTE — Addendum Note (Signed)
 Addended by: Solomon Dupre on: 02/01/2024 02:24 PM   Modules accepted: Orders

## 2024-02-02 ENCOUNTER — Telehealth: Payer: Self-pay

## 2024-02-02 ENCOUNTER — Other Ambulatory Visit: Payer: Self-pay | Admitting: Family Medicine

## 2024-02-02 ENCOUNTER — Other Ambulatory Visit: Payer: Self-pay | Admitting: Pharmacist

## 2024-02-02 MED ORDER — DAPAGLIFLOZIN PROPANEDIOL 10 MG PO TABS: 10.0000 mg | ORAL_TABLET | Freq: Every day | ORAL | 3 refills | Status: DC

## 2024-02-02 NOTE — Telephone Encounter (Signed)
 Medication Refill -  Most Recent Primary Care Visit:  Provider: Solomon Dupre  Department: [id number]  Visit Type: OFFICE VISIT  Date: 12/05/2023  Medication: nortriptyline  (PAMELOR ) 25 MG capsule [[id number]] Historical Provider insulin  regular (HUMULIN R ) 100 units/mL injection [[id number]]    Has the patient contacted their pharmacy? Yes (Agent: If no, request that the patient contact the pharmacy for the refill. If patient does not wish to contact the pharmacy document the reason why and proceed with request.) (Agent: If yes, when and what did the pharmacy advise?)  Is this the correct pharmacy for this prescription? Yes If no, delete pharmacy and type the correct one.  This is the patient's preferred pharmacy:  [facility] [address], [place] - [address] [place] [address] Phone: [phone] Fax: [phone]     Has the prescription been filled recently? Yes  Is the patient out of the medication? Yes  Has the patient been seen for an appointment in the last year OR does the patient have an upcoming appointment? Yes  Can we respond through MyChart? Yes  Agent: Please be advised that Rx refills may take up to 3 business days. We ask that you follow-up with your pharmacy.

## 2024-02-02 NOTE — Progress Notes (Signed)
   02/02/2024  Patient ID: Kelsey Mcdowell, female   DOB: [birth year], 78 y.o.   MRN: [id number]  Patient out reach to follow-up with [name] regarding diabetes management plan.  I was able to inform patient that ondansetron  has been sent into her pharmacy and counseled on use.  I recommend that she resume Ozempic  1 mg weekly and use the ondansetron  if needed for nausea-counseled her that this should resolve over time.  I was not able to address insulin  dosing recommendations, because she had company arrive during our phone call.  She endorses availability tomorrow afternoon to further discuss; so I have put her down for a 330 telephone follow-up visit tomorrow at that time.   Linn Rich, PharmD, DPLA

## 2024-02-03 ENCOUNTER — Telehealth: Payer: Self-pay

## 2024-02-03 ENCOUNTER — Other Ambulatory Visit: Payer: Self-pay

## 2024-02-03 MED ORDER — INSULIN REGULAR HUMAN 100 UNIT/ML IJ SOLN: 7.0000 [IU] | Freq: Two times a day (BID) | INTRAMUSCULAR | 0 refills | Status: DC

## 2024-02-03 NOTE — Telephone Encounter (Signed)
Called and spoke to patient. She states she is still taking this medication. She states that Dr. Quillian Quince was the previous prescribing provider.

## 2024-02-03 NOTE — Telephone Encounter (Signed)
Please confirm she has still been taking this. I don't have that she has had a refill since 2023

## 2024-02-03 NOTE — Telephone Encounter (Signed)
Requested medication (s) are due for refill today - unsure  Requested medication (s) are on the active medication list -yes  Future visit scheduled -yes  Last refill: unsure  Notes to clinic: listed as historical medication- patient states this was mail order Rx- but they have messed her Rx up so bad- she is requesting it at [facility]  Requested Prescriptions  Pending Prescriptions Disp Refills   nortriptyline (PAMELOR) 25 MG capsule 60 capsule     Sig: Take 2 capsules (50 mg total) by mouth daily.     Psychiatry:  Antidepressants - Heterocyclics (TCAs) Passed - 02/03/2024  3:13 PM      Passed - Completed PHQ-2 or PHQ-9 in the last 360 days      Passed - Valid encounter within last 6 months    Recent Outpatient Visits           2 months ago Type 2 diabetes mellitus with cardiac complication (HCC)   [place] [facility] [place], Megan P, DO   4 months ago Chronic diarrhea   [place] [facility] [place], Megan P, DO   6 months ago Type 2 diabetes mellitus with cardiac complication (HCC)   [place] [facility] [place], Megan P, DO   7 months ago Encounter for annual wellness exam in Medicare patient   [place] [facility] [place], Megan P, DO   9 months ago Controlled type 2 diabetes mellitus without complication, with long-term current use of insulin (HCC)   [place] [facility] [place], [place], DO       Future Appointments             In 1 month Johnson, Megan P, DO [place] [facility], [facility]            Signed Prescriptions Disp Refills   insulin regular (HUMULIN R) 100 units/mL injection 10 mL 0    Sig: Inject 0.07 mLs (7 Units total) into the skin 2 (two) times daily before a meal.     Endocrinology:  Diabetes - Insulins Passed - 02/03/2024  3:13 PM      Passed - HBA1C is between 0 and 7.9 and within 180 days    Hemoglobin A1C  Date Value Ref Range Status  04/24/2023 7.9   Final   HB A1C (BAYER DCA - WAIVED)  Date Value Ref Range Status  12/05/2023 7.0 (H) 4.8 - 5.6 % Final    Comment:             Prediabetes: 5.7 - 6.4          Diabetes: >6.4          Glycemic control for adults with diabetes: <7.0          Passed - Valid encounter within last 6 months    Recent Outpatient Visits           2 months ago Type 2 diabetes mellitus with cardiac complication [facility])   [place] [facility] [place], Megan P, DO   4 months ago Chronic diarrhea   [place] [facility] [place], Megan P, DO   6 months ago Type 2 diabetes mellitus with cardiac complication [facility])   [facility] [place], Megan P, DO   7 months ago Encounter for annual wellness exam in Medicare patient    [facility] [place], [place] P, DO   9 months ago Controlled type 2 diabetes mellitus  without complication, with long-term current use of insulin (HCC)   [place] [facility] [place], [place], DO       Future Appointments             In 1 month Johnson, Megan P, DO [place] [facility], [facility]               Requested Prescriptions  Pending Prescriptions Disp Refills   nortriptyline (PAMELOR) 25 MG capsule 60 capsule     Sig: Take 2 capsules (50 mg total) by mouth daily.     Psychiatry:  Antidepressants - Heterocyclics (TCAs) Passed - 02/03/2024  3:13 PM      Passed - Completed PHQ-2 or PHQ-9 in the last 360 days      Passed - Valid encounter within last 6 months    Recent Outpatient Visits           2 months ago Type 2 diabetes mellitus with cardiac complication (HCC)   [place] [facility] [place], Megan P, DO   4 months ago Chronic diarrhea   [place] [facility] [place], Megan P, DO   6 months ago Type 2 diabetes mellitus with cardiac complication [facility])   [place] [facility] [place], Megan P, DO   7 months ago  Encounter for annual wellness exam in Medicare patient   [place] [facility] [place], Megan P, DO   9 months ago Controlled type 2 diabetes mellitus without complication, with long-term current use of insulin (HCC)   [place] [facility] [place], [place], DO       Future Appointments             In 1 month Johnson, Megan P, DO [place] [facility], [facility]            Signed Prescriptions Disp Refills   insulin regular (HUMULIN R) 100 units/mL injection 10 mL 0    Sig: Inject 0.07 mLs (7 Units total) into the skin 2 (two) times daily before a meal.     Endocrinology:  Diabetes - Insulins Passed - 02/03/2024  3:13 PM      Passed - HBA1C is between 0 and 7.9 and within 180 days    Hemoglobin A1C  Date Value Ref Range Status  04/24/2023 7.9  Final   HB A1C (BAYER DCA - WAIVED)  Date Value Ref Range Status  12/05/2023 7.0 (H) 4.8 - 5.6 % Final    Comment:             Prediabetes: 5.7 - 6.4          Diabetes: >6.4          Glycemic control for adults with diabetes: <7.0          Passed - Valid encounter within last 6 months    Recent Outpatient Visits           2 months ago Type 2 diabetes mellitus with cardiac complication [facility])   [place] [facility] [place], Megan P, DO   4 months ago Chronic diarrhea   [place] [facility] [place], Megan P, DO   6 months ago Type 2 diabetes mellitus with cardiac complication [facility])   [place] [place], Megan P, DO   7 months ago Encounter for annual wellness exam in Medicare patient   [place] [facility] [place], Megan P, DO   9 months  ago Controlled type 2 diabetes mellitus without complication, with long-term current use of insulin (HCC)   [place] [facility] [place], Oralia Rud, DO       Future Appointments             In 1 month Johnson, Oralia Rud, DO [place] [facility], [facility]

## 2024-02-03 NOTE — Progress Notes (Signed)
   02/03/2024  Patient ID: [name], female   DOB: 1946/10/29, 78 y.o.   MRN: [id number]  Patient out reach to continue discussing yesterday's discussion around diabetes management plan:  -Patient will resume Ozempic 1 mg weekly, and use ondansetron 4 mg every 8 hours if needed for nausea the first day or 2 after the injection -Increase Semglee to 20 units at bedtime -Decrease Humulin R to 10 units twice daily and immediately before breakfast and supper -Continue to monitor fasting blood glucose and 2-hour postprandial blood glucose daily and record values  Telephone follow-up scheduled for Thursday, March 6 prior to patient's PCP visit the following week  [name], PharmD, DPLA

## 2024-02-03 NOTE — Telephone Encounter (Signed)
Call to patient- patient states she has been using mail order and she is not sure where she got her last insulin refill. She does report she is out and did not have medication yesterday. Patient advised I will send Rx again to [facility] for her.  Requested Prescriptions  Pending Prescriptions Disp Refills   insulin regular (HUMULIN R) 100 units/mL injection 10 mL 0    Sig: Inject 0.07 mLs (7 Units total) into the skin 2 (two) times daily before a meal.     Endocrinology:  Diabetes - Insulins Passed - 02/03/2024  3:12 PM      Passed - HBA1C is between 0 and 7.9 and within 180 days    Hemoglobin A1C  Date Value Ref Range Status  04/24/2023 7.9  Final   HB A1C (BAYER DCA - WAIVED)  Date Value Ref Range Status  12/05/2023 7.0 (H) 4.8 - 5.6 % Final    Comment:             Prediabetes: 5.7 - 6.4          Diabetes: >6.4          Glycemic control for adults with diabetes: <7.0          Passed - Valid encounter within last 6 months    Recent Outpatient Visits           2 months ago Type 2 diabetes mellitus with cardiac complication [facility])   [facility] [place], Megan P, DO   4 months ago Chronic diarrhea   [place] [facility] [place], Megan P, DO   6 months ago Type 2 diabetes mellitus with cardiac complication [facility])   [place] [facility] [place], Megan P, DO   7 months ago Encounter for annual wellness exam in Medicare patient   [place] [facility] [place], Megan P, DO   9 months ago Controlled type 2 diabetes mellitus without complication, with long-term current use of insulin (HCC)   [place] [facility] [place], [place], DO       Future Appointments             In 1 month Johnson, Megan P, DO [place] [facility], [facility]             nortriptyline (PAMELOR) 25 MG capsule 60 capsule     Sig: Take 2 capsules (50 mg total) by mouth daily.     Psychiatry:  Antidepressants -  Heterocyclics (TCAs) Passed - 02/03/2024  3:12 PM      Passed - Completed PHQ-2 or PHQ-9 in the last 360 days      Passed - Valid encounter within last 6 months    Recent Outpatient Visits           2 months ago Type 2 diabetes mellitus with cardiac complication [facility])   [place] [facility] [place], Megan P, DO   4 months ago Chronic diarrhea   [place] [facility] [place], Megan P, DO   6 months ago Type 2 diabetes mellitus with cardiac complication [facility])   [place] [facility] [place], Megan P, DO   7 months ago Encounter for annual wellness exam in Medicare patient   [place] [facility] [place], Megan P, DO   9 months ago Controlled type 2 diabetes mellitus without complication, with long-term current use of insulin [facility])    [facility] [place], [place],  DO       Future Appointments             In 1 month Johnson, Oralia Rud, DO [place] [facility], [facility]

## 2024-02-03 NOTE — Telephone Encounter (Signed)
Received fax from [facility] patient assistance that Farxiga medication shipment has been sent to patient's address.

## 2024-02-04 MED ORDER — NORTRIPTYLINE HCL 50 MG PO CAPS: 50.0000 mg | ORAL_CAPSULE | Freq: Every day | ORAL | 0 refills | Status: DC

## 2024-02-05 DIAGNOSIS — I5022 Chronic systolic (congestive) heart failure: Secondary | ICD-10-CM | POA: Diagnosis not present

## 2024-02-05 DIAGNOSIS — N2581 Secondary hyperparathyroidism of renal origin: Secondary | ICD-10-CM | POA: Diagnosis not present

## 2024-02-05 DIAGNOSIS — E1122 Type 2 diabetes mellitus with diabetic chronic kidney disease: Secondary | ICD-10-CM | POA: Diagnosis not present

## 2024-02-05 DIAGNOSIS — N1832 Chronic kidney disease, stage 3b: Secondary | ICD-10-CM | POA: Diagnosis not present

## 2024-02-06 ENCOUNTER — Telehealth (HOSPITAL_BASED_OUTPATIENT_CLINIC_OR_DEPARTMENT_OTHER): Payer: Self-pay | Admitting: Licensed Clinical Social Worker

## 2024-02-06 NOTE — Telephone Encounter (Signed)
H&V Care Navigation CSW Progress Note  Clinical Social Worker contacted patient by phone to f/u on referral for grief and mental health supportive resources. Was unable to reach pt today at [phone], left voicemail letting her know I was checking in on her. Will re-attempt again next week if no call back.    Patient is participating in a Managed Medicaid Plan:  No, [employer]  SDOH Screenings   Food Insecurity: No Food Insecurity (01/28/2024)  Housing: Low Risk  (01/28/2024)  Transportation Needs: No Transportation Needs (01/28/2024)  Utilities: Not At Risk (01/28/2024)  Depression (PHQ2-9): Low Risk  (12/05/2023)  Financial Resource Strain: Low Risk  (01/28/2024)  Stress: Stress Concern Present (01/28/2024)  Tobacco Use: High Risk (02/05/2024)   Received from [facility]  Health Literacy: Adequate Health Literacy (01/28/2024)    Octavio Graves, MSW, LCSW Clinical Social Worker II [facility] Health Heart/Vascular Care Navigation  [phone]- work cell phone (preferred) [phone]- desk phone

## 2024-02-09 ENCOUNTER — Other Ambulatory Visit: Payer: Self-pay

## 2024-02-09 ENCOUNTER — Telehealth: Payer: Self-pay | Admitting: Licensed Clinical Social Worker

## 2024-02-09 MED ORDER — DAPAGLIFLOZIN PROPANEDIOL 10 MG PO TABS: 10.0000 mg | ORAL_TABLET | Freq: Every day | ORAL | 3 refills | Status: AC

## 2024-02-09 MED ORDER — DAPAGLIFLOZIN PROPANEDIOL 10 MG PO TABS: 10.0000 mg | ORAL_TABLET | Freq: Every day | ORAL | 3 refills | Status: DC

## 2024-02-09 NOTE — Telephone Encounter (Signed)
H&V Care Navigation CSW Progress Note  Clinical Social Worker contacted patient by phone to f/u on resources for support. Was able to reach her today at [phone]. Pt shares she is doing well, has been using her time to bake pies and candy to support family members going through recent losses. She hasn't had time to call [facility] but does have it on her to do list. Encouraged her considering doing that this week, if still finding herself having a hard time regarding recent and multiple family losses.   She was happy to report that she has quit smoking entirely for the past few weeks since receiving the patches. She is very pleased with this result, she has been doing puzzles, baking and coloring to keep herself occupied when not doing things with family. Congratulated her on this big accomplishment and encouraged her to continue doing things such as those that that she has found supportive for her mental health - as they in turn benefit her physical health. No additional questions/concerns today. I will f/u as agreed upon in a few weeks to provide additional check in support.   Patient is participating in a Managed Medicaid Plan:  No, [employer]  SDOH Screenings   Food Insecurity: No Food Insecurity (01/28/2024)  Housing: Low Risk  (01/28/2024)  Transportation Needs: No Transportation Needs (01/28/2024)  Utilities: Not At Risk (01/28/2024)  Depression (PHQ2-9): Low Risk  (12/05/2023)  Financial Resource Strain: Low Risk  (01/28/2024)  Stress: Stress Concern Present (01/28/2024)  Tobacco Use: High Risk (02/05/2024)   Received from [facility]  Health Literacy: Adequate Health Literacy (01/28/2024)    Octavio Graves, MSW, LCSW Clinical Social Worker II [facility] Health Heart/Vascular Care Navigation  [phone]- work cell phone (preferred) [phone]- desk phone

## 2024-02-10 ENCOUNTER — Telehealth: Payer: Self-pay

## 2024-02-10 ENCOUNTER — Ambulatory Visit: Payer: Medicare Other

## 2024-02-10 DIAGNOSIS — Z0000 Encounter for general adult medical examination without abnormal findings: Secondary | ICD-10-CM

## 2024-02-10 NOTE — Telephone Encounter (Signed)
Returned patient's call about health coaching appointment originally scheduled today at 9:00am. Patient was running errands due to the pending inclement weather. Patient was preparing food and on another call at this time. Patient has been rescheduled for 2/24 at 10:00am. Patient will be called at that time.    [name], MS, ERHD, [facility]  Care Guide, Health & Wellness Coach [address] [place] [address] Telephone: [phone] Email: [email or web address]

## 2024-02-10 NOTE — Telephone Encounter (Signed)
Called patient as scheduled to hold health coaching session over the phone. Patient did not answer. Left a message for patient to return call to keep appointment for today or to reschedule if necessary.    [name], MS, ERHD, [facility]  Care Guide, Health & Wellness Coach [address] [place] [address] Telephone: [phone] Email: [name]

## 2024-02-16 ENCOUNTER — Ambulatory Visit (INDEPENDENT_AMBULATORY_CARE_PROVIDER_SITE_OTHER): Payer: Medicare Other

## 2024-02-16 ENCOUNTER — Telehealth (HOSPITAL_BASED_OUTPATIENT_CLINIC_OR_DEPARTMENT_OTHER): Payer: Self-pay

## 2024-02-16 ENCOUNTER — Ambulatory Visit (HOSPITAL_BASED_OUTPATIENT_CLINIC_OR_DEPARTMENT_OTHER): Payer: Medicare Other

## 2024-02-16 ENCOUNTER — Telehealth: Payer: Self-pay | Admitting: Family

## 2024-02-16 DIAGNOSIS — Z0000 Encounter for general adult medical examination without abnormal findings: Secondary | ICD-10-CM

## 2024-02-16 NOTE — Telephone Encounter (Signed)
 Returned patient's call from voicemail she left this morning at 7:23am stating that she has a doctor's appointment scheduled this morning as well and would like to reschedule her health coaching appointment for later today after lunchtime. Patient confirmed verbally over the phone that 2:15pm was a good time to hold her health coaching appointment over the phone. Patient's appointment has been rescheduled accordingly.   [name], MS, ERHD, [facility]  Care Guide, Health & Wellness Coach [address] [place] [address] Telephone: [phone] Email: [email or web address]

## 2024-02-16 NOTE — Progress Notes (Unsigned)
 Advanced Heart Failure Clinic Note    PCP: Olevia Perches, DO (last seen 12/24) Primary Cardiologist: Leanora Ivanoff PA (last seen 01/25)  Chief Complaint: shortness of breath   HPI:  Kelsey Mcdowell is a 78 yo with a PMHx of COPD, acute on chronic systolic CHF, DM II, tobacco abuse, obesity, and HTN.  Admitted 04/24/23 due to several days of diarrhea, decreased PO intake and worsening weakness. Found to have oliguric AKI. Held furosemide, aldactone, entresto, hydrochlorothiazide, dapagliflozin during hospital stay.   Echo 01/28/22: EF 40-45% LV with mildly decreased function and global hypokinesis with mild LVH.  Echo 08/20/23: EF 60-65% with mild LVH, Grade I DD  She presents today for a HF follow-up visit with a chief complaint of minimal shortness of breath. Has continued fatigue and pedal edema along with this. Denies chest pain, cough, palpitations, abdominal distention, pedal edema, dizziness or weight gain. Has been eating out more often lately.    No oxygen for ~ 2 months due to change in insurance company.   No smoking for last 2 weeks. Continues to wear nicotine patch.    ROS: All systems negative except as listed in HPI, PMH and Problem List.  SH:  Social History   Socioeconomic History   Marital status: Widowed    Spouse name: Not on file   Number of children: Not on file   Years of education: Not on file   Highest education level: Not on file  Occupational History   Not on file  Tobacco Use   Smoking status: Every Day    Current packs/day: 0.00    Types: Cigarettes    Last attempt to quit: 12/31/2022    Years since quitting: 1.1   Smokeless tobacco: Never  Vaping Use   Vaping status: Never Used  Substance and Sexual Activity   Alcohol use: Yes    Comment: occasional   Drug use: No   Sexual activity: Not on file  Other Topics Concern   Not on file  Social History Narrative   Not on file   Social Drivers of Health   Financial Resource Strain: Low Risk   (01/28/2024)   Overall Financial Resource Strain (CARDIA)    Difficulty of Paying Living Expenses: Not hard at all  Food Insecurity: No Food Insecurity (01/28/2024)   Hunger Vital Sign    Worried About Running Out of Food in the Last Year: Never true    Ran Out of Food in the Last Year: Never true  Transportation Needs: No Transportation Needs (01/28/2024)   PRAPARE - Administrator, Civil Service (Medical): No    Lack of Transportation (Non-Medical): No  Physical Activity: Not on file  Stress: Stress Concern Present (01/28/2024)   [employer] of Occupational Health - Occupational Stress Questionnaire    Feeling of Stress : To some extent  Social Connections: Not on file  Intimate Partner Violence: Not on file    FH:  Family History  Problem Relation Age of Onset   Cancer Mother    Hypertension Mother    Diabetes Mother    Heart disease Father    Diabetes Father    Cancer Sister    Breast cancer Sister 82   Heart disease Brother     Past Medical History:  Diagnosis Date   Arthritis    CHF (congestive heart failure) (HCC)    COPD (chronic obstructive pulmonary disease) (HCC)    Diabetes mellitus without complication (HCC)    GERD (gastroesophageal reflux  disease)    Hyperlipemia    Hypertension    Myocardial infarction (HCC)    light heart attack ?when   Neuropathy    Sleep apnea     Current Outpatient Medications  Medication Sig Dispense Refill   acetaminophen (TYLENOL) 500 MG tablet Take 500 mg by mouth every 6 (six) hours as needed (back pain).     Blood Glucose Monitoring Suppl (ONE TOUCH ULTRA 2) w/Device KIT 1 each by Does not apply route daily at 2 PM. 1 kit 0   cholecalciferol (VITAMIN D3) 25 MCG (1000 UNIT) tablet Take 1,000 Units by mouth daily.     dapagliflozin propanediol (FARXIGA) 10 MG TABS tablet Take 1 tablet (10 mg total) by mouth daily before breakfast. 90 tablet 3   DULoxetine (CYMBALTA) 20 MG capsule Take 1 capsule (20 mg total) by  mouth daily. 90 capsule 1   fluticasone (FLONASE) 50 MCG/ACT nasal spray Place 2 sprays into both nostrils daily. 16 g 12   gabapentin (NEURONTIN) 300 MG capsule Take 1 cap in the AM and 2 in the PM 270 capsule 1   glucose blood (ONETOUCH ULTRA) test strip 1 each by Other route daily at 2 PM. Use as instructed 100 each 3   Insulin Pen Needle 34G X 3.5 MM MISC 1 Dose by Does not apply route daily. 100 each 0   insulin regular (HUMULIN R) 100 units/mL injection Inject 0.07 mLs (7 Units total) into the skin 2 (two) times daily before a meal. (Patient taking differently: Inject 10 Units into the skin 2 (two) times daily before a meal. Before breakfast and supper) 10 mL 0   Lancets (ONETOUCH ULTRASOFT) lancets 1 each by Other route daily at 2 PM. Use as instructed 100 each 3   metoprolol succinate (TOPROL-XL) 25 MG 24 hr tablet Take 1 tablet (25 mg total) by mouth daily. 180 tablet 1   Multiple Vitamin (MULTIVITAMIN WITH MINERALS) TABS tablet Take 1 tablet by mouth daily.     nicotine (NICODERM CQ - DOSED IN MG/24 HOURS) 21 mg/24hr patch Place 1 patch (21 mg total) onto the skin daily. Please be sure to remove the old patch from your skin before applying the new one. 28 patch 3   nortriptyline (PAMELOR) 50 MG capsule Take 1 capsule (50 mg total) by mouth daily. 90 capsule 0   omeprazole (PRILOSEC) 20 MG capsule Take 1 capsule (20 mg total) by mouth daily. 90 capsule 3   ondansetron (ZOFRAN-ODT) 4 MG disintegrating tablet Take 1 tablet (4 mg total) by mouth every 8 (eight) hours as needed for nausea or vomiting. 60 tablet 2   ONETOUCH VERIO test strip 1 each by Other route 3 (three) times daily. 100 each 12   rosuvastatin (CRESTOR) 10 MG tablet Take 1 tablet (10 mg total) by mouth at bedtime. 90 tablet 1   sacubitril-valsartan (ENTRESTO) 24-26 MG Take 1 tablet by mouth 2 (two) times daily. 180 tablet 3   Secukinumab (COSENTYX [place]) Inject into the skin every 30 (thirty) days. Last date give was the week of  11/25/22. Pt unsure of exact date.     Semaglutide, 1 MG/DOSE, (OZEMPIC, 1 MG/DOSE,) 4 MG/3ML SOPN Inject 1 mg into the skin once a week.     SEMGLEE, YFGN, 100 UNIT/ML Pen Inject 20 Units into the skin at bedtime.     spironolactone (ALDACTONE) 25 MG tablet Take 1/2 (one-half) tablet by mouth once daily 15 tablet 5   traZODone (DESYREL) 50 MG tablet  Take 0.5-1 tablets (25-50 mg total) by mouth at bedtime as needed for sleep. 30 tablet 3   triamcinolone ointment (KENALOG) 0.5 % Apply 1 Application topically daily as needed.     vitamin B-12 (CYANOCOBALAMIN) 1000 MCG tablet Take 1,000 mcg by mouth daily.     No current facility-administered medications for this visit.   Vitals:   02/17/24 1011  BP: (!) 142/71  Pulse: 80  SpO2: 96%  Weight: 189 lb (85.7 kg)   Wt Readings from Last 3 Encounters:  02/17/24 189 lb (85.7 kg)  01/13/24 191 lb (86.6 kg)  12/05/23 188 lb 3.2 oz (85.4 kg)   Lab Results  Component Value Date   CREATININE 1.38 (H) 10/14/2023   CREATININE 1.54 (H) 08/05/2023   CREATININE 1.74 (H) 05/08/2023    PHYSICAL EXAM:  General: Well appearing. No resp difficulty HEENT: normal Neck: supple, no JVD Cor: Regular rhythm, rate. No rubs, gallops or murmurs Lungs: clear Abdomen: soft, nontender, nondistended. Extremities: no cyanosis, clubbing, rash, edema Neuro: alert & oriented X 3. Moves all 4 extremities w/o difficulty. Affect pleasant   ECG: not done   ASSESSMENT & PLAN:  NICM with preserved ejection fraction- - likely due to to HTN/ COPD - NYHA class II - euvolemic - weighing most days; reviewed the importance of weighing daily and to call for an overnight weight gain of > 2 pounds or a weekly weight gain of > 5 pounds - weight down 2 pounds from last visit here 1 month ago - echo 01/28/22: EF 40-45% LV with mildly decreased function and global hypokinesis with mild LVH - echo 08/20/23: EF 60-65% with mild LVH, Grade I DD (reviewed this with patient  today) - continue farxiga 10mg  daily - continue metoprolol succinate 25mg  daily - continue entresto 24/26mg  BID - increased spironolactone to 25mg  daily  - BMET in 7-10 days & then, again at next visit - not adding salt to her food but has been eating out more often - elevate legs when sitting for long periods of time; she was unable to tolerate compression socks - saw cardiology Mellissa Kohut) 01/25 - encouraged as much activity as possible - BNP 01/27/22 721.9  Hypertension- - BP 142/71 - increasing spiro per above - saw PCP [name]) 12/24 - saw nephrology Cherylann Ratel) 02/25 - BMP 02/05/24 reviewed and showed sodium 140, potassium 4.4, creatinine 1.45 and GFR 37 - BMET in 7-10 days with spiro titration  COPD- - saw pulmonologist Meredeth Ide) 11/24 - currently doesn't have oxygen as her insurance company changed oxygen providers and she hasn't had oxygen for ~ 2 months;  - she was unable to commit to pulmonary rehab because of her living 1/2 hour away from [facility] and gas is too expensive  DM- - A1c 04/24/23 was 7.9%  - ozempic 1mg  weekly; managed by PCP - saw nephrology Cherylann Ratel) 02/25  5: Tobacco use- - using nicotine patch 21mg  daily; discussed decreasing this done at next visit - no smoking for last 2 weeks - participating with health coach and feels like it's been helpful   Return in 1 month, sooner if needed.   Delma Freeze, FNP 02/16/24

## 2024-02-16 NOTE — Telephone Encounter (Signed)
 Pt confirmed appt for 02/17/24

## 2024-02-16 NOTE — Progress Notes (Unsigned)
 Appointment Outcome: Completed, Session #: 3                       Start time: 2:16pm   End time: 3:00pm   Total Mins: 44 minutes  AGREEMENTS SECTION   Overall Goal(s): Quit smoking with the aid of nicotine replacement therapy and managing stress to refrain from relapsing.                                         Agreement/Action Steps:  Smoking cessation (Quit date - TBD) Wear nicotine patches as prescribed Refrain from smoking after beginning use of nicotine patches Drink water when the nicotine cravings start Engage in chair exercise for 5 minutes when nicotine cravings start     Stress management Identify new baking/candy recipes for book Continue to bake and make candy as a hobby   Progress Notes:  Patient was able to successfully use patches within the past two weeks and not smoke.  Patient is currently using the 21mg  patch and have 2 weeks left to complete before moving to 14 mg.  Patient reports doing okay doing the day while being busy with her stress management techniques but at night she thinks about her husband that has passed.  Patient stated that her mood contributed to her increased smoking and now she is feeling various emotions.   Coaching Outcomes: Patient was encouraged again to consider local mental health resources for support and to talk to her doctor at her next visit for medical advice.  Patient verbally expressed understanding.  Patient will continue to implement health coaching agreement over the next two weeks.    Attempted: Fulfilled - Patient completed the bi-weekly agreement in full and was able to meet the challenge.

## 2024-02-17 ENCOUNTER — Ambulatory Visit: Payer: Medicare Other | Attending: Family | Admitting: Family

## 2024-02-17 ENCOUNTER — Encounter: Payer: Self-pay | Admitting: Family

## 2024-02-17 VITALS — BP 142/71 | HR 80 | Wt 189.0 lb

## 2024-02-17 DIAGNOSIS — I428 Other cardiomyopathies: Secondary | ICD-10-CM | POA: Diagnosis not present

## 2024-02-17 DIAGNOSIS — Z720 Tobacco use: Secondary | ICD-10-CM

## 2024-02-17 DIAGNOSIS — R0602 Shortness of breath: Secondary | ICD-10-CM | POA: Diagnosis present

## 2024-02-17 DIAGNOSIS — H35033 Hypertensive retinopathy, bilateral: Secondary | ICD-10-CM | POA: Diagnosis not present

## 2024-02-17 DIAGNOSIS — F17290 Nicotine dependence, other tobacco product, uncomplicated: Secondary | ICD-10-CM | POA: Diagnosis not present

## 2024-02-17 DIAGNOSIS — E669 Obesity, unspecified: Secondary | ICD-10-CM | POA: Insufficient documentation

## 2024-02-17 DIAGNOSIS — J449 Chronic obstructive pulmonary disease, unspecified: Secondary | ICD-10-CM | POA: Diagnosis not present

## 2024-02-17 DIAGNOSIS — Z794 Long term (current) use of insulin: Secondary | ICD-10-CM | POA: Diagnosis not present

## 2024-02-17 DIAGNOSIS — I110 Hypertensive heart disease with heart failure: Secondary | ICD-10-CM | POA: Insufficient documentation

## 2024-02-17 DIAGNOSIS — E1122 Type 2 diabetes mellitus with diabetic chronic kidney disease: Secondary | ICD-10-CM | POA: Diagnosis not present

## 2024-02-17 DIAGNOSIS — H34811 Central retinal vein occlusion, right eye, with macular edema: Secondary | ICD-10-CM | POA: Diagnosis not present

## 2024-02-17 DIAGNOSIS — I10 Essential (primary) hypertension: Secondary | ICD-10-CM | POA: Diagnosis not present

## 2024-02-17 DIAGNOSIS — I5032 Chronic diastolic (congestive) heart failure: Secondary | ICD-10-CM | POA: Insufficient documentation

## 2024-02-17 DIAGNOSIS — N1831 Chronic kidney disease, stage 3a: Secondary | ICD-10-CM

## 2024-02-17 DIAGNOSIS — H43823 Vitreomacular adhesion, bilateral: Secondary | ICD-10-CM | POA: Diagnosis not present

## 2024-02-17 DIAGNOSIS — E113313 Type 2 diabetes mellitus with moderate nonproliferative diabetic retinopathy with macular edema, bilateral: Secondary | ICD-10-CM | POA: Diagnosis not present

## 2024-02-17 DIAGNOSIS — I5023 Acute on chronic systolic (congestive) heart failure: Secondary | ICD-10-CM | POA: Diagnosis present

## 2024-02-17 DIAGNOSIS — E119 Type 2 diabetes mellitus without complications: Secondary | ICD-10-CM | POA: Diagnosis not present

## 2024-02-17 LAB — HM DIABETES EYE EXAM

## 2024-02-17 NOTE — Patient Instructions (Addendum)
 Medication Changes:  INCREASE YOUR SPIRONOLACTONE TO 25 MG ONCE DAILY  Lab Work:  Go over to the MEDICAL MALL. Please check in at registration first. Go pass the gift shop and have your blood work completed IN  1 WEEK.  We will only call you if the results are abnormal or if the provider would like to make medication changes.   Follow-Up in: 1 month with Kelsey Kindred, FNP  At the Advanced Heart Failure Clinic, you and your health needs are our priority. We have a designated team specialized in the treatment of Heart Failure. This Care Team includes your primary Heart Failure Specialized Cardiologist (physician), Advanced Practice Providers (APPs- Physician Assistants and Nurse Practitioners), and Pharmacist who all work together to provide you with the care you need, when you need it.   You may see any of the following providers on your designated Care Team at your next follow up:  Kelsey Kindred, FNP Dr. Arvilla Mcdowell Dr. Marca Mcdowell Dr. Dorthula Mcdowell Dr. Theresia Mcdowell Kelsey Becket, NP Kelsey Mcdowell, [place] [facility] Kelsey Mcdowell, [place] Kelsey Peon, NP Kelsey Lee, NP Kelsey Mcdowell, PharmD   Please be sure to bring in all your medications bottles to every appointment.   Need to Contact us:  If you have any questions or concerns before your next appointment please send [place] a message through [place] or call our office at [phone].    TO LEAVE A MESSAGE FOR THE NURSE SELECT OPTION 2, PLEASE LEAVE A MESSAGE INCLUDING: YOUR NAME DATE OF BIRTH CALL BACK NUMBER REASON FOR CALL**this is important as we prioritize the call backs  YOU WILL RECEIVE A CALL BACK THE SAME DAY AS LONG AS YOU CALL BEFORE 4:00 PM

## 2024-02-21 DIAGNOSIS — J449 Chronic obstructive pulmonary disease, unspecified: Secondary | ICD-10-CM | POA: Diagnosis not present

## 2024-02-24 ENCOUNTER — Telehealth: Payer: Self-pay | Admitting: Licensed Clinical Social Worker

## 2024-02-24 NOTE — Telephone Encounter (Signed)
 H&V Care Navigation CSW Progress Note  Clinical Social Worker contacted patient by phone to f/u on referral for grief and mental health supportive resources- pt previously mentioned she may call [facility] to be re-established when able. Was unable to reach pt today at [phone], left voicemail letting her know I was checking in on her. Will re-attempt again next week if no call back.    Patient is participating in a Managed Medicaid Plan:  No, [employer] only  SDOH Screenings   Food Insecurity: No Food Insecurity (01/28/2024)  Housing: Low Risk  (01/28/2024)  Transportation Needs: No Transportation Needs (01/28/2024)  Utilities: Not At Risk (01/28/2024)  Depression (PHQ2-9): Low Risk  (12/05/2023)  Financial Resource Strain: Low Risk  (01/28/2024)  Stress: Stress Concern Present (01/28/2024)  Tobacco Use: High Risk (02/17/2024)  Health Literacy: Adequate Health Literacy (01/28/2024)    Octavio Graves, MSW, LCSW Clinical Social Worker II [facility] Health Heart/Vascular Care Navigation  [phone]- work cell phone (preferred) [phone]- desk phone

## 2024-02-26 ENCOUNTER — Other Ambulatory Visit: Payer: Self-pay

## 2024-02-26 NOTE — Progress Notes (Signed)
   02/26/2024  Patient ID: Kelsey Mcdowell, female   DOB: October 06, 1946, 78 y.o.   MRN: [id number]  Subjective/objective Telephone visit to follow-up on management of type 2 diabetes  Diabetes management plan -Current medications: Farxiga 10 mg daily, Humulin R 10 units daily before breakfast and supper, Semglee 20 units at bedtime, Ozempic 1 mg weekly -Patient does monitor home blood glucose and endorses fasting blood glucose ranging 80-120.  Postprandial values(unsure how long after eating) remain in the upper 100s to low 200s. -Patient does not endorse any signs or symptoms of hypoglycemia -Patient states she is tolerating Ozempic 1 mg much better now, and she has only had to use 1 dose of ondansetron since we spoke approximately 4 weeks ago -Last A1c was 7% on 12/13 -Patient recently approved for Medicare extra help LIS, which makes brand co-pays approximately $12.60.  She also endorses recently receiving a Medicaid card in the mail, but she did not think she was eligible for this program.  Assessment/plan  Diabetes management plan -Continue current regimen at this time -Continue regular monitoring of home blood glucose -Patient sees Dr. [name] again 3/13 and will be due for follow-up A1c -Provided patient with Medicaid caseworker name and phone number for [facility], so she can call to verify Medicaid benefits  Follow-up: 3 months  Kelsey Mcdowell, PharmD, DPLA

## 2024-02-27 ENCOUNTER — Telehealth (HOSPITAL_BASED_OUTPATIENT_CLINIC_OR_DEPARTMENT_OTHER): Payer: Self-pay | Admitting: Licensed Clinical Social Worker

## 2024-02-27 NOTE — Telephone Encounter (Signed)
 H&V Care Navigation CSW Progress Note  Clinical Social Worker contacted patient by phone to f/u on referral for grief and mental health supportive resources- pt previously mentioned she may call [facility] to be re-established when able. Was unable to reach pt today at [phone], left 2nd voicemail letting her know I was checking in on her. Will re-attempt again one more time as able.   Patient is participating in a Managed Medicaid Plan:  No, UHC Medicare only at this time. Note from pharmacy call pt may have received a Medicaid card  SDOH Screenings   Food Insecurity: No Food Insecurity (01/28/2024)  Housing: Low Risk  (01/28/2024)  Transportation Needs: No Transportation Needs (01/28/2024)  Utilities: Not At Risk (01/28/2024)  Depression (PHQ2-9): Low Risk  (12/05/2023)  Financial Resource Strain: Low Risk  (01/28/2024)  Stress: Stress Concern Present (01/28/2024)  Tobacco Use: High Risk (02/17/2024)  Health Literacy: Adequate Health Literacy (01/28/2024)    Octavio Graves, MSW, LCSW Clinical Social Worker II [facility] Health Heart/Vascular Care Navigation  [phone]- work cell phone (preferred) [phone]- desk phone

## 2024-03-01 ENCOUNTER — Ambulatory Visit (HOSPITAL_BASED_OUTPATIENT_CLINIC_OR_DEPARTMENT_OTHER): Payer: Medicare Other

## 2024-03-01 ENCOUNTER — Encounter (HOSPITAL_BASED_OUTPATIENT_CLINIC_OR_DEPARTMENT_OTHER): Payer: Self-pay

## 2024-03-01 DIAGNOSIS — Z0000 Encounter for general adult medical examination without abnormal findings: Secondary | ICD-10-CM

## 2024-03-01 NOTE — Progress Notes (Signed)
 Appointment Outcome: Completed, Session #: 4                        Start time: 2:01pm   End time: 2:26pm    Total Mins: 25 minutes  AGREEMENTS SECTION   Overall Goal(s): Quit smoking with the aid of nicotine replacement therapy and managing stress to refrain from relapsing.                                      Agreement/Action Steps:  Smoking cessation (Quit date - TBD) Wear nicotine patches as prescribed Refrain from smoking after beginning use of nicotine patches Drink water when the nicotine cravings start Engage in chair exercise for 5 minutes when nicotine cravings start    Stress management Identify new baking/candy recipes for book Continue to bake and make candy as a hobby    Progress Notes:  Patient reported that she has remained smoke free since her last health coaching appointment and that it is starting to become easier each day and even at night now. Patient mentioned that drinking 1/2 bottle of cold water at a time has been really helpful in managing her cravings here and there for nicotine.  Patient reminds herself when she has an urge to smoke that she does not want to go back on oxygen and the price she had to pay for cigarettes. Patient also tells herself how smoking is not healthy for her either to continue to motivate her to remain smoke free.   Patient stated that during the day she stays focus on what she is doing or has to do and provided the example of today that she is baking pies and cookies with her great niece. Patient stated that at night when the thought of smoking comes up, she focuses on what she is watching on tv and it the urge to smoke goes away.  Patient reported that she had forgotten about the exercising as a distraction but wants to start doing them because she wants to lose weight as well. Patient expressed that incorporating chair exercises will help keep her mind busy, relieve stress, and increase her physical activity.  Patient plans to  reward herself with a trip to the beach with the money she will save from not buying cigarettes.  Patient will start using the 14 mg patches on Wednesday 3/12.     Coaching Outcomes: Patient plans to incorporate chair exercises at least 3 days per week for a minimum of 5 minutes and a maximum of as long as she can tolerate. Patient 3 days she chooses to exercise will depend on schedule/doctor's visits and will vary weekly.  Patient will continue to implement all other action steps in her health coaching agreement as outline above over the next two weeks without revision.    Attempted: Fulfilled - Patient completed the bi-weekly agreement in full and was able to meet the challenge.   Referrals: N/A  Resources: N/A

## 2024-03-02 ENCOUNTER — Telehealth: Payer: Self-pay | Admitting: Licensed Clinical Social Worker

## 2024-03-02 NOTE — Telephone Encounter (Signed)
 H&V Care Navigation CSW Progress Note  Clinical Social Worker contacted patient by phone to f/u again on community support resources. Was able to reach her today at [phone]. She shares she is doing well, has not utilized resources but has been feeling in a better mental state. She has found ways to use her time that are both relaxing and productive. She has been successful with her smoking cessation at this time. She shares she "doesn't walk around trying to figure out what I should be doing with my time anymore."  Provided her verbal encouragement for the work she has put in so far and her positive use of coping strategies. She still confirms she has those supportive resources as needed, she was encouraged to call me as needed moving forward with any care needs or community resources. Also encouraged to complete health coaching sessions. No additional questions at this time.   Patient is participating in a Managed Medicaid Plan:  No, [employer]  SDOH Screenings   Food Insecurity: No Food Insecurity (01/28/2024)  Housing: Low Risk  (01/28/2024)  Transportation Needs: No Transportation Needs (01/28/2024)  Utilities: Not At Risk (01/28/2024)  Depression (PHQ2-9): Low Risk  (12/05/2023)  Financial Resource Strain: Low Risk  (01/28/2024)  Stress: Stress Concern Present (01/28/2024)  Tobacco Use: Medium Risk (03/01/2024)  Health Literacy: Adequate Health Literacy (01/28/2024)    Octavio Graves, MSW, LCSW Clinical Social Worker II [facility] Health Heart/Vascular Care Navigation  [phone]- work cell phone (preferred) [phone]- desk phone

## 2024-03-04 ENCOUNTER — Ambulatory Visit: Payer: Medicare Other | Admitting: Family Medicine

## 2024-03-04 ENCOUNTER — Encounter: Payer: Self-pay | Admitting: Family Medicine

## 2024-03-04 ENCOUNTER — Other Ambulatory Visit: Payer: Self-pay | Admitting: Family Medicine

## 2024-03-04 VITALS — BP 128/78 | HR 89 | Temp 98.4°F | Resp 17 | Wt 187.0 lb

## 2024-03-04 DIAGNOSIS — E1159 Type 2 diabetes mellitus with other circulatory complications: Secondary | ICD-10-CM | POA: Diagnosis not present

## 2024-03-04 DIAGNOSIS — Z7984 Long term (current) use of oral hypoglycemic drugs: Secondary | ICD-10-CM | POA: Diagnosis not present

## 2024-03-04 DIAGNOSIS — I5032 Chronic diastolic (congestive) heart failure: Secondary | ICD-10-CM | POA: Diagnosis not present

## 2024-03-04 LAB — BAYER DCA HB A1C WAIVED: HB A1C (BAYER DCA - WAIVED): 7.7 % — ABNORMAL HIGH (ref 4.8–5.6)

## 2024-03-04 MED ORDER — SEMAGLUTIDE (2 MG/DOSE) 8 MG/3ML ~~LOC~~ SOPN: 2.0000 mg | PEN_INJECTOR | SUBCUTANEOUS | 1 refills | Status: DC

## 2024-03-04 NOTE — Assessment & Plan Note (Addendum)
 A1c elevated at 7.7. Will increase her ozempic to 2mg  and continue sliding scale of humalin. Continue semglee. Recheck 1 month. Call with any concerns.

## 2024-03-04 NOTE — Telephone Encounter (Signed)
 Requested Prescriptions  Pending Prescriptions Disp Refills   insulin regular (HUMULIN R) 100 units/mL injection [Pharmacy Med Name: HumuLIN R 100 UNIT/ML Injection Solution] 10 mL 0    Sig: Inject 0.1 mLs (10 Units total) into the skin 2 (two) times daily before a meal. Before breakfast and supper     Endocrinology:  Diabetes - Insulins Passed - 03/04/2024  3:34 PM      Passed - HBA1C is between 0 and 7.9 and within 180 days    Hemoglobin A1C  Date Value Ref Range Status  04/24/2023 7.9  Final   HB A1C (BAYER DCA - WAIVED)  Date Value Ref Range Status  12/05/2023 7.0 (H) 4.8 - 5.6 % Final    Comment:             Prediabetes: 5.7 - 6.4          Diabetes: >6.4          Glycemic control for adults with diabetes: <7.0          Passed - Valid encounter within last 6 months    Recent Outpatient Visits           3 months ago Type 2 diabetes mellitus with cardiac complication [facility])   [place] [facility] [place], Megan P, DO   5 months ago Chronic diarrhea   [facility] [place], Megan P, DO   7 months ago Type 2 diabetes mellitus with cardiac complication [facility])   [place] [facility] [place], Megan P, DO   8 months ago Encounter for annual wellness exam in Medicare patient   [place] [facility] [place], Megan P, DO   10 months ago Controlled type 2 diabetes mellitus without complication, with long-term current use of insulin [facility])   [place] [facility] [place], [place], DO

## 2024-03-04 NOTE — Patient Instructions (Addendum)
 Take 2mg  of ozempic a week (2 1mg  shots) to use up the 1mg  shots you have at home Then start 2mg  shots when you finish your 1mg  shots Continue insuin- only take the humalin if your sugars are high Keep taking the semglee (long acting insulin)

## 2024-03-04 NOTE — Progress Notes (Signed)
   03/04/2024  Patient ID: Kelsey Mcdowell, female   DOB: [birth year], 78 y.o.   MRN: [id number]  In basket request from PCP to assist with dose increase to Ozempic 2mg  through [employer] P[employer].  Prefilling order change form and faxing to Dr. Henriette Combs office to be reviewed, signed/dated, and faxed into [employer] P[employer].  Will check on status in 1 week.  [name], PharmD, DPLA

## 2024-03-04 NOTE — Assessment & Plan Note (Signed)
 Continue to follow with cardiology. Labs drawn today. Euvolemic today.

## 2024-03-04 NOTE — Progress Notes (Unsigned)
 There were no vitals taken for this visit.   Subjective:    Patient ID: Kelsey Mcdowell, female    DOB: [birth year], 78 y.o.   MRN: [id number]  HPI: Kelsey Mcdowell is a 78 y.o. female  Chief Complaint  Patient presents with   Type 2 Diabetes    Doing about the same. Checks twice daily.    DIABETES Hypoglycemic episodes:no Polydipsia/polyuria: no Visual disturbance: no Chest pain: no Paresthesias: no Glucose Monitoring: yes  Accucheck frequency: BID  Fasting glucose: 96  Evening: 250 Taking Insulin?: yes  Long acting insulin: semglee 20 units  Short acting insulin: humalin 7 units BID Blood Pressure Monitoring: rarely Retinal Examination: Up to Date Foot Exam: Up to Date Diabetic Education: Completed Pneumovax: Up to Date Influenza: Up to Date Aspirin: no   Relevant past medical, surgical, family and social history reviewed and updated as indicated. Interim medical history since our last visit reviewed. Allergies and medications reviewed and updated.  Review of Systems  Constitutional: Negative.   Respiratory: Negative.    Cardiovascular: Negative.   Musculoskeletal: Negative.   Neurological: Negative.   Psychiatric/Behavioral: Negative.      Per HPI unless specifically indicated above     Objective:    There were no vitals taken for this visit.  Wt Readings from Last 3 Encounters:  02/17/24 189 lb (85.7 kg)  01/13/24 191 lb (86.6 kg)  12/05/23 188 lb 3.2 oz (85.4 kg)    Physical Exam Vitals and nursing note reviewed.  Constitutional:      General: She is not in acute distress.    Appearance: Normal appearance. She is not ill-appearing, toxic-appearing or diaphoretic.  HENT:     Head: Normocephalic and atraumatic.     Right Ear: External ear normal.     Left Ear: External ear normal.     Nose: Nose normal.     Mouth/Throat:     Mouth: Mucous membranes are moist.     Pharynx: Oropharynx is clear.  Eyes:     General: No  scleral icterus.       Right eye: No discharge.        Left eye: No discharge.     Extraocular Movements: Extraocular movements intact.     Conjunctiva/sclera: Conjunctivae normal.     Pupils: Pupils are equal, round, and reactive to light.  Cardiovascular:     Rate and Rhythm: Normal rate and regular rhythm.     Pulses: Normal pulses.     Heart sounds: Normal heart sounds. No murmur heard.    No friction rub. No gallop.  Pulmonary:     Effort: Pulmonary effort is normal. No respiratory distress.     Breath sounds: Normal breath sounds. No stridor. No wheezing, rhonchi or rales.  Chest:     Chest wall: No tenderness.  Musculoskeletal:        General: Normal range of motion.     Cervical back: Normal range of motion and neck supple.  Skin:    General: Skin is warm and dry.     Capillary Refill: Capillary refill takes less than 2 seconds.     Coloration: Skin is not jaundiced or pale.     Findings: No bruising, erythema, lesion or rash.  Neurological:     General: No focal deficit present.     Mental Status: She is alert and oriented to person, place, and time. Mental status is at baseline.  Psychiatric:  Mood and Affect: Mood normal.        Behavior: Behavior normal.        Thought Content: Thought content normal.        Judgment: Judgment normal.     Results for orders placed or performed in visit on 03/04/24  Bayer DCA Hb A1c Waived   Collection Time: 03/04/24 10:27 AM  Result Value Ref Range   HB A1C (BAYER DCA - WAIVED) 7.7 (H) 4.8 - 5.6 %      Assessment & Plan:   Problem List Items Addressed This Visit       Cardiovascular and Mediastinum   Type 2 diabetes mellitus with cardiac complication (HCC) - Primary   A1c elevated at 7.7. Will increase her ozempic to 2mg  and continue sliding scale of humalin. Continue semglee. Recheck 1 month. Call with any concerns.       Relevant Medications   Semaglutide, 2 MG/DOSE, 8 MG/3ML SOPN   Semaglutide, 1 MG/DOSE, 4  MG/3ML SOPN   Other Relevant Orders   Bayer DCA Hb A1c Waived (Completed)   Basic metabolic panel   Chronic diastolic heart failure (HCC)   Continue to follow with cardiology. Labs drawn today. Euvolemic today.       Relevant Orders   Basic metabolic panel     Follow up plan: Return in about 4 weeks (around 04/01/2024).

## 2024-03-05 ENCOUNTER — Encounter: Payer: Self-pay | Admitting: Family Medicine

## 2024-03-05 LAB — BASIC METABOLIC PANEL
BUN/Creatinine Ratio: 12 (ref 12–28)
BUN: 18 mg/dL (ref 8–27)
CO2: 24 mmol/L (ref 20–29)
Calcium: 9.4 mg/dL (ref 8.7–10.3)
Chloride: 100 mmol/L (ref 96–106)
Creatinine, Ser: 1.56 mg/dL — ABNORMAL HIGH (ref 0.57–1.00)
Glucose: 147 mg/dL — ABNORMAL HIGH (ref 70–99)
Potassium: 4.4 mmol/L (ref 3.5–5.2)
Sodium: 142 mmol/L (ref 134–144)
eGFR: 34 mL/min/{1.73_m2} — ABNORMAL LOW (ref >59)

## 2024-03-11 ENCOUNTER — Other Ambulatory Visit: Payer: Self-pay

## 2024-03-11 ENCOUNTER — Telehealth: Payer: Self-pay | Admitting: Family

## 2024-03-11 MED ORDER — SPIRONOLACTONE 25 MG PO TABS
25.0000 mg | ORAL_TABLET | Freq: Every day | ORAL | 5 refills | Status: AC
Start: 2024-03-11 — End: ?

## 2024-03-11 NOTE — Telephone Encounter (Signed)
 Attempted to call pt to notify refills was sent in on Spironlactone by Suanne Marker, RN. However, pt should still has some refills left from the previous prescription.

## 2024-03-11 NOTE — Telephone Encounter (Signed)
 Spironolactone refill sent per pt request

## 2024-03-12 ENCOUNTER — Telehealth: Payer: Self-pay

## 2024-03-12 ENCOUNTER — Encounter: Payer: Self-pay | Admitting: Family Medicine

## 2024-03-12 NOTE — Progress Notes (Signed)
   03/12/2024  Patient ID: [name], female   DOB: 1946/05/08, 78 y.o.   MRN: [id number]  Contacted [employer] to check on processing of dose increase to Ozempic 2mg  weekly.  Company has not yet received the order change form, so I am following up with office to see if I need to refax the form to be signed and send into [employer].  [name], PharmD, DPLA

## 2024-03-15 ENCOUNTER — Telehealth: Payer: Self-pay | Admitting: Licensed Clinical Social Worker

## 2024-03-15 ENCOUNTER — Ambulatory Visit

## 2024-03-15 NOTE — Telephone Encounter (Addendum)
 CSW contacted patient to inform that [facility] Guide is out of the office unexpectedly and appointment will be cancelled for today. Care Guide will contact patient to reschedule. Lasandra Beech, LCSW, CCSW-MCS [phone]

## 2024-03-16 ENCOUNTER — Telehealth: Payer: Self-pay

## 2024-03-16 DIAGNOSIS — Z0000 Encounter for general adult medical examination without abnormal findings: Secondary | ICD-10-CM

## 2024-03-16 NOTE — Telephone Encounter (Signed)
 Called patient to reschedule health coaching appointment due to being out of office on 03/15/24. Left patient a message to return call to reschedule.   [name], MS, ERHD, NBC-HWC  Care Guide [facility] Heart & Vascular Care Navigation Telephone: [phone] Email: [email or web address]

## 2024-03-17 ENCOUNTER — Telehealth (HOSPITAL_BASED_OUTPATIENT_CLINIC_OR_DEPARTMENT_OTHER): Payer: Self-pay

## 2024-03-17 ENCOUNTER — Telehealth: Payer: Self-pay

## 2024-03-17 DIAGNOSIS — L821 Other seborrheic keratosis: Secondary | ICD-10-CM | POA: Diagnosis not present

## 2024-03-17 DIAGNOSIS — L400 Psoriasis vulgaris: Secondary | ICD-10-CM | POA: Diagnosis not present

## 2024-03-17 DIAGNOSIS — L570 Actinic keratosis: Secondary | ICD-10-CM | POA: Diagnosis not present

## 2024-03-17 DIAGNOSIS — Z79899 Other long term (current) drug therapy: Secondary | ICD-10-CM | POA: Diagnosis not present

## 2024-03-17 NOTE — Telephone Encounter (Signed)
 Patient called to reschedule health coaching appointment. Patient has been scheduled for 4/2 at 2:15pm for a telephonic appointment.   [name], MS, ERHD, NBC-HWC  Care Guide [facility] Heart & Vascular Care Navigation Telephone: [phone] Email: [email or web address]

## 2024-03-17 NOTE — Progress Notes (Signed)
   03/17/2024  Patient ID: [name], female   DOB: Dec 29, 1945, 78 y.o.   MRN: [id number]  Contacted [employer] P[employer], and dose increase for Ozempic 2mg  weekly has been approved.  Company states medication should arrive in 10-14 business days (around middle of April).  [name], PharmD, DPLA

## 2024-03-23 ENCOUNTER — Telehealth: Payer: Self-pay | Admitting: Family

## 2024-03-23 DIAGNOSIS — E113313 Type 2 diabetes mellitus with moderate nonproliferative diabetic retinopathy with macular edema, bilateral: Secondary | ICD-10-CM | POA: Diagnosis not present

## 2024-03-23 DIAGNOSIS — J449 Chronic obstructive pulmonary disease, unspecified: Secondary | ICD-10-CM | POA: Diagnosis not present

## 2024-03-23 NOTE — Telephone Encounter (Incomplete)
 Called to confirm/remind patient of their appointment at [facility] on 03/24/24***.   Appointment:   [] Confirmed  [] Left mess   [x] No answer/No voice mail  [] Phone not in service  Patient reminded to bring all medications and/or complete list.  Confirmed patient has transportation. Gave directions, instructed to utilize valet parking.

## 2024-03-24 ENCOUNTER — Encounter: Payer: Medicare Other | Admitting: Family

## 2024-03-24 ENCOUNTER — Ambulatory Visit: Attending: Cardiovascular Disease

## 2024-03-24 ENCOUNTER — Telehealth: Payer: Self-pay | Admitting: Family

## 2024-03-24 DIAGNOSIS — Z0000 Encounter for general adult medical examination without abnormal findings: Secondary | ICD-10-CM

## 2024-03-24 NOTE — Telephone Encounter (Signed)
 Patient did not show for her [facility] appointment on 03/24/24.

## 2024-03-24 NOTE — Progress Notes (Unsigned)
 Appointment Outcome: Completed, Session #: 5                        Start time: 2:16pm   End time: 2:40pm   Total Mins: 24 minutes  AGREEMENTS SECTION   Overall Goal(s): Quit smoking with the aid of nicotine replacement therapy and managing stress to refrain from relapsing.                                       Agreement/Action Steps:  Smoking cessation (Quit date - TBD) Wear nicotine patches as prescribed Refrain from smoking after beginning use of nicotine patches Drink water when the nicotine cravings start Engage in chair exercise for 5 minutes when nicotine cravings start     Stress management Identify new baking/candy recipes for book Continue to bake and make candy as a hobby    Progress Notes:  Patient reported that she smoked 2 cigarettes on 3/23 while visiting a friend asked the patient to smoke with her. Patient stated that she did not remove the patch as the time, and became afraid of what smoking would do to her, or that she would get sick. Patient shared that at first, she was able to resist by saying no, but the more she was around it, the more she began to think she wanted it.  Patient has been able to remain smoke free prior to and after that day. Patient mentioned that she has made up her mind not to smoke again moving forward. Patient shared that at home, staying busy with baking and making candy helps distract her from thinking about smoking. Patient shared that frequently drinking water keeps her hands busy as well and is a strategy that helps with not wanting to smoke because water fills her up.  Patient shared that she has 2 weeks left of the 14 mg nicotine patches. Patient mentioned that she is changing her patch every three days. Patient wonders if she will need the 7 mg patches after finishing the remaining nicotine patches.    Coaching Outcomes: Patient determined that being around another smoker for the first time was a trigger and that she will set  boundaries with her friend around smoking to maintain smoking cessation. Patient plans to take a water bottle with her to drink to help manage any urge to smoke when visiting her friend. Patient stated that talking herself through a moment when she has an urge to smoke and reflecting on her goal and the consequence of having to start over if she smokes will be additional strategies to keep from smoking.   Discussed with patient the importance of not smoking while wearing the nicotine patch nor removing patch to smoke to maintain smoking cessation. Discussed with patient the patches were prescribed to be changed daily. Patient expressed that she was not aware of having to change them as often and concluded that could have contributed to the urge to smoke at her friend's house as well.   Spent time confirming with patient her upcoming appointment dates and times to ensure that she had them written down correctly.   Attempted: Partial - patient is not wearing nicotine patches as prescribed by changing daily and did not refrain from smoking on one day.    Referrals: Toma Copier, FNP a message regarding patient's concern of having to continue with the 7 mg nicotine patches  if she feels that they are not necessary after completing the 14 mg patches for further follow up.  Resources: N/A

## 2024-03-24 NOTE — Progress Notes (Deleted)
 Advanced Heart Failure Clinic Note    PCP: Olevia Perches, DO (last seen 12/24) Primary Cardiologist: Leanora Ivanoff PA (last seen 01/25)  Chief Complaint: shortness of breath   HPI:  Kelsey Mcdowell is a 78 yo with a PMHx of COPD, acute on chronic systolic CHF, DM II, tobacco abuse, obesity, and HTN.  Admitted 04/24/23 due to several days of diarrhea, decreased PO intake and worsening weakness. Found to have oliguric AKI. Held furosemide, aldactone, entresto, hydrochlorothiazide, dapagliflozin during hospital stay.   Echo 01/28/22: EF 40-45% LV with mildly decreased function and global hypokinesis with mild LVH.  Echo 08/20/23: EF 60-65% with mild LVH, Grade I DD  She presents today for a HF follow-up visit with a chief complaint of minimal shortness of breath. Has continued fatigue and pedal edema along with this. Denies chest pain, cough, palpitations, abdominal distention, pedal edema, dizziness or weight gain. Has been eating out more often lately.    No oxygen for ~ 2 months due to change in insurance company.   No smoking for last 2 weeks. Continues to wear nicotine patch.    ROS: All systems negative except as listed in HPI, PMH and Problem List.  SH:  Social History   Socioeconomic History   Marital status: Widowed    Spouse name: Not on file   Number of children: Not on file   Years of education: Not on file   Highest education level: Not on file  Occupational History   Not on file  Tobacco Use   Smoking status: Former    Current packs/day: 0.00    Types: Cigarettes    Quit date: 02/01/2024    Years since quitting: 0.1   Smokeless tobacco: Never  Vaping Use   Vaping status: Never Used  Substance and Sexual Activity   Alcohol use: Yes    Comment: occasional   Drug use: No   Sexual activity: Not Currently  Other Topics Concern   Not on file  Social History Narrative   Not on file   Social Drivers of Health   Financial Resource Strain: Low Risk  (01/28/2024)    Overall Financial Resource Strain (CARDIA)    Difficulty of Paying Living Expenses: Not hard at all  Food Insecurity: No Food Insecurity (01/28/2024)   Hunger Vital Sign    Worried About Running Out of Food in the Last Year: Never true    Ran Out of Food in the Last Year: Never true  Transportation Needs: No Transportation Needs (01/28/2024)   PRAPARE - Administrator, Civil Service (Medical): No    Lack of Transportation (Non-Medical): No  Physical Activity: Not on file  Stress: Stress Concern Present (01/28/2024)   [employer] of Occupational Health - Occupational Stress Questionnaire    Feeling of Stress : To some extent  Social Connections: Not on file  Intimate Partner Violence: Not on file    FH:  Family History  Problem Relation Age of Onset   Cancer Mother    Hypertension Mother    Diabetes Mother    Heart disease Father    Diabetes Father    Cancer Sister    Breast cancer Sister 67   Heart disease Brother     Past Medical History:  Diagnosis Date   Arthritis    CHF (congestive heart failure) (HCC)    COPD (chronic obstructive pulmonary disease) (HCC)    Diabetes mellitus without complication (HCC)    GERD (gastroesophageal reflux disease)  Hyperlipemia    Hypertension    Myocardial infarction (HCC)    light heart attack ?when   Neuropathy    Sleep apnea     Current Outpatient Medications  Medication Sig Dispense Refill   acetaminophen (TYLENOL) 500 MG tablet Take 500 mg by mouth every 6 (six) hours as needed (back pain).     Blood Glucose Monitoring Suppl (ONE TOUCH ULTRA 2) w/Device KIT 1 each by Does not apply route daily at 2 PM. 1 kit 0   calcitRIOL (ROCALTROL) 0.25 MCG capsule Take 0.25 mcg by mouth daily.     cholecalciferol (VITAMIN D3) 25 MCG (1000 UNIT) tablet Take 1,000 Units by mouth daily.     dapagliflozin propanediol (FARXIGA) 10 MG TABS tablet Take 1 tablet (10 mg total) by mouth daily before breakfast. 90 tablet 3    DULoxetine (CYMBALTA) 20 MG capsule Take 1 capsule (20 mg total) by mouth daily. 90 capsule 1   fluticasone (FLONASE) 50 MCG/ACT nasal spray Place 2 sprays into both nostrils daily. 16 g 12   gabapentin (NEURONTIN) 300 MG capsule Take 1 cap in the AM and 2 in the PM 270 capsule 1   glucose blood (ONETOUCH ULTRA) test strip 1 each by Other route daily at 2 PM. Use as instructed 100 each 3   Insulin Pen Needle 34G X 3.5 MM MISC 1 Dose by Does not apply route daily. 100 each 0   insulin regular (HUMULIN R) 100 units/mL injection Inject 0.1 mLs (10 Units total) into the skin 2 (two) times daily before a meal. Before breakfast and supper 10 mL 0   Lancets (ONETOUCH ULTRASOFT) lancets 1 each by Other route daily at 2 PM. Use as instructed 100 each 3   metoprolol succinate (TOPROL-XL) 25 MG 24 hr tablet Take 1 tablet (25 mg total) by mouth daily. 180 tablet 1   Multiple Vitamin (MULTIVITAMIN WITH MINERALS) TABS tablet Take 1 tablet by mouth daily.     nicotine (NICODERM CQ - DOSED IN MG/24 HOURS) 21 mg/24hr patch Place 1 patch (21 mg total) onto the skin daily. Please be sure to remove the old patch from your skin before applying the new one. 28 patch 3   nortriptyline (PAMELOR) 50 MG capsule Take 1 capsule (50 mg total) by mouth daily. 90 capsule 0   omeprazole (PRILOSEC) 20 MG capsule Take 1 capsule (20 mg total) by mouth daily. 90 capsule 3   ondansetron (ZOFRAN-ODT) 4 MG disintegrating tablet Take 1 tablet (4 mg total) by mouth every 8 (eight) hours as needed for nausea or vomiting. 60 tablet 2   ONETOUCH VERIO test strip 1 each by Other route 3 (three) times daily. 100 each 12   rosuvastatin (CRESTOR) 10 MG tablet Take 1 tablet (10 mg total) by mouth at bedtime. 90 tablet 1   sacubitril-valsartan (ENTRESTO) 24-26 MG Take 1 tablet by mouth 2 (two) times daily. 180 tablet 3   Secukinumab (COSENTYX [place]) Inject into the skin every 30 (thirty) days. Last date give was the week of 11/25/22. Pt unsure of exact  date.     Semaglutide, 1 MG/DOSE, 4 MG/3ML SOPN Inject 1 mg as directed once a week.     Semaglutide, 2 MG/DOSE, 8 MG/3ML SOPN Inject 2 mg as directed once a week. 3 mL 1   SEMGLEE, YFGN, 100 UNIT/ML Pen Inject 20 Units into the skin at bedtime.     spironolactone (ALDACTONE) 25 MG tablet Take 1 tablet (25 mg total) by mouth  daily. 30 tablet 5   traZODone (DESYREL) 50 MG tablet Take 0.5-1 tablets (25-50 mg total) by mouth at bedtime as needed for sleep. 30 tablet 3   triamcinolone ointment (KENALOG) 0.5 % Apply 1 Application topically daily as needed.     vitamin B-12 (CYANOCOBALAMIN) 1000 MCG tablet Take 1,000 mcg by mouth daily.     No current facility-administered medications for this visit.   There were no vitals filed for this visit.  Wt Readings from Last 3 Encounters:  03/04/24 187 lb (84.8 kg)  02/17/24 189 lb (85.7 kg)  01/13/24 191 lb (86.6 kg)   Lab Results  Component Value Date   CREATININE 1.56 (H) 03/04/2024   CREATININE 1.38 (H) 10/14/2023   CREATININE 1.54 (H) 08/05/2023    PHYSICAL EXAM:  General: Well appearing. No resp difficulty HEENT: normal Neck: supple, no JVD Cor: Regular rhythm, rate. No rubs, gallops or murmurs Lungs: clear Abdomen: soft, nontender, nondistended. Extremities: no cyanosis, clubbing, rash, edema Neuro: alert & oriented X 3. Moves all 4 extremities w/o difficulty. Affect pleasant   ECG: not done   ASSESSMENT & PLAN:  NICM with preserved ejection fraction- - likely due to to HTN/ COPD - NYHA class II - euvolemic - weighing most days; reviewed the importance of weighing daily and to call for an overnight weight gain of > 2 pounds or a weekly weight gain of > 5 pounds - weight down 2 pounds from last visit here 1 month ago - echo 01/28/22: EF 40-45% LV with mildly decreased function and global hypokinesis with mild LVH - echo 08/20/23: EF 60-65% with mild LVH, Grade I DD (reviewed this with patient today) - continue farxiga 10mg   daily - continue metoprolol succinate 25mg  daily - continue entresto 24/26mg  BID - increased spironolactone to 25mg  daily  - BMET in 7-10 days & then, again at next visit - not adding salt to her food but has been eating out more often - elevate legs when sitting for long periods of time; she was unable to tolerate compression socks - saw cardiology Mellissa Kohut) 01/25 - encouraged as much activity as possible - BNP 01/27/22 721.9  Hypertension- - BP 142/71 - increasing spiro per above - saw PCP [name]) 12/24 - saw nephrology Cherylann Ratel) 02/25 - BMP 02/05/24 reviewed and showed sodium 140, potassium 4.4, creatinine 1.45 and GFR 37 - BMET in 7-10 days with spiro titration  COPD- - saw pulmonologist Meredeth Ide) 11/24 - currently doesn't have oxygen as her insurance company changed oxygen providers and she hasn't had oxygen for ~ 2 months;  - she was unable to commit to pulmonary rehab because of her living 1/2 hour away from [facility] and gas is too expensive  DM- - A1c 04/24/23 was 7.9%  - ozempic 1mg  weekly; managed by PCP - saw nephrology Cherylann Ratel) 02/25  5: Tobacco use- - using nicotine patch 21mg  daily; discussed decreasing this done at next visit - no smoking for last 2 weeks - participating with health coach and feels like it's been helpful   Return in 1 month, sooner if needed.   Delma Freeze, FNP 03/24/24

## 2024-03-26 ENCOUNTER — Other Ambulatory Visit: Payer: Self-pay | Admitting: Family

## 2024-03-26 MED ORDER — NICOTINE 7 MG/24HR TD PT24: 7.0000 mg | MEDICATED_PATCH | Freq: Every day | TRANSDERMAL | 3 refills | Status: AC

## 2024-03-29 ENCOUNTER — Other Ambulatory Visit: Payer: Self-pay | Admitting: Family Medicine

## 2024-03-30 ENCOUNTER — Other Ambulatory Visit: Payer: Self-pay | Admitting: Family Medicine

## 2024-03-30 NOTE — Telephone Encounter (Signed)
 Requested Prescriptions  Pending Prescriptions Disp Refills   HUMULIN R 100 UNIT/ML injection [Pharmacy Med Name: HumuLIN R 100 UNIT/ML Injection Solution] 10 mL 0    Sig: INJECT 10 UNITS SUBCUTANEOUSLY TWICE DAILY BEFORE BREAKFAST AND SUPPER     Endocrinology:  Diabetes - Insulins Passed - 03/30/2024 10:43 AM      Passed - HBA1C is between 0 and 7.9 and within 180 days    Hemoglobin A1C  Date Value Ref Range Status  04/24/2023 7.9  Final   HB A1C (BAYER DCA - WAIVED)  Date Value Ref Range Status  03/04/2024 7.7 (H) 4.8 - 5.6 % Final    Comment:             Prediabetes: 5.7 - 6.4          Diabetes: >6.4          Glycemic control for adults with diabetes: <7.0          Passed - Valid encounter within last 6 months    Recent Outpatient Visits           3 weeks ago Type 2 diabetes mellitus with cardiac complication [facility])   [facility] [place], Megan P, DO

## 2024-03-31 NOTE — Telephone Encounter (Signed)
 Requested medication (s) are due for refill today: yes  Requested medication (s) are on the active medication list: yes  Last refill:  01/29/24  Future visit scheduled: no  Notes to clinic:  Medication not assigned to a protocol, review manually. Lasted refilled by historical provider.     Requested Prescriptions  Pending Prescriptions Disp Refills   SEMGLEE, YFGN, 100 UNIT/ML Pen [Pharmacy Med Name: Semglee (yfgn) 100 UNIT/ML Subcutaneous Solution Pen-injector] 15 mL 0    Sig: INJECT 16 UNITS SUBCUTANEOUSLY ONCE DAILY     Off-Protocol Failed - 03/31/2024 11:39 AM      Failed - Medication not assigned to a protocol, review manually.      Passed - Valid encounter within last 12 months    Recent Outpatient Visits           3 weeks ago Type 2 diabetes mellitus with cardiac complication [facility])   [facility] [place], Megan P, DO

## 2024-03-31 NOTE — Telephone Encounter (Signed)
 Rx 12/05/23 #270 1RF- too soon Requested Prescriptions  Pending Prescriptions Disp Refills   gabapentin (NEURONTIN) 300 MG capsule [Pharmacy Med Name: Gabapentin 300 MG Oral Capsule] 270 capsule 0    Sig: TAKE 1 CAPSULE BY MOUTH IN THE MORNING AND 2 IN THE EVENING     Neurology: Anticonvulsants - gabapentin Failed - 03/31/2024  2:58 PM      Failed - Cr in normal range and within 360 days    Creatinine, Ser  Date Value Ref Range Status  03/04/2024 1.56 (H) 0.57 - 1.00 mg/dL Final   Creatinine, Urine  Date Value Ref Range Status  06/04/2023 43  Final         Passed - Completed PHQ-2 or PHQ-9 in the last 360 days      Passed - Valid encounter within last 12 months    Recent Outpatient Visits           3 weeks ago Type 2 diabetes mellitus with cardiac complication [facility])   [facility] [place], Megan P, DO

## 2024-03-31 NOTE — Telephone Encounter (Signed)
 Requested Prescriptions  Pending Prescriptions Disp Refills   traZODone (DESYREL) 50 MG tablet [Pharmacy Med Name: traZODone HCl 50 MG Oral Tablet] 90 tablet 0    Sig: TAKE 1/2 TO 1 (ONE-HALF TO ONE) TABLET BY MOUTH AT BEDTIME AS NEEDED FOR SLEEP     Psychiatry: Antidepressants - Serotonin Modulator Passed - 03/31/2024  9:40 AM      Passed - Completed PHQ-2 or PHQ-9 in the last 360 days      Passed - Valid encounter within last 6 months    Recent Outpatient Visits           3 weeks ago Type 2 diabetes mellitus with cardiac complication [facility])   [facility] [place], Megan P, DO

## 2024-04-03 ENCOUNTER — Other Ambulatory Visit: Payer: Self-pay | Admitting: Family Medicine

## 2024-04-05 ENCOUNTER — Other Ambulatory Visit: Payer: Self-pay

## 2024-04-05 ENCOUNTER — Other Ambulatory Visit: Payer: Self-pay | Admitting: Family Medicine

## 2024-04-05 NOTE — Telephone Encounter (Signed)
 RN called pt as she is requesting more insulin at this time. Per pt the insurance won't pay for more humulin at this time. Pt believes that she is using more than rx'd. Pt states that her sugars have been high at night, pt also admits to using it 3 times per day. Pt advised of current rx. Pt states that with her sugars being higher at night she needs more insulin. Pt is wondering if the doctor can write for more in a way that the insurance company will pay for more.  Copied from CRM [id number]. Topic: Clinical - Medical Advice >> Apr 05, 2024  2:32 PM Tiffany S wrote: Reason for CRM: Patient is out of Insulin patient stated the insurance wont pay for it until the 27th please follow up with patient wanted to know if the doctor will call her in some insulin to hold her off until the 27th   HUMULIN R 100 UNIT/ML injection

## 2024-04-05 NOTE — Telephone Encounter (Signed)
 Too soon for refill.  Requested Prescriptions  Pending Prescriptions Disp Refills   nortriptyline (PAMELOR) 50 MG capsule [Pharmacy Med Name: Nortriptyline HCl 50 MG Oral Capsule] 90 capsule 0    Sig: Take 1 capsule by mouth once daily     Psychiatry:  Antidepressants - Heterocyclics (TCAs) Passed - 04/05/2024  2:53 PM      Passed - Completed PHQ-2 or PHQ-9 in the last 360 days      Passed - Valid encounter within last 6 months    Recent Outpatient Visits           1 month ago Type 2 diabetes mellitus with cardiac complication (HCC)   [facility], Megan P, DO               SEMGLEE, YFGN, 100 UNIT/ML Pen [Pharmacy Med Name: Semglee (yfgn) 100 UNIT/ML Subcutaneous Solution Pen-injector] 15 mL 0    Sig: INJECT 16 UNITS SUBCUTANEOUSLY ONCE DAILY     Off-Protocol Failed - 04/05/2024  2:53 PM      Failed - Medication not assigned to a protocol, review manually.      Passed - Valid encounter within last 12 months    Recent Outpatient Visits           1 month ago Type 2 diabetes mellitus with cardiac complication [facility])   [facility] [place], Megan P, DO

## 2024-04-06 ENCOUNTER — Telehealth: Payer: Self-pay | Admitting: Family Medicine

## 2024-04-06 ENCOUNTER — Other Ambulatory Visit: Payer: Self-pay | Admitting: Family Medicine

## 2024-04-06 NOTE — Telephone Encounter (Signed)
 This will need to be discussed at her appointment next week

## 2024-04-06 NOTE — Telephone Encounter (Addendum)
 Will need to be discussed with patient at her appointment next week. No refills/samples to be provided at this time.   Patient should be taking her medications only as prescribed.  Humulin- Sig: INJECT 10 UNITS SUBCUTANEOUSLY TWICE DAILY BEFORE BREAKFAST AND SUPPER. Please confirm what dose of this she is taking.  Nortriptyline- Sig: Take 1 capsule (50 mg total) by mouth daily.   Semaglutide, 2 MG/DOSE- Sig: Inject 2 mg as directed once a week. Does she have this?   Semaglutide, 1 MG/DOSE was only given as sample, so when she runs out she should not be taking that dose anymore. She should have picked up the 2 mg dose by now. Please confirm what dose of this she is taking.

## 2024-04-06 NOTE — Telephone Encounter (Signed)
 Prescription Request  04/06/2024  LOV: 03/04/2024  What is the name of the medication?  nortriptyline (PAMELOR) 50 MG capsule   Semaglutide, 1 MG/DOSE, 4 MG/3ML SOPN   Semaglutide, 2 MG/DOSE, 8 MG/3ML SOPN   HUMULIN R 100 UNIT/ML injection: Patient requesting samples of humulin. States she had to take more because her sugars have been running high and that pharmacy said it's to early to fill.   Have you contacted your pharmacy to request a refill? Yes   Which pharmacy would you like this sent to?   [facility] [address] [place] - [address] [place] [address] Phone: [phone] Fax: [phone]   Patient notified that their request is being sent to the clinical staff for review and that they should receive a response within 2 business days.   Please advise at Mobile [phone] (mobile)

## 2024-04-07 ENCOUNTER — Ambulatory Visit (HOSPITAL_BASED_OUTPATIENT_CLINIC_OR_DEPARTMENT_OTHER)

## 2024-04-07 NOTE — Telephone Encounter (Signed)
 Requested Prescriptions  Pending Prescriptions Disp Refills   nortriptyline (PAMELOR) 50 MG capsule [Pharmacy Med Name: Nortriptyline HCl 50 MG Oral Capsule] 90 capsule 0    Sig: Take 1 capsule by mouth once daily     Psychiatry:  Antidepressants - Heterocyclics (TCAs) Passed - 04/07/2024 12:14 PM      Passed - Completed PHQ-2 or PHQ-9 in the last 360 days      Passed - Valid encounter within last 6 months    Recent Outpatient Visits           1 month ago Type 2 diabetes mellitus with cardiac complication [facility])   [facility] [place], Megan P, DO

## 2024-04-08 ENCOUNTER — Telehealth (HOSPITAL_BASED_OUTPATIENT_CLINIC_OR_DEPARTMENT_OTHER): Payer: Self-pay | Admitting: Licensed Clinical Social Worker

## 2024-04-08 NOTE — Telephone Encounter (Signed)
 Copied from CRM [id number]. Topic: Clinical - Medication Question >> Apr 07, 2024  9:12 AM Kelsey Mcdowell wrote: Reason for CRM: Patient returning [name] Call. Patient states that she is out of Humulin-Sig, Nortriptyline-Sig, Semaglutide 2 MG, and Semaglutide 1 MG. Please contact patient at [phone].

## 2024-04-08 NOTE — Telephone Encounter (Signed)
 H&V Care Navigation CSW Progress Note  Clinical Social Worker contacted patient by phone to f/u on health coaching appt from 4/16. Was able to reach pt at [phone]. Shared we have had a change in staffing and are no longer able to offer health coaching. Pt shares she has been smoke free without the patches for 5 days, feels okay with managing her goals herself at this time. Is aware to contact Brian Campanile, NP with Heart Failure clinic with any additional questions or if any additional referrals needed. Pt has stayed busy with assisting family and friends by cooking and baking for community events. No additional questions/concerns at this time.  Patient is participating in a Managed Medicaid Plan:  No, UHC Medicare  SDOH Screenings   Food Insecurity: No Food Insecurity (01/28/2024)  Housing: Low Risk  (01/28/2024)  Transportation Needs: No Transportation Needs (01/28/2024)  Utilities: Not At Risk (01/28/2024)  Depression (PHQ2-9): Medium Risk (03/04/2024)  Financial Resource Strain: Low Risk  (01/28/2024)  Stress: Stress Concern Present (01/28/2024)  Tobacco Use: Medium Risk (03/05/2024)  Health Literacy: Adequate Health Literacy (01/28/2024)     Nathen Balder, MSW, LCSW Clinical Social Worker II [facility] Health Heart/Vascular Care Navigation  [phone]- work cell phone (preferred) [phone]- desk phone

## 2024-04-08 NOTE — Telephone Encounter (Signed)
 Discussed with patient. Appt to be changed to 04/12/2024 at 2:40 with Dr. Lincoln Renshaw. Hyperglycemia precautions reviewed with patient.

## 2024-04-12 ENCOUNTER — Encounter: Payer: Self-pay | Admitting: Family Medicine

## 2024-04-12 ENCOUNTER — Telehealth: Payer: Self-pay | Admitting: Family

## 2024-04-12 ENCOUNTER — Ambulatory Visit: Admitting: Family Medicine

## 2024-04-12 VITALS — BP 119/71 | HR 63 | Ht 60.0 in | Wt 188.5 lb

## 2024-04-12 DIAGNOSIS — E1159 Type 2 diabetes mellitus with other circulatory complications: Secondary | ICD-10-CM | POA: Diagnosis not present

## 2024-04-12 DIAGNOSIS — Z7985 Long-term (current) use of injectable non-insulin antidiabetic drugs: Secondary | ICD-10-CM

## 2024-04-12 MED ORDER — INSULIN REGULAR HUMAN 100 UNIT/ML IJ SOLN: 10.0000 [IU] | Freq: Three times a day (TID) | INTRAMUSCULAR | 0 refills | Status: DC

## 2024-04-12 MED ORDER — SEMGLEE (YFGN) 100 UNIT/ML ~~LOC~~ SOPN
25.0000 [IU] | PEN_INJECTOR | Freq: Every day | SUBCUTANEOUS | 2 refills | Status: DC
Start: 2024-04-12 — End: 2024-05-03

## 2024-04-12 NOTE — Progress Notes (Signed)
 Advanced Heart Failure Clinic Note    PCP: Terre Ferri, DO (last seen 04/25) Primary Cardiologist: Barton Like PA (last seen 01/25)  Chief Complaint: shortness of breath   HPI:  Kelsey Mcdowell is a 78 yo with a PMHx of COPD, acute on chronic systolic CHF, DM II, tobacco abuse, obesity, and HTN.  Admitted to [facility] 01/27/2022, treated for acute hypoxic hypercapnic respiratory failure requiring BiPAP, treated with IV Lasix  and IV Solu-Medrol . She was discharged on supplemental oxygen  and Lasix  20 mg. Beta-blocker was deferred due to bronchospasm initially. She was discharged on lisinopril. She was also noted to be anemic with hemoglobin 8.9 and received IV Venofer . Chest x-ray showed cardiomegaly with bilateral airspace disease, likely edema/CHF with no effusions. 2D echocardiogram on 01/28/2022 revealed mildly reduced left ventricular function with LVEF 40 to 45%. There were no previous echocardiograms for comparison.   Lexiscan Myoview was performed 03/07/2022. Gated scintigraphy revealed LV ejection fraction of 33%. SPECT analysis revealed a mild anterior scar mixed with minimal ischemia.   Admitted 04/24/23 due to several days of diarrhea, decreased PO intake and worsening weakness. Found to have oliguric AKI. Held furosemide , aldactone , entresto , hydrochlorothiazide, dapagliflozin  during hospital stay.   Echo 08/20/23: EF 60-65% with mild LVH, Grade I DD  Seen in the HF Clinic 02/17/24 and had spironolactone  increased to 25mg  daily.   She presents today for a HF follow-up visit with a chief complaint of shortness of breath when walking long distances. Has associated fatigue, pedal edema & peripheral neuropathy along with this. Denies chest pain, cough, palpitations, abdominal distention, dizziness or difficulty sleeping. Has not smoked anything in the last month. Is finishing up the 14mg  nicoderm patch and then will transition down to the 7mg  patch. Has zip up compression socks but hasn't  started wearing them yet.   Took atorvastatin in the past and it made her neuropathy worse. Has rosuvastatin  at home but hasn't started taking it yet because she was concerned about it making her neuropathy worse.    ROS: All systems negative except as listed in HPI, PMH and Problem List.  SH:  Social History   Socioeconomic History   Marital status: Widowed    Spouse name: Not on file   Number of children: Not on file   Years of education: Not on file   Highest education level: Not on file  Occupational History   Not on file  Tobacco Use   Smoking status: Former    Current packs/day: 0.00    Types: Cigarettes    Quit date: 02/01/2024    Years since quitting: 0.1   Smokeless tobacco: Never  Vaping Use   Vaping status: Never Used  Substance and Sexual Activity   Alcohol  use: Yes    Comment: occasional   Drug use: No   Sexual activity: Not Currently  Other Topics Concern   Not on file  Social History Narrative   Not on file   Social Drivers of Health   Financial Resource Strain: Low Risk  (01/28/2024)   Overall Financial Resource Strain (CARDIA)    Difficulty of Paying Living Expenses: Not hard at all  Food Insecurity: No Food Insecurity (01/28/2024)   Hunger Vital Sign    Worried About Running Out of Food in the Last Year: Never true    Ran Out of Food in the Last Year: Never true  Transportation Needs: No Transportation Needs (01/28/2024)   PRAPARE - Administrator, Civil Service (Medical): No  Lack of Transportation (Non-Medical): No  Physical Activity: Not on file  Stress: Stress Concern Present (01/28/2024)   [employer] of Occupational Health - Occupational Stress Questionnaire    Feeling of Stress : To some extent  Social Connections: Not on file  Intimate Partner Violence: Not on file    FH:  Family History  Problem Relation Age of Onset   Cancer Mother    Hypertension Mother    Diabetes Mother    Heart disease Father    Diabetes Father     Cancer Sister    Breast cancer Sister 82   Heart disease Brother     Past Medical History:  Diagnosis Date   Arthritis    CHF (congestive heart failure) (HCC)    COPD (chronic obstructive pulmonary disease) (HCC)    Diabetes mellitus without complication (HCC)    GERD (gastroesophageal reflux disease)    Hyperlipemia    Hypertension    Myocardial infarction (HCC)    light heart attack ?when   Neuropathy    Sleep apnea     Current Outpatient Medications  Medication Sig Dispense Refill   acetaminophen  (TYLENOL ) 500 MG tablet Take 500 mg by mouth every 6 (six) hours as needed (back pain).     Blood Glucose Monitoring Suppl (ONE TOUCH ULTRA 2) w/Device KIT 1 each by Does not apply route daily at 2 PM. 1 kit 0   calcitRIOL (ROCALTROL) 0.25 MCG capsule Take 0.25 mcg by mouth daily.     cholecalciferol (VITAMIN D3) 25 MCG (1000 UNIT) tablet Take 1,000 Units by mouth daily.     dapagliflozin  propanediol (FARXIGA ) 10 MG TABS tablet Take 1 tablet (10 mg total) by mouth daily before breakfast. 90 tablet 3   DULoxetine  (CYMBALTA ) 20 MG capsule Take 1 capsule (20 mg total) by mouth daily. 90 capsule 1   fluticasone  (FLONASE ) 50 MCG/ACT nasal spray Place 2 sprays into both nostrils daily. 16 g 12   gabapentin  (NEURONTIN ) 300 MG capsule Take 1 cap in the AM and 2 in the PM 270 capsule 1   glucose blood (ONETOUCH ULTRA) test strip 1 each by Other route daily at 2 PM. Use as instructed 100 each 3   Insulin  Pen Needle 34G X 3.5 MM MISC 1 Dose by Does not apply route daily. 100 each 0   insulin  regular (HUMULIN R ) 100 units/mL injection Inject 0.1-0.15 mLs (10-15 Units total) into the skin 3 (three) times daily before meals. 13.5 mL 0   Lancets (ONETOUCH ULTRASOFT) lancets 1 each by Other route daily at 2 PM. Use as instructed 100 each 3   metoprolol  succinate (TOPROL -XL) 25 MG 24 hr tablet Take 1 tablet (25 mg total) by mouth daily. 180 tablet 1   Multiple Vitamin (MULTIVITAMIN WITH MINERALS)  TABS tablet Take 1 tablet by mouth daily.     nicotine  (NICODERM CQ  - DOSED IN MG/24 HR) 7 mg/24hr patch Place 1 patch (7 mg total) onto the skin daily. (Patient not taking: Reported on 04/12/2024) 28 patch 3   nortriptyline  (PAMELOR ) 50 MG capsule Take 1 capsule by mouth once daily 90 capsule 0   omeprazole  (PRILOSEC) 20 MG capsule Take 1 capsule (20 mg total) by mouth daily. 90 capsule 3   ondansetron  (ZOFRAN -ODT) 4 MG disintegrating tablet Take 1 tablet (4 mg total) by mouth every 8 (eight) hours as needed for nausea or vomiting. (Patient not taking: Reported on 04/12/2024) 60 tablet 2   ONETOUCH VERIO test strip 1 each by Other  route 3 (three) times daily. 100 each 12   rosuvastatin  (CRESTOR ) 10 MG tablet Take 1 tablet (10 mg total) by mouth at bedtime. 90 tablet 1   sacubitril -valsartan  (ENTRESTO ) 24-26 MG Take 1 tablet by mouth 2 (two) times daily. 180 tablet 3   Secukinumab (COSENTYX ) Inject into the skin every 30 (thirty) days. Last date give was the week of 11/25/22. Pt unsure of exact date.     Semaglutide , 2 MG/DOSE, 8 MG/3ML SOPN Inject 2 mg as directed once a week. 3 mL 1   SEMGLEE , YFGN, 100 UNIT/ML Pen Inject 25 Units into the skin at bedtime. 7.5 mL 2   spironolactone  (ALDACTONE ) 25 MG tablet Take 1 tablet (25 mg total) by mouth daily. 30 tablet 5   traZODone  (DESYREL ) 50 MG tablet TAKE 1/2 TO 1 (ONE-HALF TO ONE) TABLET BY MOUTH AT BEDTIME AS NEEDED FOR SLEEP 90 tablet 0   triamcinolone  ointment (KENALOG ) 0.5 % Apply 1 Application topically daily as needed.     vitamin B-12 (CYANOCOBALAMIN) 1000 MCG tablet Take 1,000 mcg by mouth daily.     No current facility-administered medications for this visit.   Vitals:   04/13/24 1315  BP: 136/68  Pulse: 82  SpO2: 95%  Weight: 190 lb 2 oz (86.2 kg)   Wt Readings from Last 3 Encounters:  04/13/24 190 lb 2 oz (86.2 kg)  04/12/24 188 lb 8 oz (85.5 kg)  03/04/24 187 lb (84.8 kg)   Lab Results  Component Value Date   CREATININE 1.56  (H) 03/04/2024   CREATININE 1.38 (H) 10/14/2023   CREATININE 1.54 (H) 08/05/2023    PHYSICAL EXAM:  General: Well appearing. No resp difficulty HEENT: normal Neck: supple, no JVD Cor: Regular rhythm, rate. No rubs, gallops or murmurs Lungs: clear Abdomen: soft, nontender, nondistended. Extremities: no cyanosis, clubbing, rash, 1+ pitting edema bilateral lower legs Neuro: alert & oriented X 3. Moves all 4 extremities w/o difficulty. Affect pleasant   ECG: not done   ASSESSMENT & PLAN:  NICM with preserved ejection fraction- - likely due to to HTN/ COPD - NYHA class II - euvolemic - weighing most days; reviewed the importance of weighing daily and to call for an overnight weight gain of > 2 pounds or a weekly weight gain of > 5 pounds - weight stable from last visit here 2 months ago - echo 01/28/22: EF 40-45% LV with mildly decreased function and global hypokinesis with mild LVH - echo 08/20/23: EF 60-65% with mild LVH, Grade I DD  - continue farxiga  10mg  daily - continue metoprolol  succinate 25mg  daily - continue entresto  24/26mg  BID - continue spironolactone  25mg  daily  - not adding salt to her food but has been eating out more often - has zip up compression socks that she's willing to try; explained to wear them during the day with removal at bedtime - saw cardiology Anthonette Bastos) 01/25 - encouraged as much activity as possible - BNP 01/27/22 721.9  Hypertension- - BP 136/68 - saw PCP Lincoln Renshaw) 04/25 - saw nephrology Rhesa Celeste) 02/25 - BMP 03/04/24 reviewed: sodium 142, potassium 4.4, creatinine 1.56 and GFR 34 - BMET today  COPD- - saw pulmonologist Jamal Mays) 11/24 - she was unable to commit to pulmonary rehab because of her living 1/2 hour away from [facility] and gas is too expensive  DM- - A1c 03/04/24 was 7.7%  - ozempic  2mg  weekly; managed by PCP - saw nephrology Rhesa Celeste) 02/25  5: Tobacco use- - using nicotine  patch 14mg  daily; she  is finishing the 14 mg patches and then  is transitioning down to the 7mg  patch - no smoking for last 1 month  6: Hyperlipidemia- - lipid panel today to see what it looks like - she has rosuvastatin  10mg  at home and is willing to try it if needed; will call patient after we get results back   Return in 6 months, sooner if needed.   Charlette Console, FNP 04/12/24

## 2024-04-12 NOTE — Telephone Encounter (Signed)
 Called to confirm/remind patient of their appointment at the [facility] Failure Clinic on 04/13/24.   Appointment:   [] Confirmed  [x] Left mess   [] No answer/No voice mail  [] VM Full/unable to leave message  [] Phone not in service  Patient reminded to bring all medications and/or complete list.  Confirmed patient has transportation. Gave directions, instructed to utilize valet parking.

## 2024-04-12 NOTE — Progress Notes (Signed)
 BP 119/71 (BP Location: Left Arm, Patient Position: Sitting, Cuff Size: Normal)   Pulse 63   Ht 5' (1.524 m)   Wt 188 lb 8 oz (85.5 kg)   SpO2 96%   BMI 36.81 kg/m    Subjective:    Patient ID: Kelsey Mcdowell, female    DOB: 1946-05-01, 78 y.o.   MRN: [id number]  HPI: Kelsey Mcdowell is a 78 y.o. female  Chief Complaint  Patient presents with   Diabetes   DIABETES- notes that when she first started the 2mg  she was constipated for about a week, now having diarrhea. She seems like it's slowing down and she's starting to have it let up. Feeling OK on the 2mg  of ozempic , she notes that she has not been cheating much, but her sugars have been really high.  Hypoglycemic episodes:no Polydipsia/polyuria: no Visual disturbance: no Chest pain: no Paresthesias: no Glucose Monitoring: yes  Accucheck frequency: BID  Fasting glucose: 183  Post prandial: 340, 260 Taking Insulin ?: yes  Long acting insulin : 20 units daily of semglee   Short acting insulin : Ran out because she was taking extra of her humalog taking 10-12 units.  Blood Pressure Monitoring: rarely Retinal Examination: Up to Date Foot Exam: Up to Date Diabetic Education: Completed Pneumovax: Up to Date Influenza: Up to Date Aspirin: no  Relevant past medical, surgical, family and social history reviewed and updated as indicated. Interim medical history since our last visit reviewed. Allergies and medications reviewed and updated.  Review of Systems  Constitutional: Negative.   Respiratory: Negative.    Cardiovascular: Negative.   Musculoskeletal: Negative.   Neurological: Negative.   Psychiatric/Behavioral: Negative.      Per HPI unless specifically indicated above     Objective:    BP 119/71 (BP Location: Left Arm, Patient Position: Sitting, Cuff Size: Normal)   Pulse 63   Ht 5' (1.524 m)   Wt 188 lb 8 oz (85.5 kg)   SpO2 96%   BMI 36.81 kg/m   Wt Readings from Last 3 Encounters:   04/12/24 188 lb 8 oz (85.5 kg)  03/04/24 187 lb (84.8 kg)  02/17/24 189 lb (85.7 kg)    Physical Exam Vitals and nursing note reviewed.  Constitutional:      General: She is not in acute distress.    Appearance: Normal appearance. She is obese. She is not ill-appearing, toxic-appearing or diaphoretic.  HENT:     Head: Normocephalic and atraumatic.     Right Ear: External ear normal.     Left Ear: External ear normal.     Nose: Nose normal.     Mouth/Throat:     Mouth: Mucous membranes are moist.     Pharynx: Oropharynx is clear.  Eyes:     General: No scleral icterus.       Right eye: No discharge.        Left eye: No discharge.     Extraocular Movements: Extraocular movements intact.     Conjunctiva/sclera: Conjunctivae normal.     Pupils: Pupils are equal, round, and reactive to light.  Cardiovascular:     Rate and Rhythm: Normal rate and regular rhythm.     Pulses: Normal pulses.     Heart sounds: Normal heart sounds. No murmur heard.    No friction rub. No gallop.  Pulmonary:     Effort: Pulmonary effort is normal. No respiratory distress.     Breath sounds: Normal breath sounds. No stridor. No wheezing, rhonchi or  rales.  Chest:     Chest wall: No tenderness.  Musculoskeletal:        General: Normal range of motion.     Cervical back: Normal range of motion and neck supple.  Skin:    General: Skin is warm and dry.     Capillary Refill: Capillary refill takes less than 2 seconds.     Coloration: Skin is not jaundiced or pale.     Findings: No bruising, erythema, lesion or rash.  Neurological:     General: No focal deficit present.     Mental Status: She is alert and oriented to person, place, and time. Mental status is at baseline.  Psychiatric:        Mood and Affect: Mood normal.        Behavior: Behavior normal.        Thought Content: Thought content normal.        Judgment: Judgment normal.     Results for orders placed or performed in visit on 03/04/24   Bayer DCA Hb A1c Waived   Collection Time: 03/04/24 10:27 AM  Result Value Ref Range   HB A1C (BAYER DCA - WAIVED) 7.7 (H) 4.8 - 5.6 %  Basic metabolic panel   Collection Time: 03/04/24 12:06 PM  Result Value Ref Range   Glucose 147 (H) 70 - 99 mg/dL   BUN 18 8 - 27 mg/dL   Creatinine, Ser 9.56 (H) 0.57 - 1.00 mg/dL   eGFR 34 (L) >21 HY/QMV/7.84   BUN/Creatinine Ratio 12 12 - 28   Sodium 142 134 - 144 mmol/L   Potassium 4.4 3.5 - 5.2 mmol/L   Chloride 100 96 - 106 mmol/L   CO2 24 20 - 29 mmol/L   Calcium  9.4 8.7 - 10.3 mg/dL      Assessment & Plan:   Problem List Items Addressed This Visit       Cardiovascular and Mediastinum   Type 2 diabetes mellitus with cardiac complication (HCC) - Primary   Sugars running high. Will increase her semglee  to 25 units daily and her humalog to 10-15 units TID. Continue ozempic . Recheck on sugars virtually in about 3 weeks. Call with any concerns.       Relevant Medications   SEMGLEE , YFGN, 100 UNIT/ML Pen   insulin  regular (HUMULIN R ) 100 units/mL injection     Follow up plan: Return in about 3 weeks (around 05/03/2024) for virtual OK ok to double book if needed.

## 2024-04-12 NOTE — Assessment & Plan Note (Signed)
 Sugars running high. Will increase her semglee  to 25 units daily and her humalog to 10-15 units TID. Continue ozempic . Recheck on sugars virtually in about 3 weeks. Call with any concerns.

## 2024-04-13 ENCOUNTER — Ambulatory Visit: Payer: Medicare Other | Attending: Family | Admitting: Family

## 2024-04-13 ENCOUNTER — Encounter: Payer: Self-pay | Admitting: Family

## 2024-04-13 ENCOUNTER — Telehealth: Payer: Self-pay

## 2024-04-13 VITALS — BP 136/68 | HR 82 | Wt 190.1 lb

## 2024-04-13 DIAGNOSIS — I110 Hypertensive heart disease with heart failure: Secondary | ICD-10-CM | POA: Diagnosis not present

## 2024-04-13 DIAGNOSIS — Z794 Long term (current) use of insulin: Secondary | ICD-10-CM | POA: Insufficient documentation

## 2024-04-13 DIAGNOSIS — Z720 Tobacco use: Secondary | ICD-10-CM

## 2024-04-13 DIAGNOSIS — I5032 Chronic diastolic (congestive) heart failure: Secondary | ICD-10-CM

## 2024-04-13 DIAGNOSIS — Z79899 Other long term (current) drug therapy: Secondary | ICD-10-CM | POA: Insufficient documentation

## 2024-04-13 DIAGNOSIS — Z7985 Long-term (current) use of injectable non-insulin antidiabetic drugs: Secondary | ICD-10-CM | POA: Diagnosis not present

## 2024-04-13 DIAGNOSIS — N1831 Chronic kidney disease, stage 3a: Secondary | ICD-10-CM

## 2024-04-13 DIAGNOSIS — I428 Other cardiomyopathies: Secondary | ICD-10-CM | POA: Diagnosis not present

## 2024-04-13 DIAGNOSIS — E1142 Type 2 diabetes mellitus with diabetic polyneuropathy: Secondary | ICD-10-CM | POA: Insufficient documentation

## 2024-04-13 DIAGNOSIS — Z87891 Personal history of nicotine dependence: Secondary | ICD-10-CM | POA: Diagnosis not present

## 2024-04-13 DIAGNOSIS — E785 Hyperlipidemia, unspecified: Secondary | ICD-10-CM | POA: Diagnosis not present

## 2024-04-13 DIAGNOSIS — E782 Mixed hyperlipidemia: Secondary | ICD-10-CM | POA: Diagnosis not present

## 2024-04-13 DIAGNOSIS — J449 Chronic obstructive pulmonary disease, unspecified: Secondary | ICD-10-CM | POA: Insufficient documentation

## 2024-04-13 DIAGNOSIS — I10 Essential (primary) hypertension: Secondary | ICD-10-CM

## 2024-04-13 DIAGNOSIS — E1122 Type 2 diabetes mellitus with diabetic chronic kidney disease: Secondary | ICD-10-CM | POA: Diagnosis not present

## 2024-04-13 DIAGNOSIS — I5022 Chronic systolic (congestive) heart failure: Secondary | ICD-10-CM | POA: Insufficient documentation

## 2024-04-13 DIAGNOSIS — E669 Obesity, unspecified: Secondary | ICD-10-CM | POA: Diagnosis not present

## 2024-04-13 NOTE — Progress Notes (Signed)
   04/13/2024  Patient ID: Kelsey Mcdowell, female   DOB: 1946-01-24, 78 y.o.   MRN: [id number]  Patient outreach to verify patient has Ozempic  2mg .  Novo PAP states 4 boxes of 2mg  were delivered to [facility] 4/15.  Patient endorses having 1 box of Ozempic  2mg  picked up from [facility] as well as leftover 1mg  she was using 2 injections of to equal 2mg .  Informed her that 2mg  pens from Novo PAP are available for pick up at [facility] at her convenience and recommended switching to these for ease of use versus giving 2 injections of 1mg .  Linn Rich, PharmD, DPLA

## 2024-04-13 NOTE — Patient Instructions (Signed)
 It was great to see you today!   If you receive a satisfaction survey regarding the Heart Failure Clinic, please take the time to fill it out. This way we can continue to provide excellent care and make any changes that need to be made.

## 2024-04-14 ENCOUNTER — Ambulatory Visit: Admitting: Family Medicine

## 2024-04-14 ENCOUNTER — Telehealth: Payer: Self-pay

## 2024-04-14 LAB — BASIC METABOLIC PANEL WITH GFR
BUN/Creatinine Ratio: 15 (ref 12–28)
BUN: 26 mg/dL (ref 8–27)
CO2: 22 mmol/L (ref 20–29)
Calcium: 10.2 mg/dL (ref 8.7–10.3)
Chloride: 102 mmol/L (ref 96–106)
Creatinine, Ser: 1.68 mg/dL — ABNORMAL HIGH (ref 0.57–1.00)
Glucose: 199 mg/dL — ABNORMAL HIGH (ref 70–99)
Potassium: 4.7 mmol/L (ref 3.5–5.2)
Sodium: 140 mmol/L (ref 134–144)
eGFR: 31 mL/min/{1.73_m2} — ABNORMAL LOW (ref >59)

## 2024-04-14 LAB — LIPID PANEL
Chol/HDL Ratio: 3.7 ratio (ref 0.0–4.4)
Cholesterol, Total: 230 mg/dL — ABNORMAL HIGH (ref 100–199)
HDL: 63 mg/dL (ref >39)
LDL Chol Calc (NIH): 104 mg/dL — ABNORMAL HIGH (ref 0–99)
Triglycerides: 374 mg/dL — ABNORMAL HIGH (ref 0–149)
VLDL Cholesterol Cal: 63 mg/dL — ABNORMAL HIGH (ref 5–40)

## 2024-04-14 MED ORDER — ROSUVASTATIN CALCIUM 10 MG PO TABS: 10.0000 mg | ORAL_TABLET | Freq: Every day | ORAL | 3 refills | Status: DC

## 2024-04-14 NOTE — Telephone Encounter (Addendum)
 Provider message below with lab results and medication change reviewed with pt. Pt verbalized understanding and agreement. She stated she has a small amount of rosuvastatin  left but would need a new refill and requested it be sent to [facility].  Prescription sent.   ----- Message from Charlette Console sent at 04/14/2024  7:47 AM EDT ----- Potassium is normal. Kidney function is a little worse so make sure drinking 60-64 ounces of fluid daily. Lipids are worse than last time they were checked so please start the rosuvastatin  10mg  daily. Decrease consumption of fried/ fatty foods and increase activity. I will ask your PCP to recheck your kidney function when you see her next month.

## 2024-04-22 ENCOUNTER — Other Ambulatory Visit: Payer: Self-pay | Admitting: Family Medicine

## 2024-04-22 DIAGNOSIS — J449 Chronic obstructive pulmonary disease, unspecified: Secondary | ICD-10-CM | POA: Diagnosis not present

## 2024-04-22 NOTE — Telephone Encounter (Unsigned)
 Copied from CRM [id number]. Topic: Clinical - Medication Refill >> Apr 22, 2024  3:05 PM Rosaria Common wrote: Most Recent Primary Care Visit:  Provider: Terre Ferri P  Department: [facility]  Visit Type: OFFICE VISIT  Date: 04/12/2024  Medication: fluticasone  (FLONASE ) 50 MCG/ACT nasal spray, and nortriptyline  (PAMELOR ) 50 MG capsule  Has the patient contacted their pharmacy? No.  (Agent: If no, request that the patient contact the pharmacy for the refill. If patient does not wish to contact the pharmacy document the reason why and proceed with request.) (Agent: If yes, when and what did the pharmacy advise?)  Is this the correct pharmacy for this prescription? Yes If no, delete pharmacy and type the correct one.  This is the patient's preferred pharmacy:    [facility] [place], [place] - [address] [place] [address] Phone: [phone] Fax: [phone]     Has the prescription been filled recently? No  Is the patient out of the medication? Yes  Has the patient been seen for an appointment in the last year OR does the patient have an upcoming appointment? Yes  Can we respond through MyChart? No  Agent: Please be advised that Rx refills may take up to 3 business days. We ask that you follow-up with your pharmacy.

## 2024-04-26 MED ORDER — NORTRIPTYLINE HCL 50 MG PO CAPS
50.0000 mg | ORAL_CAPSULE | Freq: Every day | ORAL | 0 refills | Status: AC
Start: 2024-04-26 — End: ?

## 2024-04-26 MED ORDER — FLUTICASONE PROPIONATE 50 MCG/ACT NA SUSP
2.0000 | Freq: Every day | NASAL | 12 refills | Status: AC
Start: 2024-04-26 — End: ?

## 2024-04-26 NOTE — Telephone Encounter (Signed)
 Requested medication (s) are due for refill today:   Requested medication (s) are on the active medication list: yes  Last refill:  Pamelor  4/416/25  Future visit scheduled: yes  Notes to clinic:  See requests, Pamelor  is early.    Requested Prescriptions  Pending Prescriptions Disp Refills   fluticasone  (FLONASE ) 50 MCG/ACT nasal spray 16 g 12    Sig: Place 2 sprays into both nostrils daily.     Ear, Nose, and Throat: Nasal Preparations - Corticosteroids Passed - 04/26/2024 12:04 PM      Passed - Valid encounter within last 12 months    Recent Outpatient Visits           2 weeks ago Type 2 diabetes mellitus with cardiac complication [facility])   [place] [facility] [place], Megan P, DO   1 month ago Type 2 diabetes mellitus with cardiac complication [facility])   [place] [facility] [place], Megan P, DO               nortriptyline  (PAMELOR ) 50 MG capsule 90 capsule 0    Sig: Take 1 capsule (50 mg total) by mouth daily.     Psychiatry:  Antidepressants - Heterocyclics (TCAs) Passed - 04/26/2024 12:04 PM      Passed - Completed PHQ-2 or PHQ-9 in the last 360 days      Passed - Valid encounter within last 6 months    Recent Outpatient Visits           2 weeks ago Type 2 diabetes mellitus with cardiac complication [facility])   [place] [facility] [place], Megan P, DO   1 month ago Type 2 diabetes mellitus with cardiac complication [facility])   [place] [facility] [place], Megan P, DO

## 2024-04-27 DIAGNOSIS — E113313 Type 2 diabetes mellitus with moderate nonproliferative diabetic retinopathy with macular edema, bilateral: Secondary | ICD-10-CM | POA: Diagnosis not present

## 2024-04-27 DIAGNOSIS — E113393 Type 2 diabetes mellitus with moderate nonproliferative diabetic retinopathy without macular edema, bilateral: Secondary | ICD-10-CM | POA: Diagnosis not present

## 2024-05-03 ENCOUNTER — Telehealth: Admitting: Family Medicine

## 2024-05-03 ENCOUNTER — Encounter: Payer: Self-pay | Admitting: Family Medicine

## 2024-05-03 VITALS — Ht 60.0 in | Wt 189.0 lb

## 2024-05-03 DIAGNOSIS — T466X5A Adverse effect of antihyperlipidemic and antiarteriosclerotic drugs, initial encounter: Secondary | ICD-10-CM | POA: Diagnosis not present

## 2024-05-03 DIAGNOSIS — E1159 Type 2 diabetes mellitus with other circulatory complications: Secondary | ICD-10-CM

## 2024-05-03 DIAGNOSIS — E782 Mixed hyperlipidemia: Secondary | ICD-10-CM | POA: Diagnosis not present

## 2024-05-03 DIAGNOSIS — F321 Major depressive disorder, single episode, moderate: Secondary | ICD-10-CM | POA: Diagnosis not present

## 2024-05-03 DIAGNOSIS — Z794 Long term (current) use of insulin: Secondary | ICD-10-CM

## 2024-05-03 DIAGNOSIS — M609 Myositis, unspecified: Secondary | ICD-10-CM

## 2024-05-03 DIAGNOSIS — K5903 Drug induced constipation: Secondary | ICD-10-CM

## 2024-05-03 MED ORDER — SEMGLEE (YFGN) 100 UNIT/ML ~~LOC~~ SOPN: 20.0000 [IU] | PEN_INJECTOR | Freq: Every day | SUBCUTANEOUS | Status: DC

## 2024-05-03 MED ORDER — DULOXETINE HCL 30 MG PO CPEP: 30.0000 mg | ORAL_CAPSULE | Freq: Every day | ORAL | 0 refills | Status: DC

## 2024-05-03 NOTE — Progress Notes (Signed)
 Ht 5' (1.524 m)   Wt 189 lb (85.7 kg)   BMI 36.91 kg/m    Subjective:    Patient ID: Kelsey Mcdowell, female    DOB: 09/29/1946, 78 y.o.   MRN: [id number]  HPI: Kelsey Mcdowell is a 78 y.o. female  Chief Complaint  Patient presents with   Diabetes   DIABETES Hypoglycemic episodes:no Polydipsia/polyuria: no Visual disturbance: no Chest pain: no Paresthesias: yes Glucose Monitoring: yes  Accucheck frequency: TID  Fasting glucose: 70, 80-150   Evening: 220, 187, 199, 241  Taking Insulin ?: yes  Long acting insulin : 20 units Blood Pressure Monitoring: not checking Retinal Examination: Up to Date Foot Exam: Up to Date Diabetic Education: Completed Pneumovax: Up to Date Influenza: Up to Date Aspirin: no  Stopped her cholesterol medicine about a week ago.   DEPRESSION Mood status: uncontrolled Satisfied with current treatment?: no Symptom severity: moderate  Duration of current treatment : chronic Side effects: no Medication compliance: good compliance Psychotherapy/counseling: no  Previous psychiatric medications: cymbalta  Depressed mood: yes Anxious mood: yes Anhedonia: yes Significant weight loss or gain: no Insomnia: no  Fatigue: yes Feelings of worthlessness or guilt: no Impaired concentration/indecisiveness: no Suicidal ideations: no Hopelessness: no Crying spells: no    05/03/2024   11:40 AM 04/12/2024    8:15 AM 03/04/2024   10:52 AM 12/05/2023   11:07 AM 09/15/2023    3:13 PM  Depression screen PHQ 2/9  Decreased Interest 1 0 0 0 0  Down, Depressed, Hopeless 1 0 1 0 1  PHQ - 2 Score 2 0 1 0 1  Altered sleeping 2 0 0 2   Tired, decreased energy 3 0 3 2 2   Change in appetite 1 0 3 0 0  Feeling bad or failure about yourself  1 0 3 0   Trouble concentrating 0 0 0 0 0  Moving slowly or fidgety/restless 1 0 0 0 0  Suicidal thoughts 0 0 0 0 0  PHQ-9 Score 10 0 10 4   Difficult doing work/chores Somewhat difficult Not difficult  at all Very difficult Not difficult at all Not difficult at all     Relevant past medical, surgical, family and social history reviewed and updated as indicated. Interim medical history since our last visit reviewed. Allergies and medications reviewed and updated.  Review of Systems  Constitutional: Negative.   HENT: Negative.    Respiratory: Negative.    Cardiovascular: Negative.   Musculoskeletal:  Positive for myalgias. Negative for arthralgias, back pain, gait problem, joint swelling, neck pain and neck stiffness.  Neurological: Negative.   Psychiatric/Behavioral:  Positive for dysphoric mood. Negative for agitation, behavioral problems, confusion, decreased concentration, hallucinations, self-injury, sleep disturbance and suicidal ideas. The patient is nervous/anxious. The patient is not hyperactive.     Per HPI unless specifically indicated above     Objective:     Ht 5' (1.524 m)   Wt 189 lb (85.7 kg)   BMI 36.91 kg/m   Wt Readings from Last 3 Encounters:  05/03/24 189 lb (85.7 kg)  04/13/24 190 lb 2 oz (86.2 kg)  04/12/24 188 lb 8 oz (85.5 kg)    Physical Exam Vitals and nursing note reviewed.  Constitutional:      General: She is not in acute distress.    Appearance: Normal appearance. She is not ill-appearing, toxic-appearing or diaphoretic.  HENT:     Head: Normocephalic and atraumatic.     Right Ear: External ear normal.  Left Ear: External ear normal.     Nose: Nose normal.     Mouth/Throat:     Mouth: Mucous membranes are moist.     Pharynx: Oropharynx is clear.  Eyes:     General: No scleral icterus.       Right eye: No discharge.        Left eye: No discharge.     Conjunctiva/sclera: Conjunctivae normal.     Pupils: Pupils are equal, round, and reactive to light.  Pulmonary:     Effort: Pulmonary effort is normal. No respiratory distress.     Comments: Speaking in full sentences Musculoskeletal:        General: Normal range of motion.      Cervical back: Normal range of motion.  Skin:    Coloration: Skin is not jaundiced or pale.     Findings: No bruising, erythema, lesion or rash.  Neurological:     Mental Status: She is alert and oriented to person, place, and time. Mental status is at baseline.  Psychiatric:        Mood and Affect: Mood normal.        Behavior: Behavior normal.        Thought Content: Thought content normal.        Judgment: Judgment normal.     Results for orders placed or performed in visit on 04/13/24  Basic Metabolic Panel (BMET)   Collection Time: 04/13/24  2:07 PM  Result Value Ref Range   Glucose 199 (H) 70 - 99 mg/dL   BUN 26 8 - 27 mg/dL   Creatinine, Ser 1.61 (H) 0.57 - 1.00 mg/dL   eGFR 31 (L) >09 UE/AVW/0.98   BUN/Creatinine Ratio 15 12 - 28   Sodium 140 134 - 144 mmol/L   Potassium 4.7 3.5 - 5.2 mmol/L   Chloride 102 96 - 106 mmol/L   CO2 22 20 - 29 mmol/L   Calcium  10.2 8.7 - 10.3 mg/dL  Lipid panel   Collection Time: 04/13/24  2:07 PM  Result Value Ref Range   Cholesterol, Total 230 (H) 100 - 199 mg/dL   Triglycerides 119 (H) 0 - 149 mg/dL   HDL 63 >14 mg/dL   VLDL Cholesterol Cal 63 (H) 5 - 40 mg/dL   LDL Chol Calc (NIH) 782 (H) 0 - 99 mg/dL   Chol/HDL Ratio 3.7 0.0 - 4.4 ratio      Assessment & Plan:   Problem List Items Addressed This Visit       Cardiovascular and Mediastinum   Type 2 diabetes mellitus with cardiac complication (HCC) - Primary   Sugars improving. Continue current regimen and recheck in 1 month with A1c. Call with any concerns.       Relevant Medications   SEMGLEE , YFGN, 100 UNIT/ML Pen     Musculoskeletal and Integument   Statin-induced myositis   Unable to tolerate statins- will try her on lowest tolerable dose.         Other   Depression, major, single episode, moderate (HCC)   Not doing well. Will increase her cymbalta  to 30mg  and recheck in 1 month. Call with any concerns.       Relevant Medications   DULoxetine  (CYMBALTA ) 30 MG  capsule   Mixed hyperlipidemia   Having myalgias on crestor . Will hold for 1 month and see how she's doing then- may need to consider other medications.       Other Visit Diagnoses  Drug-induced constipation       Continue stool softener. Recheck in about a month.        Follow up plan: Return in about 4 weeks (around 05/31/2024).    This visit was completed via video visit through MyChart due to the restrictions of the COVID-19 pandemic. All issues as above were discussed and addressed. Physical exam was done as above through visual confirmation on video through MyChart. If it was felt that the patient should be evaluated in the office, they were directed there. The patient verbally consented to this visit. Location of the patient: home Location of the provider: work Those involved with this call:  Provider: Terre Ferri, DO CMA: Linda Repress, CMA, Front Desk/Registration: Jaynee Meyer  Time spent on call: 25 minutes with patient face to face via video conference. More than 50% of this time was spent in counseling and coordination of care. 40 minutes total spent in review of patient's record and preparation of their chart.

## 2024-05-03 NOTE — Assessment & Plan Note (Signed)
 Sugars improving. Continue current regimen and recheck in 1 month with A1c. Call with any concerns.

## 2024-05-03 NOTE — Assessment & Plan Note (Signed)
 Unable to tolerate statins- will try her on lowest tolerable dose.

## 2024-05-03 NOTE — Assessment & Plan Note (Signed)
 Having myalgias on crestor . Will hold for 1 month and see how she's doing then- may need to consider other medications.

## 2024-05-03 NOTE — Assessment & Plan Note (Signed)
 Not doing well. Will increase her cymbalta  to 30mg  and recheck in 1 month. Call with any concerns.

## 2024-05-05 NOTE — Progress Notes (Signed)
 Appointment has been made

## 2024-05-07 ENCOUNTER — Telehealth: Payer: Self-pay

## 2024-05-07 NOTE — Telephone Encounter (Signed)
 Ok for E2C2 to review.   Patient contacted to inform their patient assistance medication has been received in the office. Due to limited space patient has been asked to picked up in the next 2 weeks. Our open office hours are Monday thru Friday 8:000 am to 12:00 pm and again 12:45 pm to 5:00 pm.   At this time she has total of two shipments she must now pickup.   Medication information: Semaglutide , 2 MG/DOSE, 8 MG/3ML

## 2024-05-11 DIAGNOSIS — K529 Noninfective gastroenteritis and colitis, unspecified: Secondary | ICD-10-CM | POA: Diagnosis not present

## 2024-05-11 DIAGNOSIS — Z800 Family history of malignant neoplasm of digestive organs: Secondary | ICD-10-CM | POA: Diagnosis not present

## 2024-05-11 DIAGNOSIS — Z860101 Personal history of adenomatous and serrated colon polyps: Secondary | ICD-10-CM | POA: Diagnosis not present

## 2024-05-20 NOTE — Telephone Encounter (Signed)
 Copied from CRM [id number]. Topic: Clinical - Medication Prior Auth >> May 11, 2024 11:52 AM Lizabeth Riggs wrote: Reason for CRM:  I let Dalena know: Patient contacted to inform their patient assistance medication has been received in the office. Due to limited space patient has been asked to picked up in the next 2 weeks. Our open office hours are Monday thru Friday 8:000 am to 12:00 pm and again 12:45 pm to 5:00 pm.  She will pick up in the next 2 weeks. Thanks

## 2024-05-23 DIAGNOSIS — J449 Chronic obstructive pulmonary disease, unspecified: Secondary | ICD-10-CM | POA: Diagnosis not present

## 2024-05-31 ENCOUNTER — Other Ambulatory Visit: Payer: Self-pay

## 2024-05-31 NOTE — Progress Notes (Signed)
   05/31/2024  Patient ID: Kelsey Mcdowell, female   DOB: 14-Dec-1946, 78 y.o.   MRN: [id number]  Subjective/objective Telephone visit to follow-up on management of type 2 diabetes   Diabetes management plan -Current medications: Farxiga  10 mg daily, Ozempic  2mg  weekly, Lantus  20-30 units BID -Patient does monitor home blood glucose and endorses fasting blood glucose this morning of 139 and post-prandial values around 200 -Patient does not endorse any signs or symptoms of hypoglycemia -Patient is out of Humulin R , so she has been titrating Lantus  based on BG readings, sometimes taking 30 units BID -Last A1c was 7.7% in March, up from 7% on 12/13 -Receives Farxiga  through [facility] PAP and Ozempic  through [facility] [facility]- patient also enrolled in [facility] [employer] Extra Help   Assessment/plan   Diabetes management plan -Continue current regimen at this time until follow-up with Dr. Lincoln Renshaw next week -Continue Farxiga  10mg  daily and Ozempic  2mg  weekly -Would like to manage DM without Regular, Intermediate, or Rapid-acting insulin  if possible due to increased risk of hypoglycemia.  Educated patient that Lantus  dose should be titrated 1-2 units every 1-2 days to goal FBG 100-130 to prevent hypoglycemia.   -Continue regular monitoring of home blood glucose -Patient sees Dr. Lincoln Renshaw again 6/17 and will be due for follow-up A1c   Follow-up: 3 months   Linn Rich, PharmD, DPLA

## 2024-06-08 ENCOUNTER — Ambulatory Visit: Admitting: Family Medicine

## 2024-06-08 ENCOUNTER — Encounter: Payer: Self-pay | Admitting: Family Medicine

## 2024-06-08 VITALS — BP 138/79 | HR 61 | Ht 60.0 in | Wt 196.0 lb

## 2024-06-08 DIAGNOSIS — E1159 Type 2 diabetes mellitus with other circulatory complications: Secondary | ICD-10-CM | POA: Diagnosis not present

## 2024-06-08 LAB — MICROALBUMIN, URINE WAIVED
Creatinine, Urine Waived: 50 mg/dL (ref 10–300)
Microalb, Ur Waived: 30 mg/L — ABNORMAL HIGH (ref 0–19)

## 2024-06-08 LAB — BAYER DCA HB A1C WAIVED: HB A1C (BAYER DCA - WAIVED): 7.8 % — ABNORMAL HIGH (ref 4.8–5.6)

## 2024-06-08 MED ORDER — GABAPENTIN 300 MG PO CAPS: ORAL_CAPSULE | ORAL | 1 refills | Status: DC

## 2024-06-08 MED ORDER — NORTRIPTYLINE HCL 50 MG PO CAPS: 50.0000 mg | ORAL_CAPSULE | Freq: Every day | ORAL | 1 refills | Status: DC

## 2024-06-08 MED ORDER — TRAZODONE HCL 50 MG PO TABS
50.0000 mg | ORAL_TABLET | Freq: Every day | ORAL | 1 refills | Status: AC
Start: 2024-06-08 — End: ?

## 2024-06-08 MED ORDER — DULOXETINE HCL 30 MG PO CPEP: 30.0000 mg | ORAL_CAPSULE | Freq: Every day | ORAL | 1 refills | Status: DC

## 2024-06-08 MED ORDER — OMEPRAZOLE 20 MG PO CPDR: 20.0000 mg | DELAYED_RELEASE_CAPSULE | Freq: Every day | ORAL | 3 refills | Status: AC

## 2024-06-08 MED ORDER — METOPROLOL SUCCINATE ER 25 MG PO TB24: 25.0000 mg | ORAL_TABLET | Freq: Every day | ORAL | 1 refills | Status: DC

## 2024-06-08 MED ORDER — SPIRONOLACTONE 25 MG PO TABS: 25.0000 mg | ORAL_TABLET | Freq: Every day | ORAL | 1 refills | Status: DC

## 2024-06-08 MED ORDER — INSULIN REGULAR HUMAN 100 UNIT/ML IJ SOLN: 10.0000 [IU] | Freq: Three times a day (TID) | INTRAMUSCULAR | 1 refills | Status: DC

## 2024-06-08 MED ORDER — SEMGLEE (YFGN) 100 UNIT/ML ~~LOC~~ SOPN: 30.0000 [IU] | PEN_INJECTOR | Freq: Every day | SUBCUTANEOUS | 0 refills | Status: AC

## 2024-06-08 NOTE — Progress Notes (Signed)
 BP 138/79 (BP Location: Left Arm, Patient Position: Sitting, Cuff Size: Normal)   Pulse 61   Ht 5' (1.524 m)   Wt 196 lb (88.9 kg)   SpO2 95%   BMI 38.28 kg/m    Subjective:    Patient ID: Kelsey Mcdowell, female    DOB: 14-Jun-1946, 78 y.o.   MRN: [id number]  HPI: Kelsey Mcdowell is a 78 y.o. female  Chief Complaint  Patient presents with   Diabetes   DIABETES Hypoglycemic episodes:no Polydipsia/polyuria: yes Visual disturbance: no Chest pain: no Paresthesias: no Glucose Monitoring: yes  Accucheck frequency: BID  Fasting glucose: 96-125  Evening: 200-240 Taking Insulin ?: yes  Long acting insulin : 20 units BID (started on her own)  Short acting insulin : not taking Blood Pressure Monitoring: not checking Retinal Examination: Up to Date Foot Exam: Up to Date Diabetic Education: Completed Pneumovax: Up to Date Influenza: Up to Date Aspirin: no  Relevant past medical, surgical, family and social history reviewed and updated as indicated. Interim medical history since our last visit reviewed. Allergies and medications reviewed and updated.  Review of Systems  Constitutional: Negative.   Respiratory: Negative.    Cardiovascular: Negative.   Musculoskeletal: Negative.   Neurological: Negative.   Psychiatric/Behavioral: Negative.      Per HPI unless specifically indicated above     Objective:    BP 138/79 (BP Location: Left Arm, Patient Position: Sitting, Cuff Size: Normal)   Pulse 61   Ht 5' (1.524 m)   Wt 196 lb (88.9 kg)   SpO2 95%   BMI 38.28 kg/m   Wt Readings from Last 3 Encounters:  06/08/24 196 lb (88.9 kg)  05/03/24 189 lb (85.7 kg)  04/13/24 190 lb 2 oz (86.2 kg)    Physical Exam Vitals and nursing note reviewed.  Constitutional:      General: She is not in acute distress.    Appearance: Normal appearance. She is not ill-appearing, toxic-appearing or diaphoretic.  HENT:     Head: Normocephalic and atraumatic.     Right  Ear: External ear normal.     Left Ear: External ear normal.     Nose: Nose normal.     Mouth/Throat:     Mouth: Mucous membranes are moist.     Pharynx: Oropharynx is clear.   Eyes:     General: No scleral icterus.       Right eye: No discharge.        Left eye: No discharge.     Extraocular Movements: Extraocular movements intact.     Conjunctiva/sclera: Conjunctivae normal.     Pupils: Pupils are equal, round, and reactive to light.    Cardiovascular:     Rate and Rhythm: Normal rate and regular rhythm.     Pulses: Normal pulses.     Heart sounds: Normal heart sounds. No murmur heard.    No friction rub. No gallop.  Pulmonary:     Effort: Pulmonary effort is normal. No respiratory distress.     Breath sounds: Normal breath sounds. No stridor. No wheezing, rhonchi or rales.  Chest:     Chest wall: No tenderness.   Musculoskeletal:        General: Normal range of motion.     Cervical back: Normal range of motion and neck supple.   Skin:    General: Skin is warm and dry.     Capillary Refill: Capillary refill takes less than 2 seconds.     Coloration: Skin is  not jaundiced or pale.     Findings: No bruising, erythema, lesion or rash.   Neurological:     General: No focal deficit present.     Mental Status: She is alert and oriented to person, place, and time. Mental status is at baseline.   Psychiatric:        Mood and Affect: Mood normal.        Behavior: Behavior normal.        Thought Content: Thought content normal.        Judgment: Judgment normal.     Results for orders placed or performed in visit on 06/08/24  Bayer DCA Hb A1c Waived   Collection Time: 06/08/24  3:43 PM  Result Value Ref Range   HB A1C (BAYER DCA - WAIVED) 7.8 (H) 4.8 - 5.6 %  Microalbumin, Urine Waived   Collection Time: 06/08/24  4:12 PM  Result Value Ref Range   Microalb, Ur Waived 30 (H) 0 - 19 mg/L   Creatinine, Urine Waived 50 10 - 300 mg/dL   Microalb/Creat Ratio 30-300 (H) <30  mg/g      Assessment & Plan:   Problem List Items Addressed This Visit       Cardiovascular and Mediastinum   Type 2 diabetes mellitus with cardiac complication (HCC) - Primary   Sugars still running high with A1c of 7.8 up from 7.7. Has not been doing well with her diet. Stressed the importance of not just eating junk food. Will change her semglee  to 30 units 1x a day (has been self administering 20 units BID) and restart her humalin. Follow up virtually in 1 week to go over sugars and adjust insulin . Call with any concerns.       Relevant Medications   SEMGLEE , YFGN, 100 UNIT/ML Pen   insulin  regular (HUMULIN R ) 100 units/mL injection   metoprolol  succinate (TOPROL -XL) 25 MG 24 hr tablet   spironolactone  (ALDACTONE ) 25 MG tablet   Other Relevant Orders   Bayer DCA Hb A1c Waived (Completed)   Microalbumin, Urine Waived (Completed)     Follow up plan: Return in about 1 week (around 06/15/2024) for virtual OK.

## 2024-06-08 NOTE — Patient Instructions (Signed)
 Semglee  1x a day 30 units at bedtime Humalin 10-15 units with meals

## 2024-06-09 ENCOUNTER — Encounter: Payer: Self-pay | Admitting: Family Medicine

## 2024-06-09 NOTE — Assessment & Plan Note (Signed)
 Sugars still running high with A1c of 7.8 up from 7.7. Has not been doing well with her diet. Stressed the importance of not just eating junk food. Will change her semglee  to 30 units 1x a day (has been self administering 20 units BID) and restart her humalin. Follow up virtually in 1 week to go over sugars and adjust insulin . Call with any concerns.

## 2024-06-09 NOTE — Telephone Encounter (Signed)
 Spoke with patient again to advise her medication remains here for pickup. Her car is broke down right now but she will work on a ride.

## 2024-06-10 DIAGNOSIS — I5032 Chronic diastolic (congestive) heart failure: Secondary | ICD-10-CM | POA: Diagnosis not present

## 2024-06-10 DIAGNOSIS — G4733 Obstructive sleep apnea (adult) (pediatric): Secondary | ICD-10-CM | POA: Diagnosis not present

## 2024-06-10 DIAGNOSIS — J449 Chronic obstructive pulmonary disease, unspecified: Secondary | ICD-10-CM | POA: Diagnosis not present

## 2024-06-10 DIAGNOSIS — F17210 Nicotine dependence, cigarettes, uncomplicated: Secondary | ICD-10-CM | POA: Diagnosis not present

## 2024-06-12 DIAGNOSIS — J449 Chronic obstructive pulmonary disease, unspecified: Secondary | ICD-10-CM | POA: Diagnosis not present

## 2024-06-13 DIAGNOSIS — J449 Chronic obstructive pulmonary disease, unspecified: Secondary | ICD-10-CM | POA: Diagnosis not present

## 2024-06-14 ENCOUNTER — Telehealth: Payer: Self-pay | Admitting: Family Medicine

## 2024-06-14 DIAGNOSIS — H43823 Vitreomacular adhesion, bilateral: Secondary | ICD-10-CM | POA: Diagnosis not present

## 2024-06-14 DIAGNOSIS — Z961 Presence of intraocular lens: Secondary | ICD-10-CM | POA: Diagnosis not present

## 2024-06-14 DIAGNOSIS — E113313 Type 2 diabetes mellitus with moderate nonproliferative diabetic retinopathy with macular edema, bilateral: Secondary | ICD-10-CM | POA: Diagnosis not present

## 2024-06-14 DIAGNOSIS — H34811 Central retinal vein occlusion, right eye, with macular edema: Secondary | ICD-10-CM | POA: Diagnosis not present

## 2024-06-14 DIAGNOSIS — H35033 Hypertensive retinopathy, bilateral: Secondary | ICD-10-CM | POA: Diagnosis not present

## 2024-06-14 DIAGNOSIS — E113312 Type 2 diabetes mellitus with moderate nonproliferative diabetic retinopathy with macular edema, left eye: Secondary | ICD-10-CM | POA: Diagnosis not present

## 2024-06-14 NOTE — Telephone Encounter (Signed)
 Copied from CRM [id number]. Topic: Medicare AWV >> Jun 14, 2024 10:14 AM Nathanel DEL wrote: Reason for CRM: LVM 06/14/2024 reminder call for AWV appt on Tuesday 06/16/2023   Nathanel Paschal; Care Guide Ambulatory Clinical Support [place] [facility] Direct Dial: [phone]

## 2024-06-15 ENCOUNTER — Other Ambulatory Visit: Payer: Self-pay | Admitting: Specialist

## 2024-06-15 ENCOUNTER — Ambulatory Visit: Admitting: Emergency Medicine

## 2024-06-15 VITALS — Ht 60.0 in | Wt 196.0 lb

## 2024-06-15 DIAGNOSIS — J449 Chronic obstructive pulmonary disease, unspecified: Secondary | ICD-10-CM

## 2024-06-15 DIAGNOSIS — F17210 Nicotine dependence, cigarettes, uncomplicated: Secondary | ICD-10-CM

## 2024-06-15 DIAGNOSIS — H9193 Unspecified hearing loss, bilateral: Secondary | ICD-10-CM

## 2024-06-15 DIAGNOSIS — Z0000 Encounter for general adult medical examination without abnormal findings: Secondary | ICD-10-CM | POA: Diagnosis not present

## 2024-06-15 NOTE — Patient Instructions (Signed)
 [name] , Thank you for taking time out of your busy schedule to complete your Annual Wellness Visit with me. I enjoyed our conversation and look forward to speaking with you again next year. I, as well as your care team,  appreciate your ongoing commitment to your health goals. Please review the following plan we discussed and let me know if I can assist you in the future. Your Game plan/ To Do List    Referrals: I have placed a referral to [facility] phone#  [phone]) to evaluate your hearing loss. Someone from their office should call you, if you haven't heard from the office you've been referred to, please reach out to them at the phone provided.   Follow up Visits: Next Medicare AWV with our clinical staff: 06/21/25 @ 10:40am (PHONE VISIT)   Have you seen your provider in the last 6 months (3 months if uncontrolled diabetes)? Yes Next Office Visit with your provider: 06/23/24 @ 1:40pm with Dr. Vicci  Clinician Recommendations: Call [phone] to schedule a low dose lung CT to screen for lung cancer. Get the Shingrix (shingles) vaccines at your local pharmacy at your convenience.  Aim for 30 minutes of exercise or brisk walking, 6-8 glasses of water, and 5 servings of fruits and vegetables each day.       This is a list of the screening recommended for you and due dates:  Health Maintenance  Topic Date Due   Zoster (Shingles) Vaccine (1 of 2) [birth year]/09/1965   Screening for Lung Cancer  Never done   Colon Cancer Screening  01/22/2024   Complete foot exam   06/11/2024   Flu Shot  07/23/2024   Mammogram  09/16/2024   Hemoglobin A1C  12/08/2024   Eye exam for diabetics  02/16/2025   Yearly kidney function blood test for diabetes  04/13/2025   Yearly kidney health urinalysis for diabetes  06/08/2025   Medicare Annual Wellness Visit  06/15/2025   DEXA scan (bone density measurement)  09/16/2028   DTaP/Tdap/Td vaccine (2 - Tdap) 06/11/2033   Pneumococcal Vaccine for age over 69   Completed   Hepatitis C Screening  Completed   Hepatitis B Vaccine  Aged Out   HPV Vaccine  Aged Out   Meningitis B Vaccine  Aged Out   COVID-19 Vaccine  Discontinued    Advanced directives: (Copy Requested) Please bring a copy of your health care power of attorney and living will to the office to be added to your chart at your convenience. You can mail to [facility] [address] [place], [place] [address] or email to [email or web address] Advance Care Planning is important because it:  [x]  Makes sure you receive the medical care that is consistent with your values, goals, and preferences  [x]  It provides guidance to your family and loved ones and reduces their decisional burden about whether or not they are making the right decisions based on your wishes.  Follow the link provided in your after visit summary or read over the paperwork we have mailed to you to help you started getting your Advance Directives in place. If you need assistance in completing these, please reach out to us  so that we can help you!  See attachments for Preventive Care and Fall Prevention Tips.  Fall Prevention in the Home, Adult Falls can cause injuries and affect people of all ages. There are many simple things that you can do to make your home safe and to help  prevent falls. If you need it, ask for help making these changes. What actions can I take to prevent falls? General information Use good lighting in all rooms. Make sure to: Replace any light bulbs that burn out. Turn on lights if it is dark and use night-lights. Keep items that you use often in easy-to-reach places. Lower the shelves around your home if needed. Move furniture so that there are clear paths around it. Do not keep throw rugs or other things on the floor that can make you trip. If any of your floors are uneven, fix them. Add color or contrast paint or tape to clearly mark and help you see: Grab bars or  handrails. First and last steps of staircases. Where the edge of each step is. If you use a ladder or stepladder: Make sure that it is fully opened. Do not climb a closed ladder. Make sure the sides of the ladder are locked in place. Have someone hold the ladder while you use it. Know where your pets are as you move through your home. What can I do in the bathroom?     Keep the floor dry. Clean up any water that is on the floor right away. Remove soap buildup in the bathtub or shower. Buildup makes bathtubs and showers slippery. Use non-skid mats or decals on the floor of the bathtub or shower. Attach bath mats securely with double-sided, non-slip rug tape. If you need to sit down while you are in the shower, use a non-slip stool. Install grab bars by the toilet and in the bathtub and shower. Do not use towel bars as grab bars. What can I do in the bedroom? Make sure that you have a light by your bed that is easy to reach. Do not use any sheets or blankets on your bed that hang to the floor. Have a firm bench or chair with side arms that you can use for support when you get dressed. What can I do in the kitchen? Clean up any spills right away. If you need to reach something above you, use a sturdy step stool that has a grab bar. Keep electrical cables out of the way. Do not use floor polish or wax that makes floors slippery. What can I do with my stairs? Do not leave anything on the stairs. Make sure that you have a light switch at the top and the bottom of the stairs. Have them installed if you do not have them. Make sure that there are handrails on both sides of the stairs. Fix handrails that are broken or loose. Make sure that handrails are as long as the staircases. Install non-slip stair treads on all stairs in your home if they do not have carpet. Avoid having throw rugs at the top or bottom of stairs, or secure the rugs with carpet tape to prevent them from moving. Choose a  carpet design that does not hide the edge of steps on the stairs. Make sure that carpet is firmly attached to the stairs. Fix any carpet that is loose or worn. What can I do on the outside of my home? Use bright outdoor lighting. Repair the edges of walkways and driveways and fix any cracks. Clear paths of anything that can make you trip, such as tools or rocks. Add color or contrast paint or tape to clearly mark and help you see high doorway thresholds. Trim any bushes or trees on the main path into your home. Check that handrails are  securely fastened and in good repair. Both sides of all steps should have handrails. Install guardrails along the edges of any raised decks or porches. Have leaves, snow, and ice cleared regularly. Use sand, salt, or ice melt on walkways during winter months if you live where there is ice and snow. In the garage, clean up any spills right away, including grease or oil spills. What other actions can I take? Review your medicines with your health care provider. Some medicines can make you confused or feel dizzy. This can increase your chance of falling. Wear closed-toe shoes that fit well and support your feet. Wear shoes that have rubber soles and low heels. Use a cane, walker, scooter, or crutches that help you move around if needed. Talk with your provider about other ways that you can decrease your risk of falls. This may include seeing a physical therapist to learn to do exercises to improve movement and strength. Where to find more information Centers for Disease Control and Prevention, STEADI: TonerPromos.no [employer] on Aging: BaseRingTones.pl National Institute on Aging: BaseRingTones.pl Contact a health care provider if: You are afraid of falling at home. You feel weak, drowsy, or dizzy at home. You fall at home. Get help right away if you: Lose consciousness or have trouble moving after a fall. Have a fall that causes a head injury. These symptoms may be an  emergency. Get help right away. Call 911. Do not wait to see if the symptoms will go away. Do not drive yourself to the hospital. This information is not intended to replace advice given to you by your health care provider. Make sure you discuss any questions you have with your health care provider. Document Revised: 08/12/2022 Document Reviewed: 08/12/2022 Elsevier Patient Education  2024 [employer].

## 2024-06-15 NOTE — Progress Notes (Signed)
 Subjective:   Kelsey Mcdowell is a 78 y.o. who presents for a Medicare Wellness preventive visit.  As a reminder, Annual Wellness Visits don't include a physical exam, and some assessments may be limited, especially if this visit is performed virtually. We may recommend an in-person follow-up visit with your provider if needed.  Visit Complete: Virtual I connected with  Kelsey Mcdowell on 06/15/24 by a audio enabled telemedicine application and verified that I am speaking with the correct person using two identifiers.  Patient Location: Home  Provider Location: Home Office  I discussed the limitations of evaluation and management by telemedicine. The patient expressed understanding and agreed to proceed.  Vital Signs: Because this visit was a virtual/telehealth visit, some criteria may be missing or patient reported. Any vitals not documented were not able to be obtained and vitals that have been documented are patient reported.  VideoDeclined- This patient declined Librarian, academic. Therefore the visit was completed with audio only.  Persons Participating in Visit: Patient.  AWV Questionnaire: No: Patient Medicare AWV questionnaire was not completed prior to this visit.  Cardiac Risk Factors include: advanced age (>21men, >50 women);diabetes mellitus;hypertension;dyslipidemia;obesity (BMI >30kg/m2);sedentary lifestyle;smoking/ tobacco exposure     Objective:    Today's Vitals   06/15/24 1042  Weight: 196 lb (88.9 kg)  Height: 5' (1.524 m)  PainSc: 4    Body mass index is 38.28 kg/m.     06/15/2024   11:00 AM 01/21/2023   12:27 PM 07/09/2022    7:04 AM 01/28/2022    1:00 PM 01/28/2022   12:22 PM 01/27/2022   11:00 PM 01/05/2022    3:55 PM  Advanced Directives  Does Patient Have a Medical Advance Directive? Yes Yes Yes  Yes Unable to assess, patient is non-responsive or altered mental status No  Type of Advance Directive  Healthcare Power of [place];Living will Living will;Healthcare Power of Attorney  Living will;Healthcare Power of [employer] Power of [place];Living will    Does patient want to make changes to medical advance directive? No - Patient declined        Copy of Healthcare Power of Attorney in Chart? No - copy requested No - copy requested   No - copy requested      Current Medications (verified) Outpatient Encounter Medications as of 06/15/2024  Medication Sig   acetaminophen  (TYLENOL ) 500 MG tablet Take 500 mg by mouth every 6 (six) hours as needed (back pain).   Blood Glucose Monitoring Suppl (ONE TOUCH ULTRA 2) w/Device KIT 1 each by Does not apply route daily at 2 PM.   calcitRIOL (ROCALTROL) 0.25 MCG capsule Take 0.25 mcg by mouth daily.   cholecalciferol (VITAMIN D3) 25 MCG (1000 UNIT) tablet Take 1,000 Units by mouth daily.   dapagliflozin  propanediol (FARXIGA ) 10 MG TABS tablet Take 1 tablet (10 mg total) by mouth daily before breakfast.   DULoxetine  (CYMBALTA ) 30 MG capsule Take 1 capsule (30 mg total) by mouth daily.   fluticasone  (FLONASE ) 50 MCG/ACT nasal spray Place 2 sprays into both nostrils daily.   gabapentin  (NEURONTIN ) 300 MG capsule Take 1 cap in the AM and 2 in the PM   glucose blood (ONETOUCH ULTRA) test strip 1 each by Other route daily at 2 PM. Use as instructed   Insulin  Pen Needle 34G X 3.5 MM MISC 1 Dose by Does not apply route daily.   insulin  regular (HUMULIN R ) 100 units/mL injection Inject 0.1-0.15 mLs (10-15 Units total) into the  skin 3 (three) times daily before meals.   Lancets (ONETOUCH ULTRASOFT) lancets 1 each by Other route daily at 2 PM. Use as instructed   metoprolol  succinate (TOPROL -XL) 25 MG 24 hr tablet Take 1 tablet (25 mg total) by mouth daily.   Multiple Vitamin (MULTIVITAMIN WITH MINERALS) TABS tablet Take 1 tablet by mouth daily.   nortriptyline  (PAMELOR ) 50 MG capsule Take 1 capsule (50 mg total) by mouth daily.   omeprazole  (PRILOSEC) 20  MG capsule Take 1 capsule (20 mg total) by mouth daily.   ONETOUCH VERIO test strip 1 each by Other route 3 (three) times daily.   sacubitril -valsartan  (ENTRESTO ) 24-26 MG Take 1 tablet by mouth 2 (two) times daily.   Secukinumab (COSENTYX Leon) Inject into the skin every 30 (thirty) days. Last date give was the week of 11/25/22. Pt unsure of exact date.   Semaglutide , 2 MG/DOSE, 8 MG/3ML SOPN Inject 2 mg as directed once a week.   SEMGLEE , YFGN, 100 UNIT/ML Pen Inject 30 Units into the skin at bedtime for 30 doses.   spironolactone  (ALDACTONE ) 25 MG tablet Take 1 tablet (25 mg total) by mouth daily.   traZODone  (DESYREL ) 50 MG tablet Take 1 tablet (50 mg total) by mouth at bedtime. (Patient taking differently: Take 50 mg by mouth at bedtime. prn)   triamcinolone  ointment (KENALOG ) 0.5 % Apply 1 Application topically daily as needed.   vitamin B-12 (CYANOCOBALAMIN) 1000 MCG tablet Take 1,000 mcg by mouth daily.   nicotine  (NICODERM CQ  - DOSED IN MG/24 HR) 7 mg/24hr patch Place 1 patch (7 mg total) onto the skin daily. (Patient not taking: Reported on 06/15/2024)   ondansetron  (ZOFRAN -ODT) 4 MG disintegrating tablet Take 1 tablet (4 mg total) by mouth every 8 (eight) hours as needed for nausea or vomiting. (Patient not taking: Reported on 06/15/2024)   No facility-administered encounter medications on file as of 06/15/2024.    Allergies (verified) Amoxicillin and Glipizide   History: Past Medical History:  Diagnosis Date   Arthritis    CHF (congestive heart failure) (HCC)    COPD (chronic obstructive pulmonary disease) (HCC)    Diabetes mellitus without complication (HCC)    GERD (gastroesophageal reflux disease)    Hyperlipemia    Hypertension    Myocardial infarction (HCC)    light heart attack ?when   Neuropathy    Sleep apnea    Past Surgical History:  Procedure Laterality Date   ABDOMINAL HYSTERECTOMY     APPENDECTOMY     BREAST EXCISIONAL BIOPSY Left 1970's   benign    CHOLECYSTECTOMY     COLONOSCOPY WITH PROPOFOL  N/A 09/30/2018   Procedure: COLONOSCOPY WITH PROPOFOL ;  Surgeon: Toledo, Ladell POUR, MD;  Location: [facility];  Service: Gastroenterology;  Laterality: N/A;   COLONOSCOPY WITH PROPOFOL  N/A 07/09/2022   Procedure: COLONOSCOPY WITH PROPOFOL ;  Surgeon: Maryruth Ole DASEN, MD;  Location: [facility]COPY;  Service: Endoscopy;  Laterality: N/A;  IDDM   COLONOSCOPY WITH PROPOFOL  N/A 01/21/2023   Procedure: COLONOSCOPY WITH PROPOFOL ;  Surgeon: Maryruth Ole DASEN, MD;  Location: [facility] END[facility]COPY;  Service: Endoscopy;  Laterality: N/A;   ESOPHAGOGASTRODUODENOSCOPY (EGD) WITH PROPOFOL  N/A 07/09/2022   Procedure: ESOPHAGOGASTRODUODENOSCOPY (EGD) WITH PROPOFOL ;  Surgeon: Maryruth Ole DASEN, MD;  Location: [facility] END[facility]COPY;  Service: Endoscopy;  Laterality: N/A;   SPINE SURGERY  1998   cervical fusion   Family History  Problem Relation Age of Onset   Cancer Mother    Hypertension Mother    Diabetes Mother  Heart disease Father    Diabetes Father    Cancer Sister    Breast cancer Sister 14   Heart disease Brother    Social History   Socioeconomic History   Marital status: Widowed    Spouse name: Not on file   Number of children: 0   Years of education: Not on file   Highest education level: Not on file  Occupational History   Not on file  Tobacco Use   Smoking status: Every Day    Current packs/day: 0.50    Average packs/day: 1.5 packs/day for 61.3 years (91.8 ttl pk-yrs)    Types: Cigarettes    Start date: 1964    Last attempt to quit: 02/01/2024   Smokeless tobacco: Never  Vaping Use   Vaping status: Never Used  Substance and Sexual Activity   Alcohol  use: Not Currently    Comment: occasional, last use 2024   Drug use: No   Sexual activity: Not Currently  Other Topics Concern   Not on file  Social History Narrative   Not on file   Social Drivers of Health   Financial Resource Strain: Low Risk  (06/15/2024)   Overall Financial  Resource Strain (CARDIA)    Difficulty of Paying Living Expenses: Not hard at all  Food Insecurity: No Food Insecurity (06/15/2024)   Hunger Vital Sign    Worried About Running Out of Food in the Last Year: Never true    Ran Out of Food in the Last Year: Never true  Transportation Needs: No Transportation Needs (06/15/2024)   PRAPARE - Administrator, Civil Service (Medical): No    Lack of Transportation (Non-Medical): No  Physical Activity: Inactive (06/15/2024)   Exercise Vital Sign    Days of Exercise per Week: 0 days    Minutes of Exercise per Session: 0 min  Stress: Stress Concern Present (06/15/2024)   [employer] of Occupational Health - Occupational Stress Questionnaire    Feeling of Stress: To some extent  Social Connections: Moderately Isolated (06/15/2024)   Social Connection and Isolation Panel    Frequency of Communication with Friends and Family: More than three times a week    Frequency of Social Gatherings with Friends and Family: More than three times a week    Attends Religious Services: More than 4 times per year    Active Member of [employer] or Organizations: No    Attends Banker Meetings: Never    Marital Status: Widowed    Tobacco Counseling Ready to quit: Not Answered Counseling given: Not Answered    Clinical Intake:  Pre-visit preparation completed: Yes  Pain : 0-10 Pain Score: 4  Pain Type: Chronic pain Pain Location: Back Pain Descriptors / Indicators: Aching     BMI - recorded: 38.28 Nutritional Status: BMI > 30  Obese Nutritional Risks: None Diabetes: Yes CBG done?: No (FBS 86 per patient) Did pt. bring in CBG monitor from home?: No  Lab Results  Component Value Date   HGBA1C 7.8 (H) 06/08/2024   HGBA1C 7.7 (H) 03/04/2024   HGBA1C 7.0 (H) 12/05/2023     How often do you need to have someone help you when you read instructions, pamphlets, or other written materials from your doctor or pharmacy?: 1 -  Never  Interpreter Needed?: No  Information entered by :: Vina Ned, CMA   Activities of Daily Living     06/15/2024   10:45 AM  In your present state of health, do  you have any difficulty performing the following activities:  Hearing? 1  Comment referral place to [facility]  Vision? 0  Difficulty concentrating or making decisions? 0  Walking or climbing stairs? 1  Comment due to balance, doesn't use any assistive devices  Dressing or bathing? 0  Doing errands, shopping? 0  Preparing Food and eating ? N  Using the Toilet? N  In the past six months, have you accidently leaked urine? Y  Comment wears depends sometimes  Do you have problems with loss of bowel control? N  Managing your Medications? N  Managing your Finances? Y  Comment niece Neurosurgeon or managing your Housekeeping? N    Patient Care Team: Vicci Duwaine SQUIBB, DO as PCP - General (Family Medicine) Deanna Channing LABOR, [facility] (Pharmacist) Carolee Manus DASEN., MD (Ophthalmology) Josh Server, MD as Referring Physician (Ophthalmology) Theotis Lavelle BRAVO, MD as Referring Physician (Pulmonary Disease) Maryruth Ole DASEN, MD as Consulting Physician (Gastroenterology) Donette Ellouise LABOR, FNP Ammon Blunt, MD as Consulting Physician (Cardiology) Lateef, Munsoor, MD (Nephrology)  I have updated your Care Teams any recent Medical Services you may have received from other providers in the past year.     Assessment:   This is a routine wellness examination for [place].  Hearing/Vision screen Hearing Screening - Comments:: Patient report hearing loss R&gt;L, referral placed to ENT Vision Screening - Comments:: Gets DM eye exams, Dr. Manus Carolee,  [place]   Goals Addressed               This Visit's Progress     Weight (lb) < 140 lb (63.5 kg) (pt-stated)   196 lb (88.9 kg)      Depression Screen     06/15/2024   10:58 AM 05/03/2024   11:40 AM 04/12/2024    8:15 AM 03/04/2024    10:52 AM 12/05/2023   11:07 AM 09/15/2023    3:13 PM 08/05/2023   10:17 AM  PHQ 2/9 Scores  PHQ - 2 Score 0 2 0 1 0 1 1  PHQ- 9 Score 3 10 0 10 4  4     Fall Risk     06/15/2024   11:02 AM 03/04/2024   10:52 AM 12/05/2023   11:08 AM 08/05/2023   10:17 AM 05/08/2023   10:45 AM  Fall Risk   Falls in the past year? 1 0 1 0 1  Number falls in past yr: 1 0 0 0 1  Injury with Fall? 1 0 0 0 0  Risk for fall due to : Impaired balance/gait;History of fall(s);Orthopedic patient No Fall Risks History of fall(s) No Fall Risks History of fall(s)  Follow up Falls evaluation completed;Education provided Falls evaluation completed  Falls evaluation completed Falls evaluation completed    MEDICARE RISK AT HOME:  Medicare Risk at Home Any stairs in or around the home?: Yes If so, are there any without handrails?: No Home free of loose throw rugs in walkways, pet beds, electrical cords, etc?: Yes Adequate lighting in your home to reduce risk of falls?: Yes Life alert?: Yes (doesn't use) Use of a cane, walker or w/c?: No Grab bars in the bathroom?: No Shower chair or bench in shower?: No Elevated toilet seat or a handicapped toilet?: Yes  TIMED UP AND GO:  Was the test performed?  No  Cognitive Function: 6CIT completed        06/15/2024   11:03 AM 06/12/2023   11:18 AM  6CIT Screen  What Year? 0  points 0 points  What month? 0 points 0 points  What time? 0 points 0 points  Count back from 20 0 points 4 points  Months in reverse 0 points 0 points  Repeat phrase 0 points 2 points  Total Score 0 points 6 points    Immunizations Immunization History  Administered Date(s) Administered   Fluad Trivalent(High Dose 65+) 09/15/2023   Influenza, Seasonal, Injecte, Preservative Fre 10/17/2008, 10/05/2010   PNEUMOCOCCAL CONJUGATE-20 06/12/2023   Pneumococcal Polysaccharide-23 03/27/2009   Td 06/12/2023   Zoster, Live 05/13/2011    Screening Tests Health Maintenance  Topic Date Due    Zoster Vaccines- Shingrix (1 of 2) 05/09/1965   Lung Cancer Screening  Never done   Colonoscopy  01/22/2024   FOOT EXAM  06/11/2024   INFLUENZA VACCINE  07/23/2024   MAMMOGRAM  09/16/2024   HEMOGLOBIN A1C  12/08/2024   OPHTHALMOLOGY EXAM  02/16/2025   Diabetic kidney evaluation - eGFR measurement  04/13/2025   Diabetic kidney evaluation - Urine ACR  06/08/2025   Medicare Annual Wellness (AWV)  06/15/2025   DEXA SCAN  09/16/2028   DTaP/Tdap/Td (2 - Tdap) 06/11/2033   Pneumococcal Vaccine: 50+ Years  Completed   Hepatitis C Screening  Completed   Hepatitis B Vaccines  Aged Out   HPV VACCINES  Aged Out   Meningococcal B Vaccine  Aged Out   COVID-19 Vaccine  Discontinued    Health Maintenance  Health Maintenance Due  Topic Date Due   Zoster Vaccines- Shingrix (1 of 2) 05/09/1965   Lung Cancer Screening  Never done   Colonoscopy  01/22/2024   FOOT EXAM  06/11/2024   Health Maintenance Items Addressed: See Nurse Notes at the end of this note  Additional Screening:  Vision Screening: Recommended annual ophthalmology exams for early detection of glaucoma and other disorders of the eye. Would you like a referral to an eye doctor? No    Dental Screening: Recommended annual dental exams for proper oral hygiene  Community Resource Referral / Chronic Care Management: CRR required this visit?  No   CCM required this visit?  No   Plan:    I have personally reviewed and noted the following in the patient's chart:   Medical and social history Use of alcohol , tobacco or illicit drugs  Current medications and supplements including opioid prescriptions. Patient is not currently taking opioid prescriptions. Functional ability and status Nutritional status Physical activity Advanced directives List of other physicians Hospitalizations, surgeries, and ER visits in previous 12 months Vitals Screenings to include cognitive, depression, and falls Referrals and appointments  In  addition, I have reviewed and discussed with patient certain preventive protocols, quality metrics, and best practice recommendations. A written personalized care plan for preventive services as well as general preventive health recommendations were provided to patient.   Vina Ned, CMA   06/15/2024   After Visit Summary: (Mail) Due to this being a telephonic visit, the after visit summary with patients personalized plan was offered to patient via mail   Notes:  FBS 86 this morning per patient Placed referral to ENT for hearing loss Needs Shingrix vaccine (pharmacy) Colonoscopy scheduled for 08/10/24 LDCT scan ordered by pulmonology (gave patient ph# to call and schedule) Needs DM foot exam at next OV on 06/23/24 Declined DM & Nutrition education referral Declined Covid vaccines

## 2024-06-21 DIAGNOSIS — N2581 Secondary hyperparathyroidism of renal origin: Secondary | ICD-10-CM | POA: Diagnosis not present

## 2024-06-21 DIAGNOSIS — N1832 Chronic kidney disease, stage 3b: Secondary | ICD-10-CM | POA: Diagnosis not present

## 2024-06-21 DIAGNOSIS — I5022 Chronic systolic (congestive) heart failure: Secondary | ICD-10-CM | POA: Diagnosis not present

## 2024-06-21 DIAGNOSIS — E1122 Type 2 diabetes mellitus with diabetic chronic kidney disease: Secondary | ICD-10-CM | POA: Diagnosis not present

## 2024-06-23 ENCOUNTER — Encounter: Payer: Self-pay | Admitting: Family Medicine

## 2024-06-23 ENCOUNTER — Telehealth: Admitting: Family Medicine

## 2024-06-23 VITALS — Ht 60.0 in | Wt 196.0 lb

## 2024-06-23 DIAGNOSIS — E1159 Type 2 diabetes mellitus with other circulatory complications: Secondary | ICD-10-CM

## 2024-06-23 DIAGNOSIS — Z794 Long term (current) use of insulin: Secondary | ICD-10-CM | POA: Diagnosis not present

## 2024-06-23 NOTE — Assessment & Plan Note (Signed)
 AM sugars down to 86, and after insulin  PM sugars around 170. Discussed the importance of diet. Will get nursing involved to help with diet and diabetic education. Continue current regimen now with 30 units of lantus  and 10-15 units TID with meals of humalin. Recheck in 1 month. Call with any concerns.

## 2024-06-23 NOTE — Progress Notes (Signed)
 Ht 5' (1.524 m)   Wt 196 lb (88.9 kg)   BMI 38.28 kg/m    Subjective:    Patient ID: Kelsey Mcdowell, female    DOB: [birth year], 78 y.o.   MRN: [id number]  HPI: Kelsey Mcdowell is a 78 y.o. female  Chief Complaint  Patient presents with   Diabetes   DIABETES Hypoglycemic episodes:no Polydipsia/polyuria: no Visual disturbance: no Chest pain: no Paresthesias: yes Glucose Monitoring: yes  Accucheck frequency: TID  Fasting glucose: 86, 170  Post prandial: 400 before insulin , 170 after insulin  Taking Insulin ?: yes  Long acting insulin : 30 units 1x a day  Short acting insulin : 10-15 units 3x a day Blood Pressure Monitoring: rarely Retinal Examination: Up to Date Foot Exam: Not up to Date Diabetic Education: Completed Pneumovax: Up to Date Influenza: Up to Date Aspirin: no  Relevant past medical, surgical, family and social history reviewed and updated as indicated. Interim medical history since our last visit reviewed. Allergies and medications reviewed and updated.  Review of Systems  Constitutional:  Positive for fatigue. Negative for activity change, appetite change, chills, diaphoresis, fever and unexpected weight change.  Respiratory: Negative.    Cardiovascular: Negative.   Musculoskeletal: Negative.   Neurological: Negative.   Psychiatric/Behavioral: Negative.      Per HPI unless specifically indicated above     Objective:    Ht 5' (1.524 m)   Wt 196 lb (88.9 kg)   BMI 38.28 kg/m   Wt Readings from Last 3 Encounters:  06/23/24 196 lb (88.9 kg)  06/15/24 196 lb (88.9 kg)  06/08/24 196 lb (88.9 kg)    Physical Exam Vitals and nursing note reviewed.  Constitutional:      General: She is not in acute distress.    Appearance: Normal appearance. She is obese. She is not ill-appearing, toxic-appearing or diaphoretic.  HENT:     Head: Normocephalic and atraumatic.     Right Ear: External ear normal.     Left Ear: External ear  normal.     Nose: Nose normal.     Mouth/Throat:     Mouth: Mucous membranes are moist.     Pharynx: Oropharynx is clear.  Eyes:     General: No scleral icterus.       Right eye: No discharge.        Left eye: No discharge.     Conjunctiva/sclera: Conjunctivae normal.     Pupils: Pupils are equal, round, and reactive to light.  Pulmonary:     Effort: Pulmonary effort is normal. No respiratory distress.     Comments: Speaking in full sentences Musculoskeletal:        General: Normal range of motion.     Cervical back: Normal range of motion.  Skin:    Coloration: Skin is not jaundiced or pale.     Findings: No bruising, erythema, lesion or rash.  Neurological:     Mental Status: She is alert and oriented to person, place, and time. Mental status is at baseline.  Psychiatric:        Mood and Affect: Mood normal.        Behavior: Behavior normal.        Thought Content: Thought content normal.        Judgment: Judgment normal.     Results for orders placed or performed in visit on 06/08/24  Bayer DCA Hb A1c Waived   Collection Time: 06/08/24  3:43 PM  Result Value Ref Range  HB A1C (BAYER DCA - WAIVED) 7.8 (H) 4.8 - 5.6 %  Microalbumin, Urine Waived   Collection Time: 06/08/24  4:12 PM  Result Value Ref Range   Microalb, Ur Waived 30 (H) 0 - 19 mg/L   Creatinine, Urine Waived 50 10 - 300 mg/dL   Microalb/Creat Ratio 30-300 (H) <30 mg/g      Assessment & Plan:   Problem List Items Addressed This Visit       Cardiovascular and Mediastinum   Type 2 diabetes mellitus with cardiac complication (HCC) - Primary   AM sugars down to 86, and after insulin  PM sugars around 170. Discussed the importance of diet. Will get nursing involved to help with diet and diabetic education. Continue current regimen now with 30 units of lantus  and 10-15 units TID with meals of humalin. Recheck in 1 month. Call with any concerns.       Relevant Medications   insulin  glargine (LANTUS ) 100  UNIT/ML Solostar Pen   Other Relevant Orders   AMB Referral VBCI Care Management     Follow up plan: Return in about 4 weeks (around 07/21/2024) for [facility] to use same day.    This visit was completed via video visit through MyChart due to the restrictions of the COVID-19 pandemic. All issues as above were discussed and addressed. Physical exam was done as above through visual confirmation on video through MyChart. If it was felt that the patient should be evaluated in the office, they were directed there. The patient verbally consented to this visit. Location of the patient: home Location of the provider: work Those involved with this call:  Provider: Duwaine Louder, DO CMA: York Fogo, CMA, Front Desk/Registration: Wonda Sauers  Time spent on call: 15 minutes with patient face to face via video conference. More than 50% of this time was spent in counseling and coordination of care. 23 minutes total spent in review of patient's record and preparation of their chart.

## 2024-06-24 ENCOUNTER — Telehealth: Payer: Self-pay

## 2024-06-24 NOTE — Progress Notes (Signed)
 Complex Care Management Note Care Guide Note  06/24/2024 Name: Kelsey Mcdowell MRN: [id number] DOB: 02/26/1946   Complex Care Management Outreach Attempts: An unsuccessful telephone outreach was attempted today to offer the patient information about available complex care management services.  Follow Up Plan:  Additional outreach attempts will be made to offer the patient complex care management information and services.   Encounter Outcome:  No Answer  Sig Jeoffrey Buffalo , RMA     [facility]  [facility], [facility] Guide  Direct Dial: [phone]  Website: [email or web address]

## 2024-06-29 NOTE — Progress Notes (Signed)
 Appt scheduled

## 2024-06-30 ENCOUNTER — Ambulatory Visit
Admission: RE | Admit: 2024-06-30 | Discharge: 2024-06-30 | Disposition: A | Discharge status: Home / Self Care | Source: Ambulatory Visit | Attending: Specialist | Admitting: Specialist

## 2024-06-30 DIAGNOSIS — Z122 Encounter for screening for malignant neoplasm of respiratory organs: Secondary | ICD-10-CM | POA: Diagnosis not present

## 2024-06-30 DIAGNOSIS — F17210 Nicotine dependence, cigarettes, uncomplicated: Secondary | ICD-10-CM | POA: Insufficient documentation

## 2024-06-30 DIAGNOSIS — J439 Emphysema, unspecified: Secondary | ICD-10-CM | POA: Insufficient documentation

## 2024-06-30 DIAGNOSIS — I2510 Atherosclerotic heart disease of native coronary artery without angina pectoris: Secondary | ICD-10-CM | POA: Diagnosis not present

## 2024-06-30 DIAGNOSIS — I700 Atherosclerosis of aorta: Secondary | ICD-10-CM | POA: Diagnosis not present

## 2024-06-30 DIAGNOSIS — J449 Chronic obstructive pulmonary disease, unspecified: Secondary | ICD-10-CM

## 2024-06-30 NOTE — Progress Notes (Signed)
 Complex Care Management Note Care Guide Note  06/30/2024 Name: Pippa Hanif MRN: [id number] DOB: 10-01-1946   Complex Care Management Outreach Attempts: A second unsuccessful outreach was attempted today to offer the patient with information about available complex care management services.  Follow Up Plan:  Additional outreach attempts will be made to offer the patient complex care management information and services.   Encounter Outcome:  No Answer  Jeoffrey Buffalo , RMA     [place]  [facility], [facility] Guide  Direct Dial: [phone]  Website: [email or web address]

## 2024-06-30 NOTE — Progress Notes (Signed)
 Complex Care Management Note  Care Guide Note 06/30/2024 Name: Nohelia Valenza MRN: [id number] DOB: 1946-06-01  Monifa Blanchette is a 78 y.o. year old female who sees Vicci Duwaine SQUIBB, DO for primary care. I reached out to [name] by phone today to offer complex care management services.  Ms. Siegrist was given information about Complex Care Management services today including:   The Complex Care Management services include support from the care team which includes your Nurse Care Manager, Clinical Social Worker, or Pharmacist.  The Complex Care Management team is here to help remove barriers to the health concerns and goals most important to you. Complex Care Management services are voluntary, and the patient may decline or stop services at any time by request to their care team member.   Complex Care Management Consent Status: Patient agreed to services and verbal consent obtained.   Follow up plan:  Telephone appointment with complex care management team member scheduled for:  07/07/2024  Encounter Outcome:  Patient Scheduled  Jeoffrey Buffalo , RMA     [place]  [facility] [place], [facility] Guide  Direct Dial: [phone]  Website: [email or web address]

## 2024-07-07 ENCOUNTER — Other Ambulatory Visit: Payer: Self-pay

## 2024-07-07 NOTE — Patient Outreach (Signed)
 Complex Care Management   Visit Note  07/07/2024  Name:  Kelsey Mcdowell MRN: [id number] DOB: [birth year]  Situation: Referral received for Complex Care Management related to Heart Failure, Diabetes with Complications, and Chronic Kidney Disease I obtained verbal consent from Patient.  Visit completed with Kelsey Mcdowell  on the phone. Main concerns today: working on smoking cessation, wearing nicotine  patch. Working with PCP to lower A1C (needs new script for glucometer due to [facility] no longer covering current).  She has run out of Nortriptyline  due to misunderstanding of dosing.   Background:   Past Medical History:  Diagnosis Date   Arthritis    CHF (congestive heart failure) (HCC)    COPD (chronic obstructive pulmonary disease) (HCC)    Diabetes mellitus without complication (HCC)    GERD (gastroesophageal reflux disease)    Hyperlipemia    Hypertension    Myocardial infarction (HCC)    light heart attack ?when   Neuropathy    Sleep apnea     Assessment: Patient Reported Symptoms:  Cognitive Cognitive Status: Alert and oriented to person, place, and time, Insightful and able to interpret abstract concepts, Normal speech and language skills Cognitive/Intellectual Conditions Management [RPT]: None reported or documented in medical history or problem list   Health Maintenance Behaviors: Annual physical exam  Neurological Neurological Review of Symptoms: No symptoms reported Neurological Self-Management Outcome: 4 (good)  HEENT HEENT Symptoms Reported: No symptoms reported HEENT Management Strategies: Medication therapy HEENT Comment: Goes to [facility], Dr. Josh for eye injections every 5 weeks.    Cardiovascular Cardiovascular Symptoms Reported: No symptoms reported    Respiratory Respiratory Symptoms Reported: No symptoms reported Additional Respiratory Details: Uses 2LO2 by concentrator at night, has portable O2 tank if needed. Respiratory  Management Strategies: Oxygen  therapy Respiratory Self-Management Outcome: 4 (good)  Endocrine Endocrine Symptoms Reported: No symptoms reported (A1C 7.8% 06/08/24.) Is patient diabetic?: Yes Is patient checking blood sugars at home?: Yes Endocrine Self-Management Outcome: 3 (uncertain) Endocrine Comment: Needs new script for glucometer, letter received by [facility]- won't cover One-Touch, will cover Accu-Chek Guide.  Contacted PCP office to inform to send new script for monitor/strips/lancets/syringes [facility] as requested by patient.  Gastrointestinal Gastrointestinal Symptoms Reported: No symptoms reported Gastrointestinal Self-Management Outcome: 4 (good) Gastrointestinal Comment: Takes colace 100mg  once daily    Genitourinary Genitourinary Symptoms Reported: No symptoms reported Genitourinary Self-Management Outcome: 4 (good)  Integumentary Integumentary Symptoms Reported: No symptoms reported    Musculoskeletal Musculoskelatal Symptoms Reviewed: Unsteady gait   Falls in the past year?: Yes Amon a month ago, she was lightheaded, she was on the back porch smoking, stood up and got lightheaded, hit her arm on the railing.) Was there an injury with Fall?: No    Psychosocial       Quality of Family Relationships: helpful, supportive      07/07/2024    4:08 PM  Depression screen PHQ 2/9  Decreased Interest 0  Down, Depressed, Hopeless 0  PHQ - 2 Score 0    There were no vitals filed for this visit.  Medications Reviewed Today     Reviewed by Lucian Santana LABOR, RN (Registered Nurse) on 07/07/24 at 1539  Med List Status: <None>   Medication Order Taking? Sig Documenting Provider Last Dose Status Informant  acetaminophen  (TYLENOL ) 500 MG tablet [id number] Yes Take 500 mg by mouth every 6 (six) hours as needed (back pain). [provider]  Active   Blood Glucose Monitoring Suppl (ONE TOUCH ULTRA 2) w/Device  KIT [id number] Yes 1 each by Does not apply route daily at 2 PM.  Vicci Bouchard P, DO  Active   calcitRIOL (ROCALTROL) 0.25 MCG capsule [id number] Yes Take 0.25 mcg by mouth daily.  Patient taking differently: Take 0.25 mcg by mouth daily.   [provider]  Active   cholecalciferol (VITAMIN D3) 25 MCG (1000 UNIT) tablet [id number]  Take 1,000 Units by mouth daily. [provider]  Active   dapagliflozin  propanediol (FARXIGA ) 10 MG TABS tablet [id number] Yes Take 1 tablet (10 mg total) by mouth daily before breakfast. Donette Ellouise LABOR, FNP  Active            Med Note (GENTRY, CHERYL A   Tue Apr 13, 2024 10:27 AM) PAP  DULoxetine  (CYMBALTA ) 30 MG capsule [id number] Yes Take 1 capsule (30 mg total) by mouth daily. Johnson, Megan P, DO  Active   fluticasone  (FLONASE ) 50 MCG/ACT nasal spray [id number] Yes Place 2 sprays into both nostrils daily. Cannady, Jolene T, NP  Active   gabapentin  (NEURONTIN ) 300 MG capsule [id number] Yes Take 1 cap in the AM and 2 in the PM [place], Megan P, DO  Active   glucose blood (ONETOUCH ULTRA) test strip [id number] Yes 1 each by Other route daily at 2 PM. Use as instructed Vicci, Megan P, DO  Active   insulin  glargine (LANTUS ) 100 UNIT/ML Solostar Pen [id number] Yes Inject 30 Units into the skin. [provider]  Active   Insulin  Pen Needle 34G X 3.5 MM MISC [id number] Yes 1 Dose by Does not apply route daily. Josette Ade, MD  Active   insulin  regular (HUMULIN R ) 100 units/mL injection [id number] Yes Inject 0.1-0.15 mLs (10-15 Units total) into the skin 3 (three) times daily before meals. Vicci, Megan P, DO  Active   Lancets [facility] ULTRASOFT) lancets [id number] Yes 1 each by Other route daily at 2 PM. Use as instructed Vicci, Megan P, DO  Active   metoprolol  succinate (TOPROL -XL) 25 MG 24 hr tablet [id number] Yes Take 1 tablet (25 mg total) by mouth daily. Johnson, Megan P, DO  Active   Multiple Vitamin (MULTIVITAMIN WITH MINERALS) TABS tablet [id number] Yes Take 1 tablet by mouth daily. [provider]  Active   nicotine  (NICODERM CQ  - DOSED IN MG/24 HR) 7 mg/24hr patch [id number] Yes Place 1 patch (7 mg total) onto the skin daily. Donette Ellouise A, FNP  Active   nortriptyline  (PAMELOR ) 50 MG capsule [id number] Yes Take 1 capsule (50 mg total) by mouth daily. Johnson, Megan P, DO  Active   omeprazole  (PRILOSEC) 20 MG capsule [id number] Yes Take 1 capsule (20 mg total) by mouth daily. Johnson, Megan P, DO  Active   ondansetron  (ZOFRAN -ODT) 4 MG disintegrating tablet [id number]  Take 1 tablet (4 mg total) by mouth every 8 (eight) hours as needed for nausea or vomiting.  Patient not taking: Reported on 07/07/2024   Vicci Bouchard SQUIBB, DO  Active   [facility] VERIO test strip [id number] Yes 1 each by Other route 3 (three) times daily. Johnson, Megan P, DO  Active   sacubitril -valsartan  (ENTRESTO ) 24-26 MG [id number] Yes Take 1 tablet by mouth 2 (two) times daily. Donette Ellouise LABOR, FNP  Active   Secukinumab (COSENTYX [place]) [id number] Yes Inject into the skin every 30 (thirty) days. Last date give was the week of 11/25/22. Pt unsure of exact date. [provider]  Active            Med Note (GENTRY, CHERYL  A   Wed Sep 24, 2023  8:40 AM) PAP  Semaglutide , 2 MG/DOSE, 8 MG/3ML SOPN [id number] Yes Inject 2 mg as directed once a week. Johnson, Megan P, DO  Active            Med Note (GENTRY, CHERYL A   Tue Apr 13, 2024 10:29 AM) Novo  SEMGLEE , YFGN, 100 UNIT/ML Pen [id number]  Inject 30 Units into the skin at bedtime for 30 doses.  Patient not taking: Reported on 07/07/2024   Vicci Duwaine SQUIBB, DO  Active   spironolactone  (ALDACTONE ) 25 MG tablet [id number] Yes Take 1 tablet (25 mg total) by mouth daily. Johnson, Megan P, DO  Active   traZODone  (DESYREL ) 50 MG tablet [id number] Yes Take 1 tablet (50 mg total) by mouth at bedtime.  Patient taking differently: Take 50 mg by mouth at bedtime. prn   Vicci Duwaine P, DO  Active   triamcinolone  ointment (KENALOG ) 0.5 % [id number] Yes Apply 1 Application  topically daily as needed. [provider]  Active   vitamin B-12 (CYANOCOBALAMIN) 1000 MCG tablet [id number] Yes Take 1,000 mcg by mouth daily. [provider]  Active             Recommendation:   Specialty provider follow-up : Cardiology 08/02/24, Nephrology 10/27/24, Cardiology 12/06/24 DME requests:  other new script for Accu-Chek Glucometer, strips, lancets, syringes.  [employer] sent patient a letter they will no longer cover One-Touch. This RNCM contacted staff at [facility], left message for new scripts to be [facility].  Patient is out of nortriptyline , she misunderstood dosing, was taking 50mg  2 tablets instead of just one tablet.  Previous prescription was for 25mg  2 tablets, patient wasn't aware script was changed to 50mg .  This RNCM contacted staff at [facility] to inform PCP for refill if possible.    Follow Up Plan:   Telephone follow-up two weeks.   Santana Stamp BSN, CCM [place]  VBCI Population Health RN Care Manager Direct Dial: [phone]  Fax: [phone]

## 2024-07-07 NOTE — Patient Instructions (Signed)
 Visit Information  Thank you for taking time to visit with me today. Please don't hesitate to contact me if I can be of assistance to you before our next scheduled appointment.  Our next appointment is by telephone on Wednesday, July 30th at 2:30pm. Please call the care guide team at [phone] if you need to cancel or reschedule your appointment.   Following is a copy of your care plan:   Goals Addressed             This Visit's Progress    VBCI RN Care Plan       Problems:  Chronic Disease Management support and education needs related to Tobacco Use  Goal: Over the next 3 months the Patient will demonstrate Ongoing adherence to prescribed treatment plan for Tobacco Use as evidenced by smoking cessation, hasn't smoked since 07/03/24.   Interventions:   Smoking Cessation Interventions:   Evaluation of current treatment plan reviewed Screening for signs and symptoms of depression  Assessed social determinant of health barriers Offered contact information for smoking cessation, patient declines for now.   Patient Self-Care Activities:  Call pharmacy for medication refills 3-7 days in advance of running out of medications Continue to wear nicotine  patch  Plan:  Telephone follow up appointment with care management team member scheduled for:  two weeks.           VBCI RN Care Plan       Problems:  Chronic Disease Management support and education needs related to DMII  Goal: Over the next 3 months the Patient will demonstrate Improved adherence to prescribed treatment plan for DMII as evidenced by decrease in A1C, current A1C 7.8% on 06/08/24 verbalize basic understanding of DMII disease process and self health management plan as evidenced by review education materials for meal planning, using plate planner, able to list 3 healthy meals.   Interventions:   Diabetes Interventions: Assessed patient's understanding of A1c goal: <7% Reviewed medications with patient and  discussed importance of medication adherence Lab Results  Component Value Date   HGBA1C 7.8 (H) 06/08/2024    Patient Self-Care Activities:  keep appointment with eye doctor check blood sugar at prescribed times: three times daily check feet daily for cuts, sores or redness take the blood sugar log to all doctor visits switch to sugar-free drinks Take humalin and lantus  as prescribed.   Plan:  Telephone follow up appointment with care management team member scheduled for:  two weeks.              A reminder to ALL patients/family/friends, pease call the [place]  National Suicide Prevention Lifeline: (418)361-3147 or TTY: 281-774-6703 TTY (630)191-5842) to talk to a trained counselor if you are experiencing a Mental Health or Behavioral Health Crisis or need someone to talk to.  Patient verbalizes understanding of instructions and care plan provided today and agrees to view in MyChart. Active MyChart status and patient understanding of how to access instructions and care plan via MyChart confirmed with patient.     Santana Stamp BSN, CCM [place]  VBCI Population Health RN Care Manager Direct Dial: [phone]  Fax: [phone]

## 2024-07-08 ENCOUNTER — Other Ambulatory Visit: Payer: Self-pay

## 2024-07-08 DIAGNOSIS — E1159 Type 2 diabetes mellitus with other circulatory complications: Secondary | ICD-10-CM

## 2024-07-08 MED ORDER — ACCU-CHEK GUIDE TEST VI STRP: ORAL_STRIP | 12 refills | Status: AC

## 2024-07-08 MED ORDER — INSULIN PEN NEEDLE 34G X 3.5 MM MISC: 1.0000 | Freq: Every day | 0 refills | Status: DC

## 2024-07-08 MED ORDER — ACCU-CHEK SOFTCLIX LANCETS MISC: 12 refills | Status: DC

## 2024-07-08 MED ORDER — ACCU-CHEK GUIDE ME W/DEVICE KIT: 1.0000 | PACK | Freq: Every day | 0 refills | Status: AC

## 2024-07-08 MED ORDER — NORTRIPTYLINE HCL 50 MG PO CAPS: 50.0000 mg | ORAL_CAPSULE | Freq: Every day | ORAL | 1 refills | Status: DC

## 2024-07-08 NOTE — Telephone Encounter (Signed)
 Copied from CRM [id number]. Topic: Clinical - Prescription Issue >> Jul 07, 2024  4:40 PM Tobias CROME wrote: Reason for CRM: Kelsey Mcdowell with [facility] care manager stats patient received letter from [employer] stating they will not cover her current glucometer. Patient requesting prescription to be [facility] for an accucheck guide ME glucometer. Patient would also need the test strips and lancets.  Patient also in need of syringes.    Kelsey Mcdowell also states patient is completely out of nortiptyline, patient misunderstood and was taking two capsules of a 50mg . Patient did not realize prescription was adjusted to 1 capsule 50mg  from previously taking 2 25mg  capsules. Kelsey Mcdowell inquiring if prescription could be sent in for patient as she is completely out.

## 2024-07-09 MED ORDER — NON FORMULARY: Status: AC

## 2024-07-09 NOTE — Addendum Note (Signed)
 Addended by: VICCI DUWAINE SQUIBB on: 07/09/2024 12:11 PM   Modules accepted: Orders

## 2024-07-09 NOTE — Telephone Encounter (Signed)
 Copied from CRM [id number]. Topic: Clinical - Prescription Issue >> Jul 08, 2024  3:38 PM Donna BRAVO wrote: Reason for CRM: [facility] pharmacy [phone] needing medication clarification  on Insulin  Pen Needle 34G X 3.5 MM MISC

## 2024-07-12 DIAGNOSIS — J449 Chronic obstructive pulmonary disease, unspecified: Secondary | ICD-10-CM | POA: Diagnosis not present

## 2024-07-13 ENCOUNTER — Other Ambulatory Visit: Payer: Self-pay | Admitting: Family Medicine

## 2024-07-14 ENCOUNTER — Telehealth: Payer: Self-pay

## 2024-07-14 NOTE — Telephone Encounter (Signed)
 Received a request for refill from [employer] on Ozempic  we have fax it to provider office to sign and fax it back today.

## 2024-07-15 NOTE — Telephone Encounter (Signed)
 Requested medications are due for refill today.  See pharmacy note  Requested medications are on the active medications list.  yes  Last refill. 07/08/2024  Future visit scheduled.     Notes to clinic.    Pharmacy comment: Need new rx with specific instructions for insurance.      Requested Prescriptions  Pending Prescriptions Disp Refills   Accu-Chek Softclix Lancets lancets [Pharmacy Med Name: SOFTCLIX LANCETS    MIS] 100 each 12    Sig: USE AS DIRECTED     Endocrinology: Diabetes - Testing Supplies Passed - 07/15/2024  1:10 PM      Passed - Valid encounter within last 12 months    Recent Outpatient Visits           3 weeks ago Type 2 diabetes mellitus with cardiac complication (HCC)   [place] [facility] [place], Megan P, DO   1 month ago Type 2 diabetes mellitus with cardiac complication [facility])   [facility] [place], Megan P, DO   2 months ago Type 2 diabetes mellitus with cardiac complication [facility])   [facility] [place], Megan P, DO   3 months ago Type 2 diabetes mellitus with cardiac complication [facility])   [facility] [place], Megan P, DO   4 months ago Type 2 diabetes mellitus with cardiac complication [facility])   [place] [facility] [place], Megan P, DO

## 2024-07-19 DIAGNOSIS — E113313 Type 2 diabetes mellitus with moderate nonproliferative diabetic retinopathy with macular edema, bilateral: Secondary | ICD-10-CM | POA: Diagnosis not present

## 2024-07-21 ENCOUNTER — Other Ambulatory Visit: Payer: Self-pay

## 2024-07-22 NOTE — Patient Outreach (Signed)
 Complex Care Management   Visit Note  07/22/2024  Name:  Kelsey Mcdowell MRN: [id number] DOB: 1945/12/28  Situation: Referral received for Complex Care Management related to DM. I obtained verbal consent from Patient.  Visit completed with Kelsey Mcdowell  on the phone.  Reports her CT scan of lungs came back unremarkable, and she has quit smoking completely, no cigarettes since 07/03/24, states once she sets her mind to stop smoking she will do it.  She has also received her new glucometer that her insurance will now cover.   Background:   Past Medical History:  Diagnosis Date   Arthritis    CHF (congestive heart failure) (HCC)    COPD (chronic obstructive pulmonary disease) (HCC)    Diabetes mellitus without complication (HCC)    GERD (gastroesophageal reflux disease)    Hyperlipemia    Hypertension    Myocardial infarction (HCC)    light heart attack ?when   Neuropathy    Sleep apnea     Assessment: Patient Reported Symptoms:  Cognitive Cognitive Status: Alert and oriented to person, place, and time, No symptoms reported      Neurological Neurological Review of Symptoms: No symptoms reported    HEENT HEENT Symptoms Reported: No symptoms reported      Cardiovascular Cardiovascular Symptoms Reported: No symptoms reported    Respiratory Respiratory Symptoms Reported: No symptoms reported Additional Respiratory Details: Reports she has quit smoking, quit 07/03/24, going well so far. States she had quit smoking for 20 years, had only started back due to stress of taking care of ill husband. Feels once she sets her mind to it, she will quit for good. Respiratory Self-Management Outcome: 4 (good)  Endocrine Endocrine Symptoms Reported: No symptoms reported (FBG morning of 7/30: 136mg /dL)    Gastrointestinal Gastrointestinal Symptoms Reported: No symptoms reported      Genitourinary Genitourinary Symptoms Reported: No symptoms reported    Integumentary Integumentary  Symptoms Reported: No symptoms reported    Musculoskeletal Musculoskelatal Symptoms Reviewed: Unsteady gait        Psychosocial Psychosocial Symptoms Reported: Not assessed            07/07/2024    4:08 PM  Depression screen PHQ 2/9  Decreased Interest 0  Down, Depressed, Hopeless 0  PHQ - 2 Score 0    There were no vitals filed for this visit.  Medications Reviewed Today   Medications were not reviewed in this encounter     Recommendation:   Specialty provider follow-up Cardiology 08/02/24, PCP 08/06/24, Pulmonary 12/06/24 Continue Current Plan of Care -Continue DM diet, cutting down on sugar, use plate planner guide to fill plate with 1/2 veggies, 1/4 protein, 1/4 starch, drink sugar-free drinks.   Follow Up Plan:   Telephone follow-up in 1 month  Santana Stamp BSN, CCM [place]  VBCI Population Health RN Care Manager Direct Dial: [phone]  Fax: [phone]

## 2024-07-22 NOTE — Patient Instructions (Signed)
 Visit Information  Thank you for taking time to visit with me today. Please don't hesitate to contact me if I can be of assistance to you before our next scheduled appointment.  Your next care management appointment is by telephone on Wednesday, August 20th at 2:30pm.    Please call the care guide team at [phone] if you need to cancel, schedule, or reschedule an appointment.   A reminder to ALL patients/family/friends, please call the [place]  National Suicide Prevention Lifeline: (310)166-6255 or TTY: 509-146-1811 TTY 770-257-9640) to talk to a trained counselor if you are experiencing a Mental Health or Behavioral Health Crisis or need someone to talk to.  Santana Stamp BSN, CCM [place]  VBCI Population Health RN Care Manager Direct Dial: [phone]  Fax: [phone]

## 2024-07-26 ENCOUNTER — Other Ambulatory Visit: Payer: Self-pay

## 2024-07-26 NOTE — Telephone Encounter (Signed)
 Called patient to inform that their shipment through PAP (Patient Assistance Program) has been received in office and is available for pickup Monday-Friday 8am - 5pm (lunch 12:15pm-12:45pm office closed).  Patient aware to receive they will need to sign and date the pick up paperwork at the front desk folder.   Company: [employer] Medication received: Ozempic  How many units: 4 boxes  Patient then mentioned she was also waiting on her Entreso and Lantus  to come in as well. We dicussed that Entresto  had a one year rx sent 12/2023 for $12 copay and she only needs to call for refill. Lantus  on the other hand needs a new script through [facility]. Will send that to the pharmacy.

## 2024-08-04 DIAGNOSIS — N1832 Chronic kidney disease, stage 3b: Secondary | ICD-10-CM | POA: Diagnosis not present

## 2024-08-05 ENCOUNTER — Other Ambulatory Visit: Payer: Self-pay | Admitting: Family Medicine

## 2024-08-05 NOTE — Telephone Encounter (Signed)
 Gave [employer] a call to follow up on pt refill reorder Ozempic ,per representative they received the order for ozempic  2 mg and has mail out on August the 5th it will take 10-14 days for provider to received.

## 2024-08-06 ENCOUNTER — Ambulatory Visit: Admitting: Family Medicine

## 2024-08-06 ENCOUNTER — Other Ambulatory Visit: Payer: Self-pay | Admitting: Family Medicine

## 2024-08-08 MED ORDER — INSULIN GLARGINE 100 UNIT/ML SOLOSTAR PEN: 30.0000 [IU] | PEN_INJECTOR | Freq: Every day | SUBCUTANEOUS | 0 refills | Status: DC

## 2024-08-09 NOTE — Telephone Encounter (Signed)
 Requested medications are due for refill today.  unsure  Requested medications are on the active medications list.  yes  Last refill. 06/08/2024   Future visit scheduled.   yes  Notes to clinic.  Rx expired 07/08/2024. Please review for refill.    Requested Prescriptions  Pending Prescriptions Disp Refills   HUMULIN R  100 UNIT/ML injection [Pharmacy Med Name: HumuLIN R  100 UNIT/ML Injection Solution] 10 mL 0    Sig: INJECT 10 TO 15 UNITS SUBCUTANEOUSLY THREE TIMES DAILY BEFORE MEAL(S)     Endocrinology:  Diabetes - Insulins Passed - 08/09/2024  3:16 PM      Passed - HBA1C is between 0 and 7.9 and within 180 days    Hemoglobin A1C  Date Value Ref Range Status  04/24/2023 7.9  Final   HB A1C (BAYER DCA - WAIVED)  Date Value Ref Range Status  06/08/2024 7.8 (H) 4.8 - 5.6 % Final    Comment:             Prediabetes: 5.7 - 6.4          Diabetes: >6.4          Glycemic control for adults with diabetes: <7.0          Passed - Valid encounter within last 6 months    Recent Outpatient Visits           1 month ago Type 2 diabetes mellitus with cardiac complication (HCC)   [facility] [place], Megan P, DO   2 months ago Type 2 diabetes mellitus with cardiac complication [facility])   [facility] [place], Megan P, DO   3 months ago Type 2 diabetes mellitus with cardiac complication [facility])   [facility] [place], Megan P, DO   3 months ago Type 2 diabetes mellitus with cardiac complication [facility])   [place] [place], Megan P, DO   5 months ago Type 2 diabetes mellitus with cardiac complication [facility])    [facility] [place], Megan P, DO

## 2024-08-10 ENCOUNTER — Other Ambulatory Visit: Payer: Self-pay

## 2024-08-10 ENCOUNTER — Encounter
Admission: RE | Disposition: A | Discharge status: Home / Self Care | Payer: Self-pay | Source: Home / Self Care | Attending: Gastroenterology

## 2024-08-10 ENCOUNTER — Ambulatory Visit
Admission: RE | Admit: 2024-08-10 | Discharge: 2024-08-10 | Disposition: A | Discharge status: Home / Self Care | Attending: Gastroenterology | Admitting: Gastroenterology

## 2024-08-10 ENCOUNTER — Other Ambulatory Visit: Payer: Self-pay | Admitting: Gastroenterology

## 2024-08-10 ENCOUNTER — Ambulatory Visit

## 2024-08-10 DIAGNOSIS — I5023 Acute on chronic systolic (congestive) heart failure: Secondary | ICD-10-CM | POA: Diagnosis not present

## 2024-08-10 DIAGNOSIS — Z87891 Personal history of nicotine dependence: Secondary | ICD-10-CM | POA: Insufficient documentation

## 2024-08-10 DIAGNOSIS — K640 First degree hemorrhoids: Secondary | ICD-10-CM | POA: Diagnosis not present

## 2024-08-10 DIAGNOSIS — Z1211 Encounter for screening for malignant neoplasm of colon: Secondary | ICD-10-CM | POA: Diagnosis not present

## 2024-08-10 DIAGNOSIS — D125 Benign neoplasm of sigmoid colon: Secondary | ICD-10-CM | POA: Insufficient documentation

## 2024-08-10 DIAGNOSIS — D123 Benign neoplasm of transverse colon: Secondary | ICD-10-CM | POA: Insufficient documentation

## 2024-08-10 DIAGNOSIS — G4733 Obstructive sleep apnea (adult) (pediatric): Secondary | ICD-10-CM | POA: Diagnosis not present

## 2024-08-10 DIAGNOSIS — Z79899 Other long term (current) drug therapy: Secondary | ICD-10-CM | POA: Diagnosis not present

## 2024-08-10 DIAGNOSIS — D120 Benign neoplasm of cecum: Secondary | ICD-10-CM | POA: Diagnosis not present

## 2024-08-10 DIAGNOSIS — G709 Myoneural disorder, unspecified: Secondary | ICD-10-CM | POA: Insufficient documentation

## 2024-08-10 DIAGNOSIS — Z7984 Long term (current) use of oral hypoglycemic drugs: Secondary | ICD-10-CM | POA: Insufficient documentation

## 2024-08-10 DIAGNOSIS — I5022 Chronic systolic (congestive) heart failure: Secondary | ICD-10-CM | POA: Diagnosis not present

## 2024-08-10 DIAGNOSIS — Z794 Long term (current) use of insulin: Secondary | ICD-10-CM | POA: Insufficient documentation

## 2024-08-10 DIAGNOSIS — J449 Chronic obstructive pulmonary disease, unspecified: Secondary | ICD-10-CM | POA: Diagnosis not present

## 2024-08-10 DIAGNOSIS — Z800 Family history of malignant neoplasm of digestive organs: Secondary | ICD-10-CM | POA: Diagnosis not present

## 2024-08-10 DIAGNOSIS — D175 Benign lipomatous neoplasm of intra-abdominal organs: Secondary | ICD-10-CM | POA: Insufficient documentation

## 2024-08-10 DIAGNOSIS — Z9981 Dependence on supplemental oxygen: Secondary | ICD-10-CM | POA: Insufficient documentation

## 2024-08-10 DIAGNOSIS — I110 Hypertensive heart disease with heart failure: Secondary | ICD-10-CM | POA: Insufficient documentation

## 2024-08-10 DIAGNOSIS — D122 Benign neoplasm of ascending colon: Secondary | ICD-10-CM | POA: Diagnosis not present

## 2024-08-10 DIAGNOSIS — E119 Type 2 diabetes mellitus without complications: Secondary | ICD-10-CM | POA: Insufficient documentation

## 2024-08-10 DIAGNOSIS — F17210 Nicotine dependence, cigarettes, uncomplicated: Secondary | ICD-10-CM | POA: Diagnosis not present

## 2024-08-10 DIAGNOSIS — Z860101 Personal history of adenomatous and serrated colon polyps: Secondary | ICD-10-CM | POA: Diagnosis not present

## 2024-08-10 HISTORY — PX: POLYPECTOMY: SHX149

## 2024-08-10 HISTORY — PX: COLONOSCOPY: SHX5424

## 2024-08-10 LAB — HM COLONOSCOPY

## 2024-08-10 LAB — GLUCOSE, CAPILLARY
Glucose-Capillary: 77 mg/dL (ref 70–99)
Glucose-Capillary: 99 mg/dL (ref 70–99)

## 2024-08-10 SURGERY — COLONOSCOPY
Anesthesia: General

## 2024-08-10 MED ORDER — PROPOFOL 1000 MG/100ML IV EMUL
INTRAVENOUS | Status: AC
  Filled 2024-08-10: qty 100

## 2024-08-10 MED ORDER — DEXTROSE 50 % IV SOLN
25.0000 mL | Freq: Once | INTRAVENOUS | Status: AC
  Administered 2024-08-10: 25 mL via INTRAVENOUS

## 2024-08-10 MED ORDER — LIDOCAINE HCL (PF) 2 % IJ SOLN
INTRAMUSCULAR | Status: AC
  Filled 2024-08-10: qty 5

## 2024-08-10 MED ORDER — PROPOFOL 10 MG/ML IV BOLUS
INTRAVENOUS | Status: DC | PRN
  Administered 2024-08-10 (×2): 40 mg via INTRAVENOUS
  Administered 2024-08-10: 20 mg via INTRAVENOUS
  Administered 2024-08-10: 50 mg via INTRAVENOUS
  Administered 2024-08-10: 20 mg via INTRAVENOUS

## 2024-08-10 MED ORDER — SODIUM CHLORIDE 0.9 % IV SOLN: INTRAVENOUS | Status: DC

## 2024-08-10 MED ORDER — DEXTROSE 50 % IV SOLN
INTRAVENOUS | Status: AC
  Filled 2024-08-10: qty 50

## 2024-08-10 MED ORDER — LIDOCAINE HCL (CARDIAC) PF 100 MG/5ML IV SOSY
PREFILLED_SYRINGE | INTRAVENOUS | Status: DC | PRN
  Administered 2024-08-10: 20 mg via INTRAVENOUS

## 2024-08-10 NOTE — Transfer of Care (Signed)
 Immediate Anesthesia Transfer of Care Note  Patient: Kelsey Mcdowell  Procedure(s) Performed: COLONOSCOPY POLYPECTOMY, INTESTINE  Patient Location: PACU  Anesthesia Type:MAC  Level of Consciousness: drowsy  Airway & Oxygen  Therapy: Patient Spontanous Breathing and Patient connected to nasal cannula oxygen   Post-op Assessment: Report given to RN and Post -op Vital signs reviewed and stable  Post vital signs: Reviewed and stable  Last Vitals:  Vitals Value Taken Time  BP 124/69 08/10/24 08:13  Temp 35.6 C 08/10/24 08:13  Pulse 73 08/10/24 08:13  Resp 13 08/10/24 08:13  SpO2 89 % 08/10/24 08:13    Last Pain:  Vitals:   08/10/24 0813  TempSrc: Temporal  PainSc: Asleep         Complications: No notable events documented.

## 2024-08-10 NOTE — Op Note (Signed)
 [facility] Gastroenterology Patient Name: Kelsey Mcdowell Procedure Date: 08/10/2024 7:10 AM MRN: [id number] Account #: [id number] Date of Birth: 16-Jul-1946 Admit Type: Outpatient Age: 78 Room: [facility] ENDO ROOM 1 Gender: Female Note Status: Finalized Instrument Name: Colon Scope [id number] Procedure:             Colonoscopy Indications:           Surveillance: Piecemeal removal of large sessile                         adenoma last colonoscopy (< 3 yrs), Surveillance:                         History of adenomatous polyps, inadequate prep on last                         exam (<42yr) Providers:             Ole Schick MD, MD Referring MD:          Duwaine MYRTIS Louder (Referring MD) Medicines:             Monitored Anesthesia Care Complications:         No immediate complications. Estimated blood loss:                         Minimal. Procedure:             Pre-Anesthesia Assessment:                        - Prior to the procedure, a History and Physical was                         performed, and patient medications and allergies were                         reviewed. The patient is competent. The risks and                         benefits of the procedure and the sedation options and                         risks were discussed with the patient. All questions                         were answered and informed consent was obtained.                         Patient identification and proposed procedure were                         verified by the physician, the nurse, the                         anesthesiologist, the anesthetist and the technician                         in the endoscopy suite. Mental Status Examination:  alert and oriented. Airway Examination: normal                         oropharyngeal airway and neck mobility. Respiratory                         Examination: clear to auscultation. CV Examination:                          normal. Prophylactic Antibiotics: The patient does not                         require prophylactic antibiotics. Prior                         Anticoagulants: The patient has taken no anticoagulant                         or antiplatelet agents. ASA Grade Assessment: III - A                         patient with severe systemic disease. After reviewing                         the risks and benefits, the patient was deemed in                         satisfactory condition to undergo the procedure. The                         anesthesia plan was to use monitored anesthesia care                         (MAC). Immediately prior to administration of                         medications, the patient was re-assessed for adequacy                         to receive sedatives. The heart rate, respiratory                         rate, oxygen  saturations, blood pressure, adequacy of                         pulmonary ventilation, and response to care were                         monitored throughout the procedure. The physical                         status of the patient was re-assessed after the                         procedure.                        After obtaining informed consent, the colonoscope was  passed under direct vision. Throughout the procedure,                         the patient's blood pressure, pulse, and oxygen                          saturations were monitored continuously. The                         Colonoscope was introduced through the anus and                         advanced to the the cecum, identified by appendiceal                         orifice and ileocecal valve. The colonoscopy was                         performed without difficulty. The patient tolerated                         the procedure well. The quality of the bowel                         preparation was adequate to identify polyps. The                         ileocecal valve, appendiceal  orifice, and rectum were                         photographed. Findings:      The perianal and digital rectal examinations were normal.      Two sessile polyps were found in the cecum. The polyps were 3 mm in       size. These polyps were removed with a cold snare. Resection and       retrieval were complete. Estimated blood loss was minimal.      Five sessile polyps were found in the ascending colon. The polyps were 3       to 4 mm in size. These polyps were removed with a cold snare. Resection       and retrieval were complete. Estimated blood loss was minimal.      There was a medium-sized lipoma, in the proximal transverse colon.      Three sessile polyps were found in the transverse colon. The polyps were       3 to 4 mm in size. These polyps were removed with a cold snare.       Resection and retrieval were complete. Estimated blood loss was minimal.      A tattoo was seen in the transverse colon. The tattoo site appeared       normal.      A 4 mm polyp was found in the sigmoid colon. The polyp was sessile. The       polyp was removed with a cold snare. Resection and retrieval were       complete. Estimated blood loss was minimal.      Internal hemorrhoids were found during retroflexion. The hemorrhoids       were Grade I (internal hemorrhoids that do not prolapse).  The exam was otherwise without abnormality on direct and retroflexion       views. Impression:            - Two 3 mm polyps in the cecum, removed with a cold                         snare. Resected and retrieved.                        - Five 3 to 4 mm polyps in the ascending colon,                         removed with a cold snare. Resected and retrieved.                        - Medium-sized lipoma in the proximal transverse colon.                        - Three 3 to 4 mm polyps in the transverse colon,                         removed with a cold snare. Resected and retrieved.                        - A tattoo  was seen in the transverse colon. The                         tattoo site appeared normal.                        - One 4 mm polyp in the sigmoid colon, removed with a                         cold snare. Resected and retrieved.                        - Internal hemorrhoids.                        - The examination was otherwise normal on direct and                         retroflexion views. Recommendation:        - Discharge patient to home.                        - Resume previous diet.                        - Continue present medications.                        - Await pathology results.                        - Repeat colonoscopy in 1 year for surveillance.                        - Return to referring physician as previously  scheduled. Procedure Code(s):     --- Professional ---                        [phone], Colonoscopy, flexible; with removal of                         tumor(s), polyp(s), or other lesion(s) by snare                         technique Diagnosis Code(s):     --- Professional ---                        D12.0, Benign neoplasm of cecum                        D12.2, Benign neoplasm of ascending colon                        D12.3, Benign neoplasm of transverse colon (hepatic                         flexure or splenic flexure)                        D17.5, Benign lipomatous neoplasm of intra-abdominal                         organs                        K64.0, First degree hemorrhoids                        D12.5, Benign neoplasm of sigmoid colon                        Z86.010, Personal history of colonic polyps CPT copyright 2022 American [employer]. All rights reserved. The codes documented in this report are preliminary and upon coder review may  be revised to meet current compliance requirements. Ole Schick MD, MD 08/10/2024 8:19:49 AM Number of Addenda: 0 Note Initiated On: 08/10/2024 7:10 AM Scope Withdrawal Time: 0 hours 19  minutes 46 seconds  Total Procedure Duration: 0 hours 28 minutes 21 seconds  Estimated Blood Loss:  Estimated blood loss was minimal.      [facility]

## 2024-08-10 NOTE — Anesthesia Preprocedure Evaluation (Signed)
 Anesthesia Evaluation  Patient identified by MRN, date of birth, ID band Patient awake    Reviewed: Allergy & Precautions, NPO status , Patient's Chart, lab work & pertinent test results  History of Anesthesia Complications Negative for: history of anesthetic complications  Airway Mallampati: III       Dental  (+) Upper Dentures, Lower Dentures   Pulmonary sleep apnea, Continuous Positive Airway Pressure Ventilation and Oxygen  sleep apnea , COPD, Current SmokerPatient did not abstain from smoking., former smoker  admitted 2/5-01/29/22 for respiratory failure requiring BiPAP, IV lasix  and IV solu-medrol , felt to be more COPD exacerbation than HF exacerbation. She was discharged back home on O2.   Pulmonary exam normal        Cardiovascular Exercise Tolerance: Poor hypertension, Pt. on medications +CHF  (-) CAD and (-) Past MI Normal cardiovascular exam(-) dysrhythmias   Euvolemic on exam today, no pedal edema. ECHO 2/23: 1. Left ventricular ejection fraction, by estimation, is 40 to 45%. The  left ventricle has mildly decreased function. The left ventricle  demonstrates global hypokinesis. The left ventricular internal cavity size  was mildly dilated. There is mild left  ventricular hypertrophy. Left ventricular diastolic parameters are  consistent with Grade II diastolic dysfunction (pseudonormalization).  2. Right ventricular systolic function is low normal. The right  ventricular size is mildly enlarged.  3. Left atrial size was mildly dilated.  4. Right atrial size was mildly dilated.  5. The mitral valve is normal in structure. No evidence of mitral valve  regurgitation. No evidence of mitral stenosis.  6. The aortic valve is normal in structure. Aortic valve regurgitation is  not visualized. No aortic stenosis is present.  7. The inferior vena cava is normal in size with greater than 50%  respiratory variability,  suggesting right atrial pressure of 3 mmHg.   Neuro/Psych  PSYCHIATRIC DISORDERS  Depression     Neuromuscular disease    GI/Hepatic Neg liver ROS,GERD  Medicated,,  Endo/Other  diabetes, Well Controlled, Type 2, Oral Hypoglycemic Agents, Insulin  Dependent    Renal/GU      Musculoskeletal   Abdominal   Peds  Hematology  (+) Blood dyscrasia, anemia   Anesthesia Other Findings Past Medical History: No date: Arthritis No date: CHF (congestive heart failure) (HCC) No date: COPD (chronic obstructive pulmonary disease) (HCC) No date: Diabetes mellitus without complication (HCC) No date: GERD (gastroesophageal reflux disease) No date: Hyperlipemia No date: Hypertension No date: Myocardial infarction [facility])     Comment:  light heart attack ?when No date: Neuropathy No date: Sleep apnea  Reproductive/Obstetrics                              Anesthesia Physical Anesthesia Plan  ASA: 3  Anesthesia Plan: General   Post-op Pain Management: Minimal or no pain anticipated   Induction: Intravenous  PONV Risk Score and Plan: 2 and Propofol  infusion and TIVA  Airway Management Planned: Nasal Cannula  Additional Equipment: None  Intra-op Plan:   Post-operative Plan:   Informed Consent: I have reviewed the patients History and Physical, chart, labs and discussed the procedure including the risks, benefits and alternatives for the proposed anesthesia with the patient or authorized representative who has indicated his/her understanding and acceptance.     Dental advisory given  Plan Discussed with: CRNA and Surgeon  Anesthesia Plan Comments: (Discussed risks of anesthesia with patient, including possibility of difficulty with spontaneous ventilation under anesthesia necessitating airway  intervention, PONV, and rare risks such as cardiac or respiratory or neurological events, and allergic reactions. Discussed the role of CRNA in patient's perioperative  care. Patient understands.)        Anesthesia Quick Evaluation

## 2024-08-10 NOTE — Anesthesia Postprocedure Evaluation (Signed)
 Anesthesia Post Note  Patient: Sandria Mcenroe  Procedure(s) Performed: COLONOSCOPY POLYPECTOMY, INTESTINE  Patient location during evaluation: Endoscopy Anesthesia Type: General Level of consciousness: awake and alert Pain management: pain level controlled Vital Signs Assessment: post-procedure vital signs reviewed and stable Respiratory status: spontaneous breathing, nonlabored ventilation, respiratory function stable and patient connected to nasal cannula oxygen  Cardiovascular status: blood pressure returned to baseline and stable Postop Assessment: no apparent nausea or vomiting Anesthetic complications: no   No notable events documented.   Last Vitals:  Vitals:   08/10/24 0821 08/10/24 0823  BP:  115/62  Pulse: 68 65  Resp: 10 20  Temp:    SpO2: 96% 95%    Last Pain:  Vitals:   08/10/24 0823  TempSrc:   PainSc: 0-No pain                 [place]by [place]

## 2024-08-10 NOTE — H&P (Signed)
 Outpatient short stay form Pre-procedure 08/10/2024  Kelsey Mcdowell Schick, MD  Primary Physician: Vicci Duwaine SQUIBB, DO  Reason for visit:  Surveillance  History of present illness:    78 y/o lady with history of COPD on 2 L of home O2, DM II, HFrEF, and OSA here for colonoscopy due to polyps (piecemeal resection) and poor prep on last colonoscopy. No blood thinners. History of hysterectomy, appendectomy, and fistula repair. Sister had colon cancer in her 63's.    Current Facility-Administered Medications:    0.9 %  sodium chloride  infusion, , Intravenous, Continuous, Zyshonne Malecha, Kelsey ONEIDA, MD, Last Rate: 20 mL/hr at 08/10/24 0720, Continued from Pre-op at 08/10/24 0720  Facility-Administered Medications Prior to Admission  Medication Dose Route Frequency Provider Last Rate Last Admin   NON FORMULARY   Other UD        Medications Prior to Admission  Medication Sig Dispense Refill Last Dose/Taking   acetaminophen  (TYLENOL ) 500 MG tablet Take 500 mg by mouth every 6 (six) hours as needed (back pain).   08/09/2024   cholecalciferol (VITAMIN D3) 25 MCG (1000 UNIT) tablet Take 1,000 Units by mouth daily.   Past Week   dapagliflozin  propanediol (FARXIGA ) 10 MG TABS tablet Take 1 tablet (10 mg total) by mouth daily before breakfast. 90 tablet 3 Past Week   docusate sodium  (COLACE) 100 MG capsule Take 100 mg by mouth daily.   08/09/2024   DULoxetine  (CYMBALTA ) 30 MG capsule Take 1 capsule (30 mg total) by mouth daily. 90 capsule 1 08/09/2024   fluticasone  (FLONASE ) 50 MCG/ACT nasal spray Place 2 sprays into both nostrils daily. 16 g 12 08/09/2024   gabapentin  (NEURONTIN ) 300 MG capsule Take 1 cap in the AM and 2 in the PM 270 capsule 1 08/09/2024   insulin  glargine (LANTUS ) 100 UNIT/ML Solostar Pen Inject 30 Units into the skin at bedtime. 15 mL 0 08/09/2024   metoprolol  succinate (TOPROL -XL) 25 MG 24 hr tablet Take 1 tablet (25 mg total) by mouth daily. 180 tablet 1 08/09/2024   Multiple Vitamin  (MULTIVITAMIN WITH MINERALS) TABS tablet Take 1 tablet by mouth daily.   Past Week   nortriptyline  (PAMELOR ) 50 MG capsule Take 1 capsule (50 mg total) by mouth daily. 30 capsule 1 08/09/2024   omeprazole  (PRILOSEC) 20 MG capsule Take 1 capsule (20 mg total) by mouth daily. 90 capsule 3 08/09/2024   sacubitril -valsartan  (ENTRESTO ) 24-26 MG Take 1 tablet by mouth 2 (two) times daily. 180 tablet 3 08/09/2024   spironolactone  (ALDACTONE ) 25 MG tablet Take 1 tablet (25 mg total) by mouth daily. 90 tablet 1 08/09/2024   vitamin B-12 (CYANOCOBALAMIN) 1000 MCG tablet Take 1,000 mcg by mouth daily.   Past Week   Accu-Chek Softclix Lancets lancets 1 each by Other route in the morning, at noon, and at bedtime. 100 each 12    Blood Glucose Monitoring Suppl (ACCU-CHEK GUIDE ME) w/Device KIT 1 each by Does not apply route daily. 1 kit 0    calcitRIOL (ROCALTROL) 0.25 MCG capsule Take 0.25 mcg by mouth daily. (Patient taking differently: Take 0.25 mcg by mouth daily.)      glucose blood (ACCU-CHEK GUIDE TEST) test strip Use as instructed 100 each 12    insulin  regular (HUMULIN R ) 100 units/mL injection Inject 0.1-0.15 mLs (10-15 Units total) into the skin 3 (three) times daily before meals. 13.5 mL 1    nicotine  (NICODERM CQ  - DOSED IN MG/24 HR) 7 mg/24hr patch Place 1 patch (7 mg total) onto  the skin daily. 28 patch 3    ondansetron  (ZOFRAN -ODT) 4 MG disintegrating tablet Take 1 tablet (4 mg total) by mouth every 8 (eight) hours as needed for nausea or vomiting. (Patient not taking: Reported on 07/07/2024) 60 tablet 2    Secukinumab (COSENTYX [place]) Inject into the skin every 30 (thirty) days. Last date give was the week of 11/25/22. Pt unsure of exact date.      Semaglutide , 2 MG/DOSE, 8 MG/3ML SOPN Inject 2 mg as directed once a week. 3 mL 1 08/05/2024   traZODone  (DESYREL ) 50 MG tablet Take 1 tablet (50 mg total) by mouth at bedtime. (Patient taking differently: Take 50 mg by mouth at bedtime. prn) 90 tablet 1     triamcinolone  ointment (KENALOG ) 0.5 % Apply 1 Application topically daily as needed.        Allergies  Allergen Reactions   Amoxicillin Other (See Comments)    Other Reaction: GI upset, cramps   Glipizide Other (See Comments)     Past Medical History:  Diagnosis Date   Arthritis    CHF (congestive heart failure) (HCC)    COPD (chronic obstructive pulmonary disease) (HCC)    Diabetes mellitus without complication (HCC)    GERD (gastroesophageal reflux disease)    Hyperlipemia    Hypertension    Myocardial infarction (HCC)    light heart attack ?when   Neuropathy    Sleep apnea     Review of systems:  Otherwise negative.    Physical Exam  Gen: Alert, oriented. Appears stated age.  HEENT: PERRLA. Lungs: No respiratory distress CV: RRR Abd: soft, benign, no masses Ext: No edema    Planned procedures: Proceed with colonoscopy. The patient understands the nature of the planned procedure, indications, risks, alternatives and potential complications including but not limited to bleeding, infection, perforation, damage to internal organs and possible oversedation/side effects from anesthesia. The patient agrees and gives consent to proceed.  Please refer to procedure notes for findings, recommendations and patient disposition/instructions.     Kelsey Mcdowell Schick, MD [facility] Gastroenterology

## 2024-08-10 NOTE — Interval H&P Note (Signed)
 History and Physical Interval Note:  08/10/2024 7:32 AM  Kelsey Mcdowell  has presented today for surgery, with the diagnosis of Hx of adenomatous colonic polyps (Z86.0101).  The various methods of treatment have been discussed with the patient and family. After consideration of risks, benefits and other options for treatment, the patient has consented to  Procedure(s): COLONOSCOPY (N/A) as a surgical intervention.  The patient's history has been reviewed, patient examined, no change in status, stable for surgery.  I have reviewed the patient's chart and labs.  Questions were answered to the patient's satisfaction.     Kelsey Mcdowell  Ok to proceed with colonoscopy

## 2024-08-10 NOTE — Telephone Encounter (Signed)
 Requested Prescriptions  Pending Prescriptions Disp Refills   insulin  regular (HUMULIN R ) 100 units/mL injection [Pharmacy Med Name: HumuLIN R  100 UNIT/ML Injection Solution] 10 mL 0    Sig: INJECT SUBCUTANEOUSLY 10 TO 15  UNITS 3 TIMES DAILY BEFORE MEALS     Endocrinology:  Diabetes - Insulins Passed - 08/10/2024  8:24 AM      Passed - HBA1C is between 0 and 7.9 and within 180 days    Hemoglobin A1C  Date Value Ref Range Status  04/24/2023 7.9  Final   HB A1C (BAYER DCA - WAIVED)  Date Value Ref Range Status  06/08/2024 7.8 (H) 4.8 - 5.6 % Final    Comment:             Prediabetes: 5.7 - 6.4          Diabetes: >6.4          Glycemic control for adults with diabetes: <7.0          Passed - Valid encounter within last 6 months    Recent Outpatient Visits           1 month ago Type 2 diabetes mellitus with cardiac complication (HCC)   [place] [facility] [place], Megan P, DO   2 months ago Type 2 diabetes mellitus with cardiac complication [facility])   [place] [facility] [place], Megan P, DO   3 months ago Type 2 diabetes mellitus with cardiac complication [facility])   [facility] [place], Megan P, DO   4 months ago Type 2 diabetes mellitus with cardiac complication [facility])   [place] [facility] [address], Megan P, DO   5 months ago Type 2 diabetes mellitus with cardiac complication [facility])   [place] [facility] [place], Megan P, DO

## 2024-08-11 ENCOUNTER — Other Ambulatory Visit: Payer: Self-pay | Admitting: Family Medicine

## 2024-08-11 ENCOUNTER — Other Ambulatory Visit: Payer: Self-pay

## 2024-08-11 ENCOUNTER — Telehealth: Payer: Self-pay

## 2024-08-11 LAB — SURGICAL PATHOLOGY

## 2024-08-11 NOTE — Patient Outreach (Signed)
 Contacted staff at [facility] to inform patient is out of refills for Humulin R  and to send refill request to [place] on [address].

## 2024-08-11 NOTE — Telephone Encounter (Unsigned)
 Copied from CRM #[id number]. Topic: Clinical - Medication Refill >> Aug 11, 2024  3:46 PM Yolanda T wrote: Medication: insulin  regular (HUMULIN R ) 100 units/mL injection  Has the patient contacted their pharmacy? No (Agent: If no, request that the patient contact the pharmacy for the refill. If patient does not wish to contact the pharmacy document the reason why and proceed with request.) (Agent: If yes, when and what did the pharmacy advise?)  This is the patient's preferred pharmacy:  [facility] [address], [place] - [address] [place] [address] Phone: [phone] Fax: [phone]  Is this the correct pharmacy for this prescription? Yes  Has the prescription been filled recently? Yes  Is the patient out of the medication? No  Has the patient been seen for an appointment in the last year OR does the patient have an upcoming appointment? Yes  Can we respond through MyChart? No  Agent: Please be advised that Rx refills may take up to 3 business days. We ask that you follow-up with your pharmacy.

## 2024-08-11 NOTE — Patient Outreach (Signed)
 Complex Care Management   Visit Note  08/11/2024  Name:  Kelsey Mcdowell MRN: [id number] DOB: [birth year]  Situation: Referral received for Complex Care Management related to DM, CHF, CKD3.  I obtained verbal consent from Patient.  Visit completed with Patient  on the phone. Main concern is seeing PCP for fracture of spine that was found on Low Dose CT scan of lungs (taken 06/30/24).  Rates lower back pain of 0/10 with light activity, rates at 5/10 with moderate activity, pain is relived with rest/heating pad. Takes gabapentin  as ordered.   Background:   Past Medical History:  Diagnosis Date   Arthritis    CHF (congestive heart failure) (HCC)    COPD (chronic obstructive pulmonary disease) (HCC)    Diabetes mellitus without complication (HCC)    GERD (gastroesophageal reflux disease)    Hyperlipemia    Hypertension    Myocardial infarction (HCC)    light heart attack ?when   Neuropathy    Sleep apnea     Assessment: Patient Reported Symptoms:  Cognitive Cognitive Status: Alert and oriented to person, place, and time, No symptoms reported, Normal speech and language skills, Insightful and able to interpret abstract concepts      Neurological Neurological Review of Symptoms: No symptoms reported    HEENT HEENT Symptoms Reported: No symptoms reported      Cardiovascular Cardiovascular Symptoms Reported: No symptoms reported    Respiratory Respiratory Symptoms Reported: No symptoms reported    Endocrine Endocrine Symptoms Reported: No symptoms reported    Gastrointestinal Gastrointestinal Symptoms Reported: No symptoms reported      Genitourinary Genitourinary Symptoms Reported: No symptoms reported    Integumentary Integumentary Symptoms Reported: No symptoms reported    Musculoskeletal Musculoskelatal Symptoms Reviewed: Unsteady gait Additional Musculoskeletal Details: No changes in gait, has neuropathy, takes gabapentin  300mg  1 tab in AM, 2 tabs in PM. Reports  lower back pain, rates at 5/10 with moderate activity, goes back down to 0/10 with rest/heating pad. She will discuss the findings on the low-dose CT scan of lungs that found spine fracture with PCP at 09/02/24 visit.        Psychosocial Psychosocial Symptoms Reported: Not assessed          08/11/2024    PHQ2-9 Depression Screening   Little interest or pleasure in doing things    Feeling down, depressed, or hopeless    PHQ-2 - Total Score    Trouble falling or staying asleep, or sleeping too much    Feeling tired or having little energy    Poor appetite or overeating     Feeling bad about yourself - or that you are a failure or have let yourself or your family down    Trouble concentrating on things, such as reading the newspaper or watching television    Moving or speaking so slowly that other people could have noticed.  Or the opposite - being so fidgety or restless that you have been moving around a lot more than usual    Thoughts that you would be better off dead, or hurting yourself in some way    PHQ2-9 Total Score    If you checked off any problems, how difficult have these problems made it for you to do your work, take care of things at home, or get along with other people    Depression Interventions/Treatment      There were no vitals filed for this visit.  Medications Reviewed Today     Reviewed by  Leni Pankonin, Santana LABOR, RN (Registered Nurse) on 08/11/24 at 1547  Med List Status: <None>   Medication Order Taking? Sig Documenting Provider Last Dose Status Informant  Accu-Chek Softclix Lancets lancets [id number]  1 each by Other route in the morning, at noon, and at bedtime. Johnson, Megan P, DO  Active   acetaminophen  (TYLENOL ) 500 MG tablet [id number]  Take 500 mg by mouth every 6 (six) hours as needed (back pain). [provider]  Active   Blood Glucose Monitoring Suppl (ACCU-CHEK GUIDE ME) w/Device KIT [id number] Yes 1 each by Does not apply route daily. Johnson,  Megan P, DO  Active   calcitRIOL (ROCALTROL) 0.25 MCG capsule [id number]  Take 0.25 mcg by mouth daily.  Patient taking differently: Take 0.25 mcg by mouth daily.   [provider]  Active   cholecalciferol (VITAMIN D3) 25 MCG (1000 UNIT) tablet [id number]  Take 1,000 Units by mouth daily. [provider]  Active   dapagliflozin  propanediol (FARXIGA ) 10 MG TABS tablet [id number]  Take 1 tablet (10 mg total) by mouth daily before breakfast. Donette Ellouise LABOR, FNP  Active            Med Note (GENTRY, CHERYL A   Tue Apr 13, 2024 10:27 AM) PAP  docusate sodium  (COLACE) 100 MG capsule [id number]  Take 100 mg by mouth daily. [provider]  Active   DULoxetine  (CYMBALTA ) 30 MG capsule [id number]  Take 1 capsule (30 mg total) by mouth daily. Johnson, Megan P, DO  Active   fluticasone  (FLONASE ) 50 MCG/ACT nasal spray [id number]  Place 2 sprays into both nostrils daily. Cannady, Jolene T, NP  Active   gabapentin  (NEURONTIN ) 300 MG capsule [id number] Yes Take 1 cap in the AM and 2 in the PM [place], Megan P, DO  Active   glucose blood (ACCU-CHEK GUIDE TEST) test strip [id number]  Use as instructed Vicci, Megan P, DO  Active   insulin  glargine (LANTUS ) 100 UNIT/ML Solostar Pen [id number]  Inject 30 Units into the skin at bedtime. Vicci, Megan P, DO  Active   insulin  regular (HUMULIN R ) 100 units/mL injection [id number] Yes INJECT SUBCUTANEOUSLY 10 TO 15  UNITS 3 TIMES DAILY BEFORE MEALS Johnson, Megan P, DO  Active   metoprolol  succinate (TOPROL -XL) 25 MG 24 hr tablet [id number]  Take 1 tablet (25 mg total) by mouth daily. Johnson, Megan P, DO  Active   Multiple Vitamin (MULTIVITAMIN WITH MINERALS) TABS tablet [id number]  Take 1 tablet by mouth daily. [provider]  Active   nicotine  (NICODERM CQ  - DOSED IN MG/24 HR) 7 mg/24hr patch [id number]  Place 1 patch (7 mg total) onto the skin daily. Donette Ellouise LABOR, [place]  Active   NON FORMULARY [id number]   Vicci Duwaine SQUIBB, DO  Active    nortriptyline  (PAMELOR ) 50 MG capsule [id number]  Take 1 capsule (50 mg total) by mouth daily. Johnson, Megan P, DO  Active   omeprazole  (PRILOSEC) 20 MG capsule [id number]  Take 1 capsule (20 mg total) by mouth daily. Johnson, Megan P, DO  Active   ondansetron  (ZOFRAN -ODT) 4 MG disintegrating tablet [id number]  Take 1 tablet (4 mg total) by mouth every 8 (eight) hours as needed for nausea or vomiting.  Patient not taking: Reported on 07/07/2024   Vicci Duwaine SQUIBB, DO  Active   sacubitril -valsartan  (ENTRESTO ) 24-26 MG [id number]  Take 1 tablet by mouth 2 (two) times daily. Donette Ellouise LABOR, FNP  Active   Secukinumab (COSENTYX [place]) [id number]  Inject into  the skin every 30 (thirty) days. Last date give was the week of 11/25/22. Pt unsure of exact date. [provider]  Active            Med Note ZENA, CHERYL A   Wed Sep 24, 2023  8:40 AM) PAP  Semaglutide , 2 MG/DOSE, 8 MG/3ML SOPN [id number]  Inject 2 mg as directed once a week. Johnson, Megan P, DO  Active            Med Note (GENTRY, CHERYL A   Tue Apr 13, 2024 10:29 AM) Novo  spironolactone  (ALDACTONE ) 25 MG tablet [id number]  Take 1 tablet (25 mg total) by mouth daily. Johnson, Megan P, DO  Active   traZODone  (DESYREL ) 50 MG tablet [id number]  Take 1 tablet (50 mg total) by mouth at bedtime.  Patient taking differently: Take 50 mg by mouth at bedtime. prn   Vicci Bouchard P, DO  Active   triamcinolone  ointment (KENALOG ) 0.5 % [id number]  Apply 1 Application topically daily as needed. [provider]  Active   vitamin B-12 (CYANOCOBALAMIN) 1000 MCG tablet [id number]  Take 1,000 mcg by mouth daily. [provider]  Active             Recommendation:   -Patient will discuss back pain with PCP at 09/02/24 visit, discussed alternating heat/ice for 20 minutes 2-3 times a day -This RNCM called PCP office to request refills of Humulin R  be sent to [facility] due to no refills remaining.   Follow Up Plan:    Telephone follow-up in 1 month  Santana Stamp BSN, CCM [place]  VBCI Population Health RN Care Manager Direct Dial: [phone]  Fax: [phone]

## 2024-08-11 NOTE — Patient Instructions (Signed)
 Visit Information  Thank you for taking time to visit with me today. Please don't hesitate to contact me if I can be of assistance to you before our next scheduled appointment.  Your next care management appointment is by telephone on Wednesday, September 17th  at 2:00pm.   Please call the care guide team at [phone] if you need to cancel, schedule, or reschedule an appointment.   A reminder to ALL patients/family/friends, please call the [place]  National Suicide Prevention Lifeline: 2527026757 or TTY: 2197825894 TTY [phone]) to talk to a trained counselor if you are experiencing a Mental Health or Behavioral Health Crisis or need someone to talk to.  Santana Stamp BSN, CCM [place]  VBCI Population Health RN Care Manager Direct Dial: [phone]  Fax: [phone]

## 2024-08-12 NOTE — Telephone Encounter (Signed)
 Duplicate request, LRF 08/09/24.  Requested Prescriptions  Pending Prescriptions Disp Refills   insulin  regular (HUMULIN R ) 100 units/mL injection 10 mL 0     Endocrinology:  Diabetes - Insulins Passed - 08/12/2024  4:06 PM      Passed - HBA1C is between 0 and 7.9 and within 180 days    Hemoglobin A1C  Date Value Ref Range Status  04/24/2023 7.9  Final   HB A1C (BAYER DCA - WAIVED)  Date Value Ref Range Status  06/08/2024 7.8 (H) 4.8 - 5.6 % Final    Comment:             Prediabetes: 5.7 - 6.4          Diabetes: >6.4          Glycemic control for adults with diabetes: <7.0          Passed - Valid encounter within last 6 months    Recent Outpatient Visits           1 month ago Type 2 diabetes mellitus with cardiac complication [facility])   [place] [facility] [place], Megan P, DO   2 months ago Type 2 diabetes mellitus with cardiac complication [facility])   [place] [facility] [place], Megan P, DO   3 months ago Type 2 diabetes mellitus with cardiac complication [facility])   [place] [facility] [place], Megan P, DO   4 months ago Type 2 diabetes mellitus with cardiac complication [facility])   [place] [facility] [place], Megan P, DO   5 months ago Type 2 diabetes mellitus with cardiac complication [facility])   [place] [place], Megan P, DO

## 2024-08-16 ENCOUNTER — Ambulatory Visit: Payer: Self-pay

## 2024-08-16 NOTE — Telephone Encounter (Signed)
 FYI Only or Action Required?: Action required by provider: request for appointment.  Patient was last seen in primary care on 06/23/2024 by Vicci Bouchard P, DO.  Called Nurse Triage reporting Covid Positive.  Symptoms began several days ago.  Interventions attempted: Rest, hydration, or home remedies.  Symptoms are: gradually worsening.  Triage Disposition: Go to ED or PCP/Alternative with Approval-would rather be seen in the office. Asking for an office appointment.   Patient/caregiver understands and will follow disposition?: No, refuses disposition  Copied from CRM [id number]. Topic: Clinical - Red Word Triage >> Aug 16, 2024 11:09 AM Nathanel BROCKS wrote: Red Word that prompted transfer to Nurse Triage:   Pt has tested positive for covid. She started getting sick on Friday. Fever and just doesn't feel good. Reason for Disposition  Patient sounds very sick or weak to the triager  Answer Assessment - Initial Assessment Questions 1. COVID-19 DIAGNOSIS: How do you know that you have COVID? (e.g., positive lab test or self-test, diagnosed by doctor or NP/PA, symptoms after exposure).     Positive home test yesterday. 2. COVID-19 EXPOSURE: Was there any known exposure to COVID before the symptoms began? CDC Definition of close contact: within 6 feet (2 meters) for a total of 15 minutes or more over a 24-hour period.      No known exposure 3. ONSET: When did the COVID-19 symptoms start?      Started getting sick on Friday 4. WORST SYMPTOM: What is your worst symptom? (e.g., cough, fever, shortness of breath, muscle aches)     Shortness of breath, congestion 5. COUGH: Do you have a cough? If Yes, ask: How bad is the cough?       Slight cough 6. FEVER: Do you have a fever? If Yes, ask: What is your temperature, how was it measured, and when did it start?     No measurable fever.  7. RESPIRATORY STATUS: Describe your breathing? (e.g., normal; shortness of breath, wheezing,  unable to speak)      Shortness of breath 8. BETTER-SAME-WORSE: Are you getting better, staying the same or getting worse compared to yesterday?  If getting worse, ask, In what way?     Getting worse 9. OTHER SYMPTOMS: Do you have any other symptoms?  (e.g., chills, fatigue, headache, loss of smell or taste, muscle pain, sore throat)     Fatigue, chills, sore throat 10. HIGH RISK DISEASE: Do you have any chronic medical problems? (e.g., asthma, heart or lung disease, weak immune system, obesity, etc.)       DM, lung disease 11. VACCINE: Have you had the COVID-19 vaccine? If Yes, ask: Which one, how many shots, when did you get it?       Niece calling for patient-unsure if she has had all the vaccines for COVID 13. O2 SATURATION MONITOR:  Do you use an oxygen  saturation monitor (pulse oximeter) at home? If Yes, ask What is your reading (oxygen  level) today? What is your usual oxygen  saturation reading? (e.g., 95%)       NA  Protocols used: Coronavirus (COVID-19) Diagnosed or Suspected-A-AH

## 2024-08-17 ENCOUNTER — Encounter: Payer: Self-pay | Admitting: Nurse Practitioner

## 2024-08-17 ENCOUNTER — Ambulatory Visit (INDEPENDENT_AMBULATORY_CARE_PROVIDER_SITE_OTHER): Admitting: Nurse Practitioner

## 2024-08-17 ENCOUNTER — Ambulatory Visit
Admission: RE | Admit: 2024-08-17 | Discharge: 2024-08-17 | Disposition: A | Discharge status: Home / Self Care | Source: Ambulatory Visit | Attending: Nurse Practitioner | Admitting: Nurse Practitioner

## 2024-08-17 VITALS — BP 126/59 | HR 62 | Temp 98.4°F | Resp 16 | Ht 60.0 in | Wt 188.0 lb

## 2024-08-17 DIAGNOSIS — U071 COVID-19: Secondary | ICD-10-CM

## 2024-08-17 DIAGNOSIS — Z87891 Personal history of nicotine dependence: Secondary | ICD-10-CM | POA: Diagnosis not present

## 2024-08-17 DIAGNOSIS — R5383 Other fatigue: Secondary | ICD-10-CM | POA: Diagnosis not present

## 2024-08-17 DIAGNOSIS — R0989 Other specified symptoms and signs involving the circulatory and respiratory systems: Secondary | ICD-10-CM | POA: Diagnosis not present

## 2024-08-17 NOTE — Telephone Encounter (Signed)
Scheduled for this morning

## 2024-08-17 NOTE — Progress Notes (Signed)
 BP (!) 126/59 (BP Location: Left Arm, Patient Position: Sitting, Cuff Size: Large)   Pulse 62   Temp 98.4 F (36.9 C) (Oral)   Resp 16   Ht 5' (1.524 m)   Wt 188 lb (85.3 kg)   SpO2 90%   BMI 36.72 kg/m    Subjective:    Patient ID: Kelsey Mcdowell, female    DOB: 1946/01/06, 78 y.o.   MRN: [id number]  HPI: Kelsey Mcdowell is a 79 y.o. female  Chief Complaint  Patient presents with   Covid Positive    1 home test + the other did not read.    Cough   UPPER RESPIRATORY TRACT INFECTION Worst symptom: symptoms started on Friday Fever: no Cough: no Shortness of breath: no Wheezing: no Chest pain: no Chest tightness: no Chest congestion: yes Nasal congestion: yes Runny nose: no Post nasal drip: no Sneezing: no Sore throat: no Swollen glands: no Sinus pressure: no Headache: no Face pain: no Toothache: no Ear pain: no bilateral Ear pressure: no bilateral Eyes red/itching:no Eye drainage/crusting: no  Vomiting: no Rash: no Fatigue: no Sick contacts: no Strep contacts: no  Context: better Recurrent sinusitis: no Relief with OTC cold/cough medications: no  Treatments attempted: none   Relevant past medical, surgical, family and social history reviewed and updated as indicated. Interim medical history since our last visit reviewed. Allergies and medications reviewed and updated.  Review of Systems  Constitutional:  Negative for fatigue and fever.  HENT:  Positive for congestion. Negative for dental problem, ear pain, postnasal drip, rhinorrhea, sinus pressure, sinus pain, sneezing and sore throat.   Respiratory:  Negative for cough, shortness of breath and wheezing.        Chest congestion  Cardiovascular:  Negative for chest pain.  Gastrointestinal:  Negative for vomiting.  Skin:  Negative for rash.  Neurological:  Negative for headaches.    Per HPI unless specifically indicated above     Objective:    BP (!) 126/59 (BP Location:  Left Arm, Patient Position: Sitting, Cuff Size: Large)   Pulse 62   Temp 98.4 F (36.9 C) (Oral)   Resp 16   Ht 5' (1.524 m)   Wt 188 lb (85.3 kg)   SpO2 90%   BMI 36.72 kg/m   Wt Readings from Last 3 Encounters:  08/17/24 188 lb (85.3 kg)  08/10/24 189 lb (85.7 kg)  06/23/24 196 lb (88.9 kg)    Physical Exam Vitals and nursing note reviewed.  Constitutional:      General: She is not in acute distress.    Appearance: Normal appearance. She is normal weight. She is not ill-appearing, toxic-appearing or diaphoretic.  HENT:     Head: Normocephalic.     Right Ear: Tympanic membrane and external ear normal.     Left Ear: Tympanic membrane and external ear normal.     Nose: Congestion and rhinorrhea present.     Mouth/Throat:     Mouth: Mucous membranes are moist.     Pharynx: Oropharynx is clear. Posterior oropharyngeal erythema present. No oropharyngeal exudate.  Eyes:     General:        Right eye: No discharge.        Left eye: No discharge.     Extraocular Movements: Extraocular movements intact.     Conjunctiva/sclera: Conjunctivae normal.     Pupils: Pupils are equal, round, and reactive to light.  Cardiovascular:     Rate and Rhythm: Normal rate and  regular rhythm.     Heart sounds: No murmur heard. Pulmonary:     Effort: Pulmonary effort is normal. No respiratory distress.     Breath sounds: Normal breath sounds. No wheezing or rales.  Musculoskeletal:     Cervical back: Normal range of motion and neck supple.  Skin:    General: Skin is warm and dry.     Capillary Refill: Capillary refill takes less than 2 seconds.  Neurological:     General: No focal deficit present.     Mental Status: She is alert and oriented to person, place, and time. Mental status is at baseline.  Psychiatric:        Mood and Affect: Mood normal.        Behavior: Behavior normal.        Thought Content: Thought content normal.        Judgment: Judgment normal.     Results for orders  placed or performed in visit on 08/10/24  Surgical pathology   Collection Time: 08/10/24 12:00 AM  Result Value Ref Range   SURGICAL PATHOLOGY      SURGICAL PATHOLOGY [facility] [address] [place], [place] [address] Telephone [phone] or [phone] Fax [phone]  REPORT OF SURGICAL PATHOLOGY   Accession #: [id number] Patient Name: Kelsey Mcdowell Visit # :   MRN: [id number] Physician: Maryruth Smalls DOB/Age [birth year] (Age: 40) Gender: F Collected Date: 08/10/2024 Received Date: 08/10/2024  FINAL DIAGNOSIS       1. Ascending  Colon Polyp, cold snare x5 :       - TUBULAR ADENOMA (5).      - NEGATIVE FOR HIGH-GRADE DYSPLASIA AND MALIGNANCY.       2. Cecum Polyp, cold snare x2 :       - TUBULAR ADENOMA (2).      - NEGATIVE FOR HIGH-GRADE DYSPLASIA AND MALIGNANCY.       3. Transverse Colon Polyp, cold snare x3 :       - TUBULAR ADENOMA (3).      - NEGATIVE FOR HIGH-GRADE DYSPLASIA AND MALIGNANCY.       4. Sigmoid  Colon Polyp, cold snare :       - TUBULAR ADENOMA.      - NEGATIVE FOR HIGH-GRADE DYSPLASIA AND MALIGNANCY.       ELECTRONIC SIGNAT URE : Rub[place] Md, [place] , Sports administrator, International aid/development worker  MICROSCOPIC DESCRIPTION  CASE COMMENTS STAINS USED IN DIAGNOSIS: H&E H&E H&E H&E H&E H&E H&E    CLINICAL HISTORY  SPECIMEN(S) OBTAINED 1. Ascending  Colon Polyp, Cold Snare X5 2. Cecum Polyp, Cold Snare X2 3. Transverse Colon Polyp, Cold Snare X3 4. Sigmoid  Colon Polyp, Cold Snare  SPECIMEN COMMENTS: SPECIMEN CLINICAL INFORMATION: 1. Polyps    Gross Description 1. Received in formalin are tan, soft tissue fragments that are submitted in toto.Number:  multiple ,  Size:  0.3 cm smallest to 0.8 cm largest, 3 blocks. 2. Received in formalin are tan, soft tissue fragments that are submitted in toto.Number:  three,  Size:  0.7 cm,  1 block 3. Received in formalin are tan, soft tissue fragments  that are submitted in toto.Number:  multiple,  Size:  0.2 cm smallest to 0.9 cm largest, 2 blocks 4. Received in formalin are tan, soft tissue fragments that are submitted in toto.Number:  two  Size:  0.9  cm, 1 block.mb 08-10-24        Report signed out from the  following location(s) [place]. [facility] [address] [place], [place] [address] CLIA #: [id number]  [facility] [address] [place], [place] [address] CLIA #: [id number]       Assessment & Plan:   Problem List Items Addressed This Visit   None Visit Diagnoses       COVID-19    -  Primary   COVID positive in office. GFR of 28- and symptoms are improving will hold off on Paxlovid. Chest xray ordered due to O2 in low 90s.  Will await results.   Relevant Orders   DG Chest 2 View        Follow up plan: Return if symptoms worsen or fail to improve.

## 2024-08-18 ENCOUNTER — Other Ambulatory Visit: Payer: Self-pay | Admitting: Family Medicine

## 2024-08-20 NOTE — Telephone Encounter (Signed)
 Requested Prescriptions  Pending Prescriptions Disp Refills   OneTouch UltraSoft 2 Lancets MISC [Pharmacy Med Name: LANCET   ONE TCH ULTRA SOFT 2] 100 each 2    Sig: USE AS INSTRUCTED TO TEST BLOOD  SUGAR DAILY AT 2 PM     Endocrinology: Diabetes - Testing Supplies Passed - 08/20/2024  1:40 PM      Passed - Valid encounter within last 12 months    Recent Outpatient Visits           3 days ago COVID-19   [place] [facility] [place], Darice, NP   1 month ago Type 2 diabetes mellitus with cardiac complication [facility])   [place] [facility] [place], Megan P, DO   2 months ago Type 2 diabetes mellitus with cardiac complication [facility])   [place] [facility] [place], Megan P, DO   3 months ago Type 2 diabetes mellitus with cardiac complication [facility])   [place] [facility] [place], Megan P, DO   4 months ago Type 2 diabetes mellitus with cardiac complication [facility])   [place] [facility] [place], Megan P, DO

## 2024-08-27 ENCOUNTER — Ambulatory Visit: Payer: Self-pay | Admitting: Nurse Practitioner

## 2024-08-31 ENCOUNTER — Other Ambulatory Visit: Payer: Self-pay

## 2024-08-31 DIAGNOSIS — E1159 Type 2 diabetes mellitus with other circulatory complications: Secondary | ICD-10-CM

## 2024-08-31 DIAGNOSIS — E113313 Type 2 diabetes mellitus with moderate nonproliferative diabetic retinopathy with macular edema, bilateral: Secondary | ICD-10-CM | POA: Diagnosis not present

## 2024-08-31 NOTE — Progress Notes (Signed)
   08/31/2024  Patient ID: Kelsey Mcdowell, female   DOB: 1946-06-30, 78 y.o.   MRN: [id number]  Subjective/objective Telephone visit to follow-up on management of type 2 diabetes   Diabetes management plan -Current medications: Farxiga  10 mg daily, Ozempic  2mg  weekly, Lantus  30 units HS, Humulin R  10-15 units TID with meals -Patient does monitor home blood glucose and endorses FBG 90-130 and post-prandial 180-200 -Patient does not endorse any signs or symptoms of hypoglycemia -Patient states days that she has to get out for something she may not use Humulin TID, but once or twice instead -She also endorses days when she is at home snacking quite a bit evening BG will be >200 -Last A1c was 7.8% in June -Receives Farxiga  through AZ&Me PAP and Ozempic  through Novo PAP- patient also enrolled in [employer] Medicare Extra Help -Currently has at least 3 boxes of Ozempic  2mg  on hand   Assessment/plan   Diabetes management plan -Continue current regimen at this time  -Patient sees PCP again 9/11 and will be due for A1c  -If A1c continues to be >7%, consider increasing total daily insulin  dose by 10%- Lantus  33 untis, Humulin 11-16 units TID with meals -Continue regular monitoring of home blood glucose -Advised patient to inform me when down to last Ozempic  pen, so we can work on reorder form since Novo PAP is no longer doing automatic refills  Follow-up: 3 months   Channing DELENA Mealing, PharmD, DPLA

## 2024-09-02 ENCOUNTER — Ambulatory Visit: Admitting: Family Medicine

## 2024-09-03 ENCOUNTER — Other Ambulatory Visit: Payer: Self-pay | Admitting: Family Medicine

## 2024-09-06 NOTE — Telephone Encounter (Signed)
 Requested Prescriptions  Pending Prescriptions Disp Refills   nortriptyline  (PAMELOR ) 50 MG capsule [Pharmacy Med Name: Nortriptyline  HCl 50 MG Oral Capsule] 30 capsule 11    Sig: TAKE 1 CAPSULE BY MOUTH ONCE  DAILY     Psychiatry:  Antidepressants - Heterocyclics (TCAs) Passed - 09/06/2024  1:10 PM      Passed - Completed PHQ-2 or PHQ-9 in the last 360 days      Passed - Valid encounter within last 6 months    Recent Outpatient Visits           2 weeks ago COVID-19   [place] [facility] [place], Darice, NP   2 months ago Type 2 diabetes mellitus with cardiac complication [facility])   [place] [facility] [place], Megan P, DO   3 months ago Type 2 diabetes mellitus with cardiac complication [facility])   [place] [facility] [place], Megan P, DO   4 months ago Type 2 diabetes mellitus with cardiac complication [facility])   [place] [facility] [place], Megan P, DO   4 months ago Type 2 diabetes mellitus with cardiac complication [facility])    [facility] [place] [place], Megan P, DO

## 2024-09-08 ENCOUNTER — Other Ambulatory Visit: Payer: Self-pay

## 2024-09-08 NOTE — Patient Outreach (Signed)
 Complex Care Management   Visit Note  09/08/2024  Name:  Kelsey Mcdowell MRN: [id number] DOB: May 02, 1946  Situation: Referral received for Complex Care Management related to DM I obtained verbal consent from Patient.  Visit completed with Patient  on the phone  Background:   Past Medical History:  Diagnosis Date   Arthritis    CHF (congestive heart failure) (HCC)    COPD (chronic obstructive pulmonary disease) (HCC)    Diabetes mellitus without complication (HCC)    GERD (gastroesophageal reflux disease)    Hyperlipemia    Hypertension    Myocardial infarction (HCC)    light heart attack ?when   Neuropathy    Sleep apnea     Assessment: Patient Reported Symptoms:  Cognitive Cognitive Status: Alert and oriented to person, place, and time, Normal speech and language skills, Insightful and able to interpret abstract concepts      Neurological Neurological Review of Symptoms: No symptoms reported    HEENT HEENT Symptoms Reported: No symptoms reported      Cardiovascular Cardiovascular Symptoms Reported: No symptoms reported    Respiratory Respiratory Symptoms Reported: No symptoms reported Additional Respiratory Details: Continues to be smoke free    Endocrine Is patient diabetic?: Yes Is patient checking blood sugars at home?: Yes List most recent blood sugar readings, include date and time of day: Last readings: 9/14 AM 145 FBG, PM 114. Endocrine Self-Management Outcome: 3 (uncertain)  Gastrointestinal Gastrointestinal Symptoms Reported: No symptoms reported      Genitourinary Genitourinary Symptoms Reported: Not assessed    Integumentary Integumentary Symptoms Reported: Not assessed    Musculoskeletal Musculoskelatal Symptoms Reviewed: Not assessed        Psychosocial Psychosocial Symptoms Reported: Not assessed          09/08/2024    PHQ2-9 Depression Screening   Little interest or pleasure in doing things    Feeling down, depressed, or  hopeless    PHQ-2 - Total Score    Trouble falling or staying asleep, or sleeping too much    Feeling tired or having little energy    Poor appetite or overeating     Feeling bad about yourself - or that you are a failure or have let yourself or your family down    Trouble concentrating on things, such as reading the newspaper or watching television    Moving or speaking so slowly that other people could have noticed.  Or the opposite - being so fidgety or restless that you have been moving around a lot more than usual    Thoughts that you would be better off dead, or hurting yourself in some way    PHQ2-9 Total Score    If you checked off any problems, how difficult have these problems made it for you to do your work, take care of things at home, or get along with other people    Depression Interventions/Treatment      There were no vitals filed for this visit.  Medications Reviewed Today   Medications were not reviewed in this encounter     Recommendation:   Specialty provider follow-up Cardiology 10/13/24, Nephrology 10/27/24 Continue Current Plan of Care  Follow Up Plan:   Telephone follow-up two weeks.  Santana Stamp BSN, CCM [place]  VBCI Population Health RN Care Manager Direct Dial: [phone]  Fax: [phone]

## 2024-09-08 NOTE — Patient Instructions (Signed)
 Visit Information  Thank you for taking time to visit with me today. Please don't hesitate to contact me if I can be of assistance to you before our next scheduled appointment.  Your next care management appointment is by telephone on 09/09/24 at 12:30   Please call the care guide team at [phone] if you need to cancel, schedule, or reschedule an appointment.   A reminder to ALL patients/family/friends, please call the [place]  National Suicide Prevention Lifeline: (408)110-7437 or TTY: 337-175-9395 TTY [phone]) to talk to a trained counselor if you are experiencing a Mental Health or Behavioral Health Crisis or need someone to talk to.  Santana Stamp BSN, CCM [facility]  VBCI Population Health RN Care Manager Direct Dial: [phone]  Fax: [phone]

## 2024-09-09 ENCOUNTER — Encounter: Payer: Self-pay | Admitting: Family Medicine

## 2024-09-13 DIAGNOSIS — J449 Chronic obstructive pulmonary disease, unspecified: Secondary | ICD-10-CM | POA: Diagnosis not present

## 2024-09-27 ENCOUNTER — Other Ambulatory Visit: Payer: Self-pay | Admitting: Family Medicine

## 2024-09-28 ENCOUNTER — Other Ambulatory Visit: Payer: Self-pay

## 2024-09-28 DIAGNOSIS — E113313 Type 2 diabetes mellitus with moderate nonproliferative diabetic retinopathy with macular edema, bilateral: Secondary | ICD-10-CM | POA: Diagnosis not present

## 2024-09-28 LAB — OPHTHALMOLOGY REPORT-SCANNED

## 2024-09-28 NOTE — Telephone Encounter (Signed)
 Requested Prescriptions  Pending Prescriptions Disp Refills   insulin  regular (HUMULIN R ) 100 units/mL injection [Pharmacy Med Name: HumuLIN R  100 UNIT/ML Injection Solution] 10 mL 3    Sig: INJECT SUBCUTANEOUSLY 10 TO 15  UNITS 3 TIMES DAILY BEFORE MEALS     Endocrinology:  Diabetes - Insulins Passed - 09/28/2024  5:14 PM      Passed - HBA1C is between 0 and 7.9 and within 180 days    Hemoglobin A1C  Date Value Ref Range Status  04/24/2023 7.9  Final   HB A1C (BAYER DCA - WAIVED)  Date Value Ref Range Status  06/08/2024 7.8 (H) 4.8 - 5.6 % Final    Comment:             Prediabetes: 5.7 - 6.4          Diabetes: >6.4          Glycemic control for adults with diabetes: <7.0          Passed - Valid encounter within last 6 months    Recent Outpatient Visits           1 month ago COVID-19   [facility] [place], Darice, NP   3 months ago Type 2 diabetes mellitus with cardiac complication [facility]   [place] [facility] [place], Megan P, DO   3 months ago Type 2 diabetes mellitus with cardiac complication [facility])   [facility] [place], Megan P, DO   4 months ago Type 2 diabetes mellitus with cardiac complication [facility])   [facility] [place], Megan P, DO   5 months ago Type 2 diabetes mellitus with cardiac complication [facility])   [place] [facility] [place], Megan P, DO

## 2024-09-29 ENCOUNTER — Telehealth: Payer: Self-pay

## 2024-09-29 ENCOUNTER — Other Ambulatory Visit: Payer: Self-pay

## 2024-09-29 NOTE — Progress Notes (Unsigned)
   09/29/2024  Patient ID: Kelsey Mcdowell, female   DOB: Nov 26, 1946, 78 y.o.   MRN: [id number]  Received a message from [facility] stating patient is out of Lantus  and Humulin R .  [facility] was able to refill Humulin R  and has marked the order as urgent, but a prescription is needed for them to be able to refill Lantus .  Patient was last seen by PCP in July and missed her last 2 scheduled visits.  Order pending for a 1 month supply of Lantus  for Dr. Vicci to sign if in agreement.  Once signed, I will contact [facility] to request this order also be marked as urgent and inform patient a follow-up with Dr. Vicci will be needed before additional refills can be sent.  Channing DELENA Mealing, PharmD, DPLA

## 2024-09-29 NOTE — Patient Outreach (Unsigned)
 Complex Care Management   Visit Note  09/29/2024  Name:  Kelsey Mcdowell MRN: [id number] DOB: [birth year]  Situation: Referral received for Complex Care Management related to {Criteria:32550} I obtained verbal consent from {CHL AMB Patient/Caregiver:28184}.  Visit completed with {CHL AMB Patient/Caregiver:28184}  {VISIT LOCATION:32553}  Background:   Past Medical History:  Diagnosis Date   Arthritis    CHF (congestive heart failure) (HCC)    COPD (chronic obstructive pulmonary disease) (HCC)    Diabetes mellitus without complication (HCC)    GERD (gastroesophageal reflux disease)    Hyperlipemia    Hypertension    Myocardial infarction (HCC)    light heart attack ?when   Neuropathy    Sleep apnea     Assessment: Patient Reported Symptoms:  Cognitive Cognitive Status: Alert and oriented to person, place, and time, Insightful and able to interpret abstract concepts, Normal speech and language skills      Neurological Neurological Review of Symptoms: No symptoms reported    HEENT HEENT Symptoms Reported: No symptoms reported (Goes to [facility] every month, last appt was 09/28/24)      Cardiovascular Cardiovascular Symptoms Reported: No symptoms reported    Respiratory Respiratory Symptoms Reported: No symptoms reported    Endocrine Is patient diabetic?: Yes Is patient checking blood sugars at home?: Yes List most recent blood sugar readings, include date and time of day: 09/29/24: FBG 125mg /dL Endocrine Comment: Does not have enough insulin  to take for tonight.  Gastrointestinal Gastrointestinal Symptoms Reported: Constipation Additional Gastrointestinal Details: She will take some Mirlax to try and get her bowels moving.      Genitourinary Genitourinary Symptoms Reported: No symptoms reported (Reports she gets a strong odor from her urine, foamy, been going on for a couple of months.)    Integumentary Integumentary Symptoms Reported: No symptoms reported     Musculoskeletal Musculoskelatal Symptoms Reviewed: No symptoms reported   Falls in the past year?: No    Psychosocial Psychosocial Symptoms Reported: Not assessed          09/29/2024    PHQ2-9 Depression Screening   Little interest or pleasure in doing things    Feeling down, depressed, or hopeless    PHQ-2 - Total Score    Trouble falling or staying asleep, or sleeping too much    Feeling tired or having little energy    Poor appetite or overeating     Feeling bad about yourself - or that you are a failure or have let yourself or your family down    Trouble concentrating on things, such as reading the newspaper or watching television    Moving or speaking so slowly that other people could have noticed.  Or the opposite - being so fidgety or restless that you have been moving around a lot more than usual    Thoughts that you would be better off dead, or hurting yourself in some way    PHQ2-9 Total Score    If you checked off any problems, how difficult have these problems made it for you to do your work, take care of things at home, or get along with other people    Depression Interventions/Treatment      There were no vitals filed for this visit.  Medications Reviewed Today   Medications were not reviewed in this encounter     Recommendation:   {RECOMMENDATONS:32554}  Follow Up Plan:   {FOLLOWUP:32559}  SIG ***

## 2024-09-30 ENCOUNTER — Telehealth: Payer: Self-pay

## 2024-09-30 MED ORDER — INSULIN GLARGINE 100 UNIT/ML SOLOSTAR PEN: 30.0000 [IU] | PEN_INJECTOR | Freq: Every day | SUBCUTANEOUS | 0 refills | Status: DC

## 2024-09-30 NOTE — Progress Notes (Signed)
   09/30/2024  Patient ID: Kelsey Mcdowell, female   DOB: May 23, 1946, 78 y.o.   MRN: [id number]  Contacted [facility] to verify receipt of Lantus  sent yesterday.  Pharmacist states this order was received and has been marked as urgent.  Lantus  along with Humulin R  should be delivered to patient tomorrow.  Contacted patient to make her aware and advise a follow-up with Dr. Vicci needs to be scheduled, and she plans to reach out to the office to take care of this.  I have her down for a telephone follow-up with me again 12/3.  Channing DELENA Mealing, PharmD, DPLA

## 2024-09-30 NOTE — Patient Outreach (Signed)
 On 09/29/24, contacted Channing Mealing, [facility].  Informed patient is out of insulin , this RNCM performed conference call with patient and [facility], rep states Humulin is being shipped, patient requested a RUSH on the package, rep states they are still waiting for PCP approval on the Lantus .  Channing will get PCP approval and call [facility] to rush Lantus .  Patient informed of Cheryl's intervention.

## 2024-09-30 NOTE — Patient Instructions (Signed)
 Visit Information  Thank you for taking time to visit with me today. Please don't hesitate to contact me if I can be of assistance to you before our next scheduled appointment.  Your next care management appointment is by telephone in 1-2 days to check on insulin  delivery.  Please call the care guide team at [phone] if you need to cancel, schedule, or reschedule an appointment.   A reminder to ALL patients/family/friends, please call the [place]  National Suicide Prevention Lifeline: 7153393912 or TTY: [phone] TTY ([phone]) to talk to a trained counselor if you are experiencing a Mental Health or Behavioral Health Crisis or need someone to talk to.  Santana Stamp BSN, CCM [place]  VBCI Population Health RN Care Manager Direct Dial: [phone]  Fax: [phone]

## 2024-10-08 ENCOUNTER — Telehealth: Payer: Self-pay

## 2024-10-08 NOTE — Telephone Encounter (Signed)
 FYI  Copied from CRM [id number]. Topic: Appointments - Transfer of Care >> Oct 08, 2024 10:19 AM Rea ORN wrote: Pt is requesting to transfer FROM: Dr.Johnson Pt is requesting to transfer TO: Angeline Laura Reason for requested transfer: Niece referred pt to Angeline It is the responsibility of the team the patient would like to transfer to [facility]) to reach out to the patient if for any reason this transfer is not acceptable.

## 2024-10-08 NOTE — Telephone Encounter (Signed)
 I have no issue with this as long as Angeline is OK with it

## 2024-10-08 NOTE — Telephone Encounter (Signed)
 LM for patient to call back and schedule an appointment with Angeline in next new patient slot. Okay to schedule if patient calls back. Thanks!

## 2024-10-08 NOTE — Telephone Encounter (Signed)
 Fine with me, next available new patient appointment

## 2024-10-10 ENCOUNTER — Other Ambulatory Visit: Payer: Self-pay | Admitting: Family Medicine

## 2024-10-12 ENCOUNTER — Telehealth: Payer: Self-pay | Admitting: Family

## 2024-10-12 DIAGNOSIS — J449 Chronic obstructive pulmonary disease, unspecified: Secondary | ICD-10-CM | POA: Diagnosis not present

## 2024-10-12 NOTE — Telephone Encounter (Signed)
 Called to confirm/remind patient of their appointment at the [facility] on 10/13/24.   Appointment:   [x] Confirmed  [] Left mess   [] No answer/No voice mail  [] VM Full/unable to leave message  [] Phone not in service  Patient reminded to bring all medications and/or complete list.  Confirmed patient has transportation. Gave directions, instructed to utilize valet parking.

## 2024-10-12 NOTE — Progress Notes (Unsigned)
 [facility] Note    PCP: Vicci Bouchard, DO (last seen 04/25) Primary Cardiologist: Clarisa Kung PA (last seen 01/25)  Chief Complaint: shortness of breath   HPI:  Kelsey Mcdowell is a 78 yo with a PMHx of COPD, acute on chronic systolic CHF, DM II, tobacco abuse, obesity, and HTN.  Admitted to [facility] 01/27/20[facility], treated for acute hypoxic hypercapnic respiratory failure requiring BiPAP, treated with IV Lasix  and IV Solu-Medrol . She was discharged on supplemental oxygen  and Lasix  20 mg. Beta-blocker was deferred due to bronchospasm initially. She was discharged on lisinopril. She was also noted to be anemic with hemoglobin 8.9 and received IV Venofer . Chest x-ray showed cardiomegaly with bilateral airspace disease, likely edema/CHF with no effusions. 2D echocardiogram on 01/28/2022 revealed mildly reduced left ventricular function with LVEF 40 to 45%. There were no previous echocardiograms for comparison.   Lexiscan Myoview was performed 03/07/2022. Gated scintigraphy revealed LV ejection fraction of 33%. SPECT analysis revealed a mild anterior scar mixed with minimal ischemia.   Admitted 04/24/23 due to several days of diarrhea, decreased PO intake and worsening weakness. Found to have oliguric AKI. Held furosemide , aldactone , entresto , hydrochlorothiazide, dapagliflozin  during hospital stay.   Echo 08/20/23: EF 60-65% with mild LVH, Grade I DD  Seen in the HF Clinic 02/17/24 and had spironolactone  increased to 25mg  daily.   She presents today for a HF follow-up visit with a chief complaint of shortness of breath when walking long distances. Has associated fatigue, pedal edema & peripheral neuropathy along with this. Denies chest pain, cough, palpitations, abdominal distention, dizziness or difficulty sleeping. Has not smoked anything in the last month. Is finishing up the 14mg  nicoderm patch and then will transition down to the 7mg  patch. Has zip up compression socks but hasn't  started wearing them yet.   Took atorvastatin in the past and it made her neuropathy worse. Has rosuvastatin  at home but hasn't started taking it yet because she was concerned about it making her neuropathy worse.    ROS: All systems negative except as listed in HPI, PMH and Problem List.  SH:  Social History   Socioeconomic History   Marital status: Widowed    Spouse name: Not on file   Number of children: 0   Years of education: Not on file   Highest education level: Not on file  Occupational History   Not on file  Tobacco Use   Smoking status: Every Day    Current packs/day: 0.50    Average packs/day: 1.5 packs/day for 61.6 years (91.9 ttl pk-yrs)    Types: Cigarettes    Start date: 1964    Last attempt to quit: 02/01/2024   Smokeless tobacco: Never  Vaping Use   Vaping status: Never Used  Substance and Sexual Activity   Alcohol  use: Not Currently    Comment: occasional, last use 2024   Drug use: No   Sexual activity: Not Currently  Other Topics Concern   Not on file  Social History Narrative   Not on file   Social Drivers of Health   Financial Resource Strain: Low Risk  (06/15/2024)   Overall Financial Resource Strain (CARDIA)    Difficulty of Paying Living Expenses: Not hard at all  Food Insecurity: No Food Insecurity (06/15/2024)   Hunger Vital Sign    Worried About Running Out of Food in the Last Year: Never true    Ran Out of Food in the Last Year: Never true  Transportation Needs: No Transportation Needs (  06/15/2024)   PRAPARE - Administrator, Civil Service (Medical): No    Lack of Transportation (Non-Medical): No  Physical Activity: Inactive (06/15/2024)   Exercise Vital Sign    Days of Exercise per Week: 0 days    Minutes of Exercise per Session: 0 min  Stress: Stress Concern Present (06/15/2024)   [employer] of Occupational Health - Occupational Stress Questionnaire    Feeling of Stress: To some extent  Social Connections: Moderately  Isolated (06/15/2024)   Social Connection and Isolation Panel    Frequency of Communication with Friends and Family: More than three times a week    Frequency of Social Gatherings with Friends and Family: More than three times a week    Attends Religious Services: More than 4 times per year    Active Member of [employer] or Organizations: No    Attends Banker Meetings: Never    Marital Status: Widowed  Intimate Partner Violence: Not At Risk (06/15/2024)   Humiliation, Afraid, Rape, and Kick questionnaire    Fear of Current or Ex-Partner: No    Emotionally Abused: No    Physically Abused: No    Sexually Abused: No    FH:  Family History  Problem Relation Age of Onset   Cancer Mother    Hypertension Mother    Diabetes Mother    Heart disease Father    Diabetes Father    Cancer Sister    Breast cancer Sister 35   Heart disease Brother     Past Medical History:  Diagnosis Date   Arthritis    CHF (congestive heart failure) (HCC)    COPD (chronic obstructive pulmonary disease) (HCC)    Diabetes mellitus without complication (HCC)    GERD (gastroesophageal reflux disease)    Hyperlipemia    Hypertension    Myocardial infarction (HCC)    light heart attack ?when   Neuropathy    Sleep apnea     Current Outpatient Medications  Medication Sig Dispense Refill   acetaminophen  (TYLENOL ) 500 MG tablet Take 500 mg by mouth every 6 (six) hours as needed (back pain).     Blood Glucose Monitoring Suppl (ACCU-CHEK GUIDE ME) w/Device KIT 1 each by Does not apply route daily. 1 kit 0   calcitRIOL (ROCALTROL) 0.25 MCG capsule Take 0.25 mcg by mouth daily. (Patient taking differently: Take 0.25 mcg by mouth daily.)     cholecalciferol (VITAMIN D3) 25 MCG (1000 UNIT) tablet Take 1,000 Units by mouth daily.     dapagliflozin  propanediol (FARXIGA ) 10 MG TABS tablet Take 1 tablet (10 mg total) by mouth daily before breakfast. 90 tablet 3   docusate sodium  (COLACE) 100 MG capsule Take  100 mg by mouth daily.     DULoxetine  (CYMBALTA ) 30 MG capsule Take 1 capsule (30 mg total) by mouth daily. 90 capsule 1   fluticasone  (FLONASE ) 50 MCG/ACT nasal spray Place 2 sprays into both nostrils daily. 16 g 12   gabapentin  (NEURONTIN ) 300 MG capsule Take 1 cap in the AM and 2 in the PM 270 capsule 1   glucose blood (ACCU-CHEK GUIDE TEST) test strip Use as instructed 100 each 12   insulin  glargine (LANTUS ) 100 UNIT/ML Solostar Pen Inject 30 Units into the skin at bedtime. 15 mL 0   insulin  regular (HUMULIN R ) 100 units/mL injection INJECT SUBCUTANEOUSLY 10 TO 15  UNITS 3 TIMES DAILY BEFORE MEALS 10 mL 2   metoprolol  succinate (TOPROL -XL) 25 MG 24 hr tablet Take  1 tablet (25 mg total) by mouth daily. 180 tablet 1   Multiple Vitamin (MULTIVITAMIN WITH MINERALS) TABS tablet Take 1 tablet by mouth daily.     nicotine  (NICODERM CQ  - DOSED IN MG/24 HR) 7 mg/24hr patch Place 1 patch (7 mg total) onto the skin daily. 28 patch 3   nortriptyline  (PAMELOR ) 50 MG capsule TAKE 1 CAPSULE BY MOUTH ONCE  DAILY 30 capsule 6   omeprazole  (PRILOSEC) 20 MG capsule Take 1 capsule (20 mg total) by mouth daily. 90 capsule 3   ondansetron  (ZOFRAN -ODT) 4 MG disintegrating tablet Take 1 tablet (4 mg total) by mouth every 8 (eight) hours as needed for nausea or vomiting. (Patient not taking: Reported on 07/07/2024) 60 tablet 2   OneTouch UltraSoft 2 Lancets MISC USE AS INSTRUCTED TO TEST BLOOD  SUGAR DAILY AT 2 PM 100 each 2   sacubitril -valsartan  (ENTRESTO ) 24-26 MG Take 1 tablet by mouth 2 (two) times daily. 180 tablet 3   Secukinumab (COSENTYX [place]) Inject into the skin every 30 (thirty) days. Last date give was the week of 11/25/22. Pt unsure of exact date.     Semaglutide , 2 MG/DOSE, 8 MG/3ML SOPN Inject 2 mg as directed once a week. 3 mL 1   spironolactone  (ALDACTONE ) 25 MG tablet Take 1 tablet (25 mg total) by mouth daily. 90 tablet 1   traZODone  (DESYREL ) 50 MG tablet Take 1 tablet (50 mg total) by mouth at  bedtime. (Patient taking differently: Take 50 mg by mouth at bedtime. prn) 90 tablet 1   triamcinolone  ointment (KENALOG ) 0.5 % Apply 1 Application topically daily as needed.     vitamin B-12 (CYANOCOBALAMIN) 1000 MCG tablet Take 1,000 mcg by mouth daily.     Current Facility-Administered Medications  Medication Dose Route Frequency Provider Last Rate Last Admin   NON FORMULARY   Other UD        There were no vitals filed for this visit.  Wt Readings from Last 3 Encounters:  08/17/24 188 lb (85.3 kg)  08/10/24 189 lb (85.7 kg)  06/23/24 196 lb (88.9 kg)   Lab Results  Component Value Date   CREATININE 1.68 (H) 04/13/2024   CREATININE 1.56 (H) 03/04/2024   CREATININE 1.38 (H) 10/14/2023    PHYSICAL EXAM:  General: Well appearing. No resp difficulty HEENT: normal Neck: supple, no JVD Cor: Regular rhythm, rate. No rubs, gallops or murmurs Lungs: clear Abdomen: soft, nontender, nondistended. Extremities: no cyanosis, clubbing, rash, 1+ pitting edema bilateral lower legs Neuro: alert & oriented X 3. Moves all 4 extremities w/o difficulty. Affect pleasant   ECG: not done   ASSESSMENT & PLAN:  NICM with preserved ejection fraction- - likely due to to HTN/ COPD - NYHA class II - euvolemic - weighing most days; reviewed the importance of weighing daily and to call for an overnight weight gain of > 2 pounds or a weekly weight gain of > 5 pounds - weight stable from last visit here 2 months ago - echo 01/28/22: EF 40-45% LV with mildly decreased function and global hypokinesis with mild LVH - echo 08/20/23: EF 60-65% with mild LVH, Grade I DD  - continue farxiga  10mg  daily - continue metoprolol  succinate 25mg  daily - continue entresto  24/26mg  BID - continue spironolactone  25mg  daily  - not adding salt to her food but has been eating out more often - has zip up compression socks that she's willing to try; explained to wear them during the day with removal at bedtime -  saw  cardiology Melva) 01/25 - encouraged as much activity as possible - BNP 01/27/22 721.9  Hypertension- - BP 136/68 - saw PCP Abe) 04/25 - saw nephrology Geoffry) 02/25 - BMP 03/04/24 reviewed: sodium 142, potassium 4.4, creatinine 1.56 and GFR 34 - BMET today  COPD- - saw pulmonologist Alica) 11/24 - she was unable to commit to pulmonary rehab because of her living 1/2 hour away from [facility] and gas is too expensive  DM- - A1c 03/04/24 was 7.7%  - ozempic  2mg  weekly; managed by PCP - saw nephrology Geoffry) 02/25  5: Tobacco use- - using nicotine  patch 14mg  daily; she is finishing the 14 mg patches and then is transitioning down to the 7mg  patch - no smoking for last 1 month  6: Hyperlipidemia- - lipid panel today to see what it looks like - she has rosuvastatin  10mg  at home and is willing to try it if needed; will call patient after we get results back   Return in 6 months, sooner if needed.   Ellouise DELENA Class, FNP 10/12/24

## 2024-10-12 NOTE — Telephone Encounter (Signed)
 Requested medication (s) are due for refill today: No  Requested medication (s) are on the active medication list: Yes  Last refill:  09/30/24  Future visit scheduled: Yes  Notes to clinic:  See request.    Requested Prescriptions  Pending Prescriptions Disp Refills   LANTUS  SOLOSTAR 100 UNIT/ML Solostar Pen [Pharmacy Med Name: Lantus  SoloStar 100 UNIT/ML Subcutaneous Solution Pen-injector] 15 mL 6    Sig: INJECT SUBCUTANEOUSLY 30 UNITS  AT BEDTIME     Endocrinology:  Diabetes - Insulins Passed - 10/12/2024  2:24 PM      Passed - HBA1C is between 0 and 7.9 and within 180 days    Hemoglobin A1C  Date Value Ref Range Status  04/24/2023 7.9  Final   HB A1C (BAYER DCA - WAIVED)  Date Value Ref Range Status  06/08/2024 7.8 (H) 4.8 - 5.6 % Final    Comment:             Prediabetes: 5.7 - 6.4          Diabetes: >6.4          Glycemic control for adults with diabetes: <7.0          Passed - Valid encounter within last 6 months    Recent Outpatient Visits           1 month ago COVID-19   [place] [facility] [place], Darice, NP   3 months ago Type 2 diabetes mellitus with cardiac complication [facility])   [place] [facility] [place], Megan P, DO   4 months ago Type 2 diabetes mellitus with cardiac complication [facility])   [place] [facility], Megan P, DO   5 months ago Type 2 diabetes mellitus with cardiac complication [facility])   [place] [facility] [place], Megan P, DO   6 months ago Type 2 diabetes mellitus with cardiac complication [facility])   [place] [place], Megan P, DO              Signed Prescriptions Disp Refills   nortriptyline  (PAMELOR ) 50 MG capsule 30 capsule 6    Sig: TAKE 1 CAPSULE BY MOUTH ONCE  DAILY     Psychiatry:  Antidepressants - Heterocyclics (TCAs) Passed - 10/12/2024  2:24 PM      Passed - Completed PHQ-2 or PHQ-9 in the last 360 days      Passed -  Valid encounter within last 6 months    Recent Outpatient Visits           1 month ago COVID-19   [place] [facility] [place], Darice, NP   3 months ago Type 2 diabetes mellitus with cardiac complication [facility])   [place] [facility] [place], Megan P, DO   4 months ago Type 2 diabetes mellitus with cardiac complication [facility])   [place] [facility] [place], Megan P, DO   5 months ago Type 2 diabetes mellitus with cardiac complication [facility])   [place] [facility] [place], Megan P, DO   6 months ago Type 2 diabetes mellitus with cardiac complication [facility])   [place] [facility] [place], Megan P, DO

## 2024-10-12 NOTE — Telephone Encounter (Signed)
 Requested Prescriptions  Pending Prescriptions Disp Refills   nortriptyline  (PAMELOR ) 50 MG capsule [Pharmacy Med Name: Nortriptyline  HCl 50 MG Oral Capsule] 30 capsule 6    Sig: TAKE 1 CAPSULE BY MOUTH ONCE  DAILY     Psychiatry:  Antidepressants - Heterocyclics (TCAs) Passed - 10/12/2024  2:24 PM      Passed - Completed PHQ-2 or PHQ-9 in the last 360 days      Passed - Valid encounter within last 6 months    Recent Outpatient Visits           1 month ago COVID-19   [place] [facility] Pa[facility], NP   3 months ago Type 2 diabetes mellitus with cardiac complication [facility])   [place] [facility] [place], Megan P, DO   4 months ago Type 2 diabetes mellitus with cardiac complication [facility])   [place] [facility] [place], Megan P, DO   5 months ago Type 2 diabetes mellitus with cardiac complication [facility])   [place] [facility] [place], Megan P, DO   6 months ago Type 2 diabetes mellitus with cardiac complication [facility])   [place] [facility], Megan P, DO               LANTUS  SOLOSTAR 100 UNIT/ML Solostar Pen [employer] Med Name: Lantus  SoloStar 100 UNIT/ML Subcutaneous Solution Pen-injector] 15 mL 6    Sig: INJECT SUBCUTANEOUSLY 30 UNITS  AT BEDTIME     Endocrinology:  Diabetes - Insulins Passed - 10/12/2024  2:24 PM      Passed - HBA1C is between 0 and 7.9 and within 180 days    Hemoglobin A1C  Date Value Ref Range Status  04/24/2023 7.9  Final   HB A1C (BAYER DCA - WAIVED)  Date Value Ref Range Status  06/08/2024 7.8 (H) 4.8 - 5.6 % Final    Comment:             Prediabetes: 5.7 - 6.4          Diabetes: >6.4          Glycemic control for adults with diabetes: <7.0          Passed - Valid encounter within last 6 months    Recent Outpatient Visits           1 month ago COVID-19   [place] [facility] [place], Pao, NP   3 months ago Type 2 diabetes  mellitus with cardiac complication [facility])   [place] [facility] [place], Megan P, DO   4 months ago Type 2 diabetes mellitus with cardiac complication [facility])   [place] [facility] [place], Megan P, DO   5 months ago Type 2 diabetes mellitus with cardiac complication [facility])   [facility] [place], Megan P, DO   6 months ago Type 2 diabetes mellitus with cardiac complication [facility])    [facility] [place], Megan P, DO

## 2024-10-13 ENCOUNTER — Encounter: Payer: Self-pay | Admitting: Family

## 2024-10-13 ENCOUNTER — Ambulatory Visit: Attending: Family | Admitting: Family

## 2024-10-13 VITALS — BP 147/78 | HR 84 | Wt 190.0 lb

## 2024-10-13 DIAGNOSIS — E669 Obesity, unspecified: Secondary | ICD-10-CM | POA: Diagnosis not present

## 2024-10-13 DIAGNOSIS — E785 Hyperlipidemia, unspecified: Secondary | ICD-10-CM | POA: Insufficient documentation

## 2024-10-13 DIAGNOSIS — E782 Mixed hyperlipidemia: Secondary | ICD-10-CM

## 2024-10-13 DIAGNOSIS — Z794 Long term (current) use of insulin: Secondary | ICD-10-CM | POA: Diagnosis not present

## 2024-10-13 DIAGNOSIS — J449 Chronic obstructive pulmonary disease, unspecified: Secondary | ICD-10-CM | POA: Insufficient documentation

## 2024-10-13 DIAGNOSIS — R531 Weakness: Secondary | ICD-10-CM | POA: Insufficient documentation

## 2024-10-13 DIAGNOSIS — Z720 Tobacco use: Secondary | ICD-10-CM | POA: Diagnosis not present

## 2024-10-13 DIAGNOSIS — R5383 Other fatigue: Secondary | ICD-10-CM | POA: Diagnosis not present

## 2024-10-13 DIAGNOSIS — F17210 Nicotine dependence, cigarettes, uncomplicated: Secondary | ICD-10-CM | POA: Diagnosis not present

## 2024-10-13 DIAGNOSIS — R0602 Shortness of breath: Secondary | ICD-10-CM | POA: Insufficient documentation

## 2024-10-13 DIAGNOSIS — I428 Other cardiomyopathies: Secondary | ICD-10-CM | POA: Insufficient documentation

## 2024-10-13 DIAGNOSIS — I5032 Chronic diastolic (congestive) heart failure: Secondary | ICD-10-CM | POA: Diagnosis not present

## 2024-10-13 DIAGNOSIS — Z7984 Long term (current) use of oral hypoglycemic drugs: Secondary | ICD-10-CM | POA: Insufficient documentation

## 2024-10-13 DIAGNOSIS — I10 Essential (primary) hypertension: Secondary | ICD-10-CM | POA: Diagnosis not present

## 2024-10-13 DIAGNOSIS — R600 Localized edema: Secondary | ICD-10-CM | POA: Diagnosis not present

## 2024-10-13 DIAGNOSIS — E1140 Type 2 diabetes mellitus with diabetic neuropathy, unspecified: Secondary | ICD-10-CM | POA: Diagnosis not present

## 2024-10-13 DIAGNOSIS — N1831 Chronic kidney disease, stage 3a: Secondary | ICD-10-CM | POA: Diagnosis not present

## 2024-10-13 DIAGNOSIS — E1122 Type 2 diabetes mellitus with diabetic chronic kidney disease: Secondary | ICD-10-CM

## 2024-10-13 DIAGNOSIS — I110 Hypertensive heart disease with heart failure: Secondary | ICD-10-CM | POA: Diagnosis not present

## 2024-10-13 DIAGNOSIS — Z7985 Long-term (current) use of injectable non-insulin antidiabetic drugs: Secondary | ICD-10-CM | POA: Insufficient documentation

## 2024-10-13 DIAGNOSIS — Z79899 Other long term (current) drug therapy: Secondary | ICD-10-CM | POA: Diagnosis not present

## 2024-10-13 MED ORDER — FUROSEMIDE 20 MG PO TABS: 10.0000 mg | ORAL_TABLET | Freq: Every day | ORAL | 3 refills | Status: AC

## 2024-10-13 MED ORDER — FUROSEMIDE 20 MG PO TABS: 10.0000 mg | ORAL_TABLET | Freq: Every day | ORAL | 1 refills | Status: DC

## 2024-10-13 NOTE — Patient Instructions (Signed)
 Medication Changes:  Increase Lasix  to 10 MG once daily   Lab Work:  Go downstairs to [place] on LOWER LEVEL to have your blood work completed.  We will only call you if the results are abnormal or if the provider would like to make medication changes.  No news is good news.   Follow-Up in: 2 months with Ellouise Class, FNP.   Thank you for choosing [facility].    At the [facility], you and your health needs are our priority. We have a designated team specialized in the treatment of Heart Failure. This Care Team includes your primary Heart Failure Specialized Cardiologist (physician), Advanced Practice Providers (APPs- Physician Assistants and Nurse Practitioners), and Pharmacist who all work together to provide you with the care you need, when you need it.   You may see any of the following providers on your designated Care Team at your next follow up:  Dr. Toribio Fuel Dr. Ezra Shuck Dr. Ria Commander Dr. Morene Brownie Ellouise Class, FNP Jaun Bash, RPH-CPP  Please be sure to bring in all your medications bottles to every appointment.   Need to Contact Us :  If you have any questions or concerns before your next appointment please send us  a message through [place] or call our office at [phone].    TO LEAVE A MESSAGE FOR THE NURSE SELECT OPTION 2, PLEASE LEAVE A MESSAGE INCLUDING: YOUR NAME DATE OF BIRTH CALL BACK NUMBER REASON FOR CALL**this is important as we prioritize the call backs  YOU WILL RECEIVE A CALL BACK THE SAME DAY AS LONG AS YOU CALL BEFORE 4:00 PM

## 2024-10-14 ENCOUNTER — Ambulatory Visit: Payer: Self-pay | Admitting: Family

## 2024-10-14 DIAGNOSIS — L400 Psoriasis vulgaris: Secondary | ICD-10-CM | POA: Diagnosis not present

## 2024-10-14 DIAGNOSIS — Z79899 Other long term (current) drug therapy: Secondary | ICD-10-CM | POA: Diagnosis not present

## 2024-10-14 DIAGNOSIS — Z79620 Long term (current) use of immunosuppressive biologic: Secondary | ICD-10-CM | POA: Diagnosis not present

## 2024-10-14 DIAGNOSIS — I5032 Chronic diastolic (congestive) heart failure: Secondary | ICD-10-CM

## 2024-10-14 LAB — BASIC METABOLIC PANEL WITH GFR
BUN/Creatinine Ratio: 9 — ABNORMAL LOW (ref 12–28)
BUN: 17 mg/dL (ref 8–27)
CO2: 26 mmol/L (ref 20–29)
Calcium: 10.7 mg/dL — ABNORMAL HIGH (ref 8.7–10.3)
Chloride: 97 mmol/L (ref 96–106)
Creatinine, Ser: 1.82 mg/dL — ABNORMAL HIGH (ref 0.57–1.00)
Glucose: 117 mg/dL — ABNORMAL HIGH (ref 70–99)
Potassium: 4 mmol/L (ref 3.5–5.2)
Sodium: 141 mmol/L (ref 134–144)
eGFR: 28 mL/min/1.73 — ABNORMAL LOW (ref >59)

## 2024-10-14 LAB — LIPID PANEL
Chol/HDL Ratio: 4.8 ratio — ABNORMAL HIGH (ref 0.0–4.4)
Cholesterol, Total: 262 mg/dL — ABNORMAL HIGH (ref 100–199)
HDL: 55 mg/dL (ref >39)
LDL Chol Calc (NIH): 144 mg/dL — ABNORMAL HIGH (ref 0–99)
Triglycerides: 345 mg/dL — ABNORMAL HIGH (ref 0–149)
VLDL Cholesterol Cal: 63 mg/dL — ABNORMAL HIGH (ref 5–40)

## 2024-10-14 MED ORDER — EZETIMIBE 10 MG PO TABS: 10.0000 mg | ORAL_TABLET | Freq: Every day | ORAL | 1 refills | Status: AC

## 2024-10-14 NOTE — Telephone Encounter (Signed)
 Pt aware, agreeable, and verbalized understanding.  Zetia sent to [facility] at pt's request. BMET order placed for 2 weeks.

## 2024-10-14 NOTE — Telephone Encounter (Signed)
-----   Message from Ellouise DELENA Class sent at 10/14/2024  8:10 AM EDT ----- Kidney function a little worse. Make sure drinking 60-64 ounces of fluid daily with most of that being water.  Lipids are worse so begin zetia 10mg  daily and let us  know if you have same side effects like you did with the statins.  BMET in 2 weeks ----- Message ----- From: Interface, [facility] Lab Results In Sent: 10/14/2024   2:36 AM EDT To: Ellouise DELENA Class, FNP

## 2024-10-24 ENCOUNTER — Other Ambulatory Visit: Payer: Self-pay | Admitting: Family Medicine

## 2024-10-25 ENCOUNTER — Other Ambulatory Visit: Payer: Self-pay

## 2024-10-25 NOTE — Patient Instructions (Signed)
 Visit Information  Thank you for taking time to visit with me today. Please don't hesitate to contact me if I can be of assistance to you before our next scheduled appointment.  Your next care management appointment is by telephone on Monday, December 1st at 10:30am.  Please call the care guide team at [phone] if you need to cancel, schedule, or reschedule an appointment.   Please call the [place]  National Suicide Prevention Lifeline: 956 552 6392 or TTY: 617-450-3562 TTY [phone]) to talk to a trained counselor if you are experiencing a Mental Health or Behavioral Health Crisis or need someone to talk to.  Santana Stamp BSN, CCM [place]  VBCI Population Health RN Care Manager Direct Dial: [phone]  Fax: [phone]

## 2024-10-25 NOTE — Patient Outreach (Signed)
 Complex Care Management   Visit Note  10/25/2024  Name:  Kelsey Mcdowell MRN: [id number] DOB: 13-Nov-1946  Situation: Referral received for Complex Care Management related to Heart Failure, Chronic Kidney Disease, and DM. I obtained verbal consent from Patient.  Visit completed with Kelsey Mcdowell  on the phone. Went to [employer] 10/13/24, MD prescribed Zetia 10mg  every day, kidney function was decreased, repeat labs ordered in two weeks.   Background:   Past Medical History:  Diagnosis Date   Arthritis    CHF (congestive heart failure) (HCC)    COPD (chronic obstructive pulmonary disease) (HCC)    Diabetes mellitus without complication (HCC)    GERD (gastroesophageal reflux disease)    Hyperlipemia    Hypertension    Myocardial infarction (HCC)    light heart attack ?when   Neuropathy    Sleep apnea     Assessment: Patient Reported Symptoms:  Cognitive Cognitive Status: No symptoms reported, Alert and oriented to person, place, and time, Insightful and able to interpret abstract concepts, Normal speech and language skills      Neurological Neurological Review of Symptoms: No symptoms reported    HEENT HEENT Symptoms Reported: No symptoms reported      Cardiovascular Cardiovascular Symptoms Reported: No symptoms reported Cardiovascular Comment: Cardiology started her on furosemide  20mg  1/2 tab daily for edema, patient states edema has improved, will have f/u with Cardio on 12/08/24, knows to call Cardio if edema worsens, or if [facility] presents.  Respiratory Respiratory Symptoms Reported: No symptoms reported    Endocrine Is patient diabetic?: Yes Is patient checking blood sugars at home?: Yes List most recent blood sugar readings, include date and time of day: Today's FBG 134mg /dl Endocrine Comment: Received Humalin and Lantus  from [employer], states she is taking as prescribed.  Gastrointestinal Gastrointestinal Symptoms Reported: No symptoms reported Additional  Gastrointestinal Details: The last few days BM's have been good      Genitourinary Genitourinary Symptoms Reported: No symptoms reported    Integumentary Integumentary Symptoms Reported: No symptoms reported    Musculoskeletal Musculoskelatal Symptoms Reviewed: No symptoms reported Musculoskeletal Comment: Started Zetia approx two weeks ago, denies pain in legs.  MD stopped last statin she was on due to muscular pain.      Psychosocial Psychosocial Symptoms Reported: No symptoms reported          10/25/2024    PHQ2-9 Depression Screening   Little interest or pleasure in doing things    Feeling down, depressed, or hopeless    PHQ-2 - Total Score    Trouble falling or staying asleep, or sleeping too much    Feeling tired or having little energy    Poor appetite or overeating     Feeling bad about yourself - or that you are a failure or have let yourself or your family down    Trouble concentrating on things, such as reading the newspaper or watching television    Moving or speaking so slowly that other people could have noticed.  Or the opposite - being so fidgety or restless that you have been moving around a lot more than usual    Thoughts that you would be better off dead, or hurting yourself in some way    PHQ2-9 Total Score    If you checked off any problems, how difficult have these problems made it for you to do your work, take care of things at home, or get along with other people    Depression Interventions/Treatment  There were no vitals filed for this visit.  Medications Reviewed Today     Reviewed by Lucian Santana LABOR, RN (Registered Nurse) on 10/25/24 at 1054  Med List Status: <None>   Medication Order Taking? Sig Documenting Provider Last Dose Status Informant  acetaminophen  (TYLENOL ) 500 MG tablet [id number]  Take 500 mg by mouth every 6 (six) hours as needed (back pain). [provider]  Active   Blood Glucose Monitoring Suppl (ACCU-CHEK GUIDE ME)  w/Device KIT [id number] Yes 1 each by Does not apply route daily. Johnson, Megan P, DO  Active   calcitRIOL (ROCALTROL) 0.25 MCG capsule [id number]  Take 0.25 mcg by mouth daily.  Patient taking differently: Take 0.25 mcg by mouth daily.   [provider]  Active   cholecalciferol (VITAMIN D3) 25 MCG (1000 UNIT) tablet [id number]  Take 1,000 Units by mouth daily. [provider]  Active   dapagliflozin  propanediol (FARXIGA ) 10 MG TABS tablet [id number] Yes Take 1 tablet (10 mg total) by mouth daily before breakfast. Donette Ellouise LABOR, FNP  Active            Med Note (GENTRY, CHERYL A   Tue Apr 13, 2024 10:27 AM) PAP  docusate sodium  (COLACE) 100 MG capsule [id number]  Take 100 mg by mouth daily. [provider]  Active   DULoxetine  (CYMBALTA ) 30 MG capsule [id number]  Take 1 capsule (30 mg total) by mouth daily. Johnson, Megan P, DO  Active   ezetimibe (ZETIA) 10 MG tablet [id number] Yes Take 1 tablet (10 mg total) by mouth daily. Donette Ellouise A, FNP  Active   fluticasone  (FLONASE ) 50 MCG/ACT nasal spray [id number]  Place 2 sprays into both nostrils daily. Cannady, Jolene T, NP  Active   furosemide  (LASIX ) 20 MG tablet [id number] Yes Take 0.5 tablets (10 mg total) by mouth daily. Donette Ellouise A, FNP  Active   furosemide  (LASIX ) 20 MG tablet [id number] Yes Take 0.5 tablets (10 mg total) by mouth daily. Donette Ellouise A, FNP  Active   gabapentin  (NEURONTIN ) 300 MG capsule [id number]  Take 1 cap in the AM and 2 in the PM [place], Megan P, DO  Active   glucose blood (ACCU-CHEK GUIDE TEST) test strip [id number]  Use as instructed Vicci Bouchard P, DO  Active   insulin  glargine (LANTUS ) 100 UNIT/ML Solostar Pen [id number] Yes Inject 30 Units into the skin at bedtime. Vicci Bouchard P, DO  Active   insulin  regular (HUMULIN R ) 100 units/mL injection [id number] Yes INJECT SUBCUTANEOUSLY 10 TO 15  UNITS 3 TIMES DAILY BEFORE MEALS Johnson, Megan P, DO  Active   metoprolol  succinate (TOPROL -XL) 25  MG 24 hr tablet [id number]  Take 1 tablet (25 mg total) by mouth daily. Johnson, Megan P, DO  Active   Multiple Vitamin (MULTIVITAMIN WITH MINERALS) TABS tablet [id number]  Take 1 tablet by mouth daily. [provider]  Active   nicotine  (NICODERM CQ  - DOSED IN MG/24 HR) 7 mg/24hr patch [id number]  Place 1 patch (7 mg total) onto the skin daily. Donette Ellouise LABOR, [place]  Active   NON FORMULARY [id number]   Vicci Bouchard SQUIBB, DO  Active   nortriptyline  (PAMELOR ) 50 MG capsule [id number]  TAKE 1 CAPSULE BY MOUTH ONCE  DAILY Johnson, Megan P, DO  Active   omeprazole  (PRILOSEC) 20 MG capsule [id number]  Take 1 capsule (20 mg total) by mouth daily. Vicci Bouchard P, DO  Active   OneTouch UltraSoft 2 Lancets MISC [id number]  USE AS INSTRUCTED TO  TEST BLOOD  SUGAR DAILY AT 2 PM Johnson, Megan P, DO  Active   sacubitril -valsartan  (ENTRESTO ) 24-26 MG [id number]  Take 1 tablet by mouth 2 (two) times daily. Donette Ellouise LABOR, FNP  Active   Secukinumab (COSENTYX [place]) [id number]  Inject into the skin every 30 (thirty) days. Last date give was the week of 11/25/22. Pt unsure of exact date. [provider]  Active            Med Note ZENA, CHERYL A   Wed Sep 24, 2023  8:40 AM) PAP  Semaglutide , 2 MG/DOSE, 8 MG/3ML SOPN [id number]  Inject 2 mg as directed once a week. Johnson, Megan P, DO  Active            Med Note (GENTRY, CHERYL A   Tue Apr 13, 2024 10:29 AM) Novo  spironolactone  (ALDACTONE ) 25 MG tablet [id number]  Take 1 tablet (25 mg total) by mouth daily. Johnson, Megan P, DO  Active   traZODone  (DESYREL ) 50 MG tablet [id number]  Take 1 tablet (50 mg total) by mouth at bedtime.  Patient taking differently: Take 50 mg by mouth at bedtime. prn   Vicci Bouchard P, DO  Active   triamcinolone  ointment (KENALOG ) 0.5 % [id number]  Apply 1 Application topically daily as needed. [provider]  Active   vitamin B-12 (CYANOCOBALAMIN) 1000 MCG tablet [id number]  Take 1,000 mcg by mouth daily. [provider]  Active             Recommendation:   Discussed upcoming appts: Specialty provider follow-up : Nephrology 10/27/24; Pulmonology 12/06/24; Cardiology 12/08/24 Patient will follow Cardio orders and try to drink 60-64 oz of water a day due to decreased kidney function, take Zetia as prescribed (will report IF she starts having musculoskeletal pain as she was with statins) take furosemide  as prescribed, follow up with repeat labwork for kidney function on 10/28/24.   Follow Up Plan:   Telephone follow-up in 1 month  Santana Stamp BSN, CCM [place]  VBCI Population Health RN Care Manager Direct Dial: [phone]  Fax: [phone]

## 2024-10-26 NOTE — Telephone Encounter (Signed)
 Humulin- 09/28/24 10ml 2RF- - will give 3 month supply Metoprolol - change in pharmacy- will forward remainder of Rx. Requested Prescriptions  Pending Prescriptions Disp Refills   HUMULIN R  100 UNIT/ML injection [Pharmacy Med Name: HumuLIN R  100 UNIT/ML Injection Solution] 30 mL 4    Sig: INJECT SUBCUTANEOUSLY 10 TO 15  UNITS 3 TIMES DAILY BEFORE MEALS     Endocrinology:  Diabetes - Insulins Passed - 10/26/2024  2:41 PM      Passed - HBA1C is between 0 and 7.9 and within 180 days    Hemoglobin A1C  Date Value Ref Range Status  04/24/2023 7.9  Final   HB A1C (BAYER DCA - WAIVED)  Date Value Ref Range Status  06/08/2024 7.8 (H) 4.8 - 5.6 % Final    Comment:             Prediabetes: 5.7 - 6.4          Diabetes: >6.4          Glycemic control for adults with diabetes: <7.0          Passed - Valid encounter within last 6 months    Recent Outpatient Visits           2 months ago COVID-19   [place] [facility] Melvin Pao, NP   4 months ago Type 2 diabetes mellitus with cardiac complication [facility])   [place] [facility], Megan P, DO   4 months ago Type 2 diabetes mellitus with cardiac complication [facility])   [place] [facility] [place], Megan P, DO   5 months ago Type 2 diabetes mellitus with cardiac complication [facility])   [place] [facility] [employer], Megan P, DO   6 months ago Type 2 diabetes mellitus with cardiac complication [facility])   [place] [facility] [place], Megan P, DO               metoprolol  succinate (TOPROL -XL) 25 MG 24 hr tablet [Pharmacy Med Name: Metoprolol  Succinate ER 25 MG Oral Tablet Extended Release 24 Hour] 100 tablet 2    Sig: TAKE 1 TABLET BY MOUTH ONCE  DAILY     Cardiovascular:  Beta Blockers Failed - 10/26/2024  2:41 PM      Failed - Last BP in normal range    BP Readings from Last 1 Encounters:  10/13/24 (!) 147/78         Passed - Last Heart Rate in normal  range    Pulse Readings from Last 1 Encounters:  10/13/24 84         Passed - Valid encounter within last 6 months    Recent Outpatient Visits           2 months ago COVID-19   [place] [facility] [place], Pao, NP   4 months ago Type 2 diabetes mellitus with cardiac complication [facility])   [place], Megan P, DO   4 months ago Type 2 diabetes mellitus with cardiac complication [facility])   [place] [facility] [place], Megan P, DO   5 months ago Type 2 diabetes mellitus with cardiac complication [facility])   [place] [facility] [place], Megan P, DO   6 months ago Type 2 diabetes mellitus with cardiac complication [facility])   [place] [facility] [place], Megan P, DO

## 2024-10-28 ENCOUNTER — Other Ambulatory Visit
Admission: RE | Admit: 2024-10-28 | Discharge: 2024-10-28 | Disposition: A | Discharge status: Home / Self Care | Attending: Family | Admitting: Family

## 2024-10-28 ENCOUNTER — Other Ambulatory Visit: Payer: Self-pay | Admitting: Emergency Medicine

## 2024-10-28 ENCOUNTER — Other Ambulatory Visit
Admission: RE | Admit: 2024-10-28 | Discharge: 2024-10-28 | Disposition: A | Discharge status: Home / Self Care | Attending: Dermatology | Admitting: Dermatology

## 2024-10-28 DIAGNOSIS — Z79899 Other long term (current) drug therapy: Secondary | ICD-10-CM | POA: Diagnosis present

## 2024-10-28 DIAGNOSIS — I5032 Chronic diastolic (congestive) heart failure: Secondary | ICD-10-CM

## 2024-10-28 LAB — CBC WITH DIFFERENTIAL/PLATELET
Abs Immature Granulocytes: 0.01 K/uL (ref 0.00–0.07)
Basophils Absolute: 0.1 K/uL (ref 0.0–0.1)
Basophils Relative: 1 %
Eosinophils Absolute: 0.2 K/uL (ref 0.0–0.5)
Eosinophils Relative: 2 %
HCT: 43.8 % (ref 36.0–46.0)
Hemoglobin: 14 g/dL (ref 12.0–15.0)
Immature Granulocytes: 0 %
Lymphocytes Relative: 40 %
Lymphs Abs: 2.6 K/uL (ref 0.7–4.0)
MCH: 32 pg (ref 26.0–34.0)
MCHC: 32 g/dL (ref 30.0–36.0)
MCV: 100.2 fL — ABNORMAL HIGH (ref 80.0–100.0)
Monocytes Absolute: 0.5 K/uL (ref 0.1–1.0)
Monocytes Relative: 7 %
Neutro Abs: 3.3 K/uL (ref 1.7–7.7)
Neutrophils Relative %: 50 %
Platelets: 221 K/uL (ref 150–400)
RBC: 4.37 MIL/uL (ref 3.87–5.11)
RDW: 14.6 % (ref 11.5–15.5)
WBC: 6.6 K/uL (ref 4.0–10.5)
nRBC: 0 % (ref 0.0–0.2)

## 2024-10-28 LAB — COMPREHENSIVE METABOLIC PANEL WITH GFR
ALT: 20 U/L (ref 0–44)
AST: 23 U/L (ref 15–41)
Albumin: 3.5 g/dL (ref 3.5–5.0)
Alkaline Phosphatase: 59 U/L (ref 38–126)
Anion gap: 15 (ref 5–15)
BUN: 29 mg/dL — ABNORMAL HIGH (ref 8–23)
CO2: 26 mmol/L (ref 22–32)
Calcium: 9.8 mg/dL (ref 8.9–10.3)
Chloride: 96 mmol/L — ABNORMAL LOW (ref 98–111)
Creatinine, Ser: 2.24 mg/dL — ABNORMAL HIGH (ref 0.44–1.00)
GFR, Estimated: 22 mL/min — ABNORMAL LOW (ref >60)
Glucose, Bld: 280 mg/dL — ABNORMAL HIGH (ref 70–99)
Potassium: 5 mmol/L (ref 3.5–5.1)
Sodium: 137 mmol/L (ref 135–145)
Total Bilirubin: 0.6 mg/dL (ref 0.0–1.2)
Total Protein: 8.3 g/dL — ABNORMAL HIGH (ref 6.5–8.1)

## 2024-10-30 LAB — QUANTIFERON-TB GOLD PLUS: QuantiFERON-TB Gold Plus: NEGATIVE

## 2024-10-30 LAB — QUANTIFERON-TB GOLD PLUS (RQFGPL)
QuantiFERON Mitogen Value: >10 [IU]/mL
QuantiFERON Nil Value: 0.06 [IU]/mL
QuantiFERON TB1 Ag Value: 0.07 [IU]/mL
QuantiFERON TB2 Ag Value: 0.07 [IU]/mL

## 2024-11-02 LAB — OPHTHALMOLOGY REPORT-SCANNED

## 2024-11-04 ENCOUNTER — Telehealth: Payer: Self-pay

## 2024-11-04 NOTE — Telephone Encounter (Signed)
 Received a refill request on Novo Nordisk Ozempic  filled and faxed to provider office to sign and date.

## 2024-11-05 ENCOUNTER — Other Ambulatory Visit: Payer: Self-pay | Admitting: Family Medicine

## 2024-11-08 NOTE — Telephone Encounter (Signed)
 Requested medication (s) are due forefill today: routing for review  Requested medication (s) are on the active medication list: yes  Last refill:  n/a  Future visit scheduled: no  Notes to clinic:  unable to attach protocol.     Requested Prescriptions  Pending Prescriptions Disp Refills   LANTUS  SOLOSTAR 100 UNIT/ML Solostar Pen [Pharmacy Med Name: Lantus  SoloStar 100 UNIT/ML Subcutaneous Solution Pen-injector] 15 mL 6    Sig: INJECT SUBCUTANEOUSLY 30 UNITS  AT BEDTIME     There is no refill protocol information for this order

## 2024-11-09 NOTE — Telephone Encounter (Signed)
 Called patient however VM is not active so unable to leave a message. If/when she returns call please assist with needed DM appointment with PCP as we are unable to fill Lantus  rx without her being seen due to n/s appointment on 09/02/2024.

## 2024-11-10 ENCOUNTER — Telehealth: Payer: Self-pay

## 2024-11-10 NOTE — Telephone Encounter (Signed)
 Gave pt a call pt is coming due for re-enrollment on AZ&ME Farxiga  ,spoke with pt wants pap mail out.

## 2024-11-11 NOTE — Telephone Encounter (Signed)
 Received refill re-order form from Provider office Novo Nordisk Ozempic  faxed to Novo Nordisk today.

## 2024-11-15 ENCOUNTER — Other Ambulatory Visit: Payer: Self-pay

## 2024-11-15 MED ORDER — SACUBITRIL-VALSARTAN 24-26 MG PO TABS: 1.0000 | ORAL_TABLET | Freq: Two times a day (BID) | ORAL | 3 refills | Status: AC

## 2024-11-15 NOTE — Telephone Encounter (Signed)
 Received provider portion AZ&ME Farxiga  Today.waiting on pt portion

## 2024-11-22 ENCOUNTER — Other Ambulatory Visit: Payer: Self-pay

## 2024-11-22 NOTE — Patient Outreach (Signed)
 Complex Care Management   Visit Note  11/22/2024  Name:  Kelsey Mcdowell MRN: [id number] DOB: Oct 02, 1946  Situation: Referral received for Complex Care Management related to DM I obtained verbal consent from Patient.  Visit completed with Kelsey Mcdowell  on the phone  Background:   Past Medical History:  Diagnosis Date   Arthritis    CHF (congestive heart failure) (HCC)    COPD (chronic obstructive pulmonary disease) (HCC)    Diabetes mellitus without complication (HCC)    GERD (gastroesophageal reflux disease)    Hyperlipemia    Hypertension    Myocardial infarction (HCC)    light heart attack ?when   Neuropathy    Sleep apnea     Assessment: Patient Reported Symptoms:  Cognitive Cognitive Status: No symptoms reported, Alert and oriented to person, place, and time, Insightful and able to interpret abstract concepts, Normal speech and language skills      Neurological Neurological Review of Symptoms: No symptoms reported    HEENT HEENT Symptoms Reported: No symptoms reported      Cardiovascular Cardiovascular Symptoms Reported: No symptoms reported    Respiratory Respiratory Symptoms Reported: No symptoms reported    Endocrine Endocrine Symptoms Reported: No symptoms reported Is patient diabetic?: Yes Is patient checking blood sugars at home?: Yes List most recent blood sugar readings, include date and time of day: today's FBG 150mg /dL. Endocrine Comment: Takes Ozempic , Humalin, Lantus  as prescribed.  Gastrointestinal Gastrointestinal Symptoms Reported: Constipation Additional Gastrointestinal Details: Reports constipation that lasts three days after she takes Ozempic . Discussed adding Colace back into her med regimen, she is currently taking Miralax daily.      Genitourinary Genitourinary Symptoms Reported: No symptoms reported    Integumentary Integumentary Symptoms Reported: No symptoms reported    Musculoskeletal Musculoskelatal Symptoms Reviewed: No  symptoms reported        Psychosocial Psychosocial Symptoms Reported: No symptoms reported          11/22/2024    PHQ2-9 Depression Screening   Little interest or pleasure in doing things    Feeling down, depressed, or hopeless    PHQ-2 - Total Score    Trouble falling or staying asleep, or sleeping too much    Feeling tired or having little energy    Poor appetite or overeating     Feeling bad about yourself - or that you are a failure or have let yourself or your family down    Trouble concentrating on things, such as reading the newspaper or watching television    Moving or speaking so slowly that other people could have noticed.  Or the opposite - being so fidgety or restless that you have been moving around a lot more than usual    Thoughts that you would be better off dead, or hurting yourself in some way    PHQ2-9 Total Score    If you checked off any problems, how difficult have these problems made it for you to do your work, take care of things at home, or get along with other people    Depression Interventions/Treatment      There were no vitals filed for this visit. Pain Scale: 0-10 Pain Score: 0-No pain  Medications Reviewed Today   Medications were not reviewed in this encounter     Recommendation:   Continue Current Plan of Care Specialists appointments: Pulmonary 12/06/24; Cardiology 12/08/24: she will also have phone visit with Kelsey Mcdowell [facility] on 11/24/24 and will discuss patient assistance for Entresto  and Farxiga .  Follow Up Plan:   Telephone follow-up in 1 month  Santana Stamp BSN, CCM [place]  [facility] Population Health RN Care Manager Direct Dial: [phone]  Fax: [phone]

## 2024-11-22 NOTE — Patient Instructions (Signed)
 Visit Information  Thank you for taking time to visit with me today. Please don't hesitate to contact me if I can be of assistance to you before our next scheduled appointment.  Your next care management appointment is by telephone on Monday, December 29th at 10:30am.   Please call the care guide team at [phone] if you need to cancel, schedule, or reschedule an appointment.   Please call the [place]  National Suicide Prevention Lifeline: 743-129-9164 or TTY: 317-728-8578 TTY [phone]) to talk to a trained counselor if you are experiencing a Mental Health or Behavioral Health Crisis or need someone to talk to.  Santana Stamp BSN, CCM [place]  VBCI Population Health RN Care Manager Direct Dial: [phone]  Fax: [phone]

## 2024-11-23 NOTE — Progress Notes (Unsigned)
   11/23/2024  Patient ID: Kelsey Mcdowell, female   DOB: [birth year], 78 y.o.   MRN: [id number]  Subjective/objective Telephone visit to follow-up on management of type 2 diabetes   Diabetes management plan -Current medications: Farxiga  10 mg daily, Ozempic  2mg  weekly, Lantus  30 units HS, Humulin R  10-15 units TID with meals -Patient does monitor home blood glucose and endorses FBG 90-130 and post-prandial 180-200 -Patient does not endorse any signs or symptoms of hypoglycemia -Patient states days that she has to get out for something she may not use Humulin TID, but once or twice instead -She also endorses days when she is at home snacking quite a bit evening BG will be >200 -Last A1c was 7.8% in June -Receives Farxiga  through [facility] PAP and Ozempic  through Novo PAP- patient also enrolled in [facility] Medicare Extra Help -Currently has at least 3 boxes of Ozempic  2mg  on hand   Assessment/plan   Diabetes management plan -Continue current regimen at this time  -Patient sees PCP again 9/11 and will be due for A1c  -If A1c continues to be >7%, consider increasing total daily insulin  dose by 10%- Lantus  33 untis, Humulin 11-16 units TID with meals -Continue regular monitoring of home blood glucose -Advised patient to inform me when down to last Ozempic  pen, so we can work on reorder form since Novo PAP is no longer doing automatic refills  Follow-up: 3 months   Kelsey Mcdowell, PharmD, DPLA

## 2024-11-24 ENCOUNTER — Other Ambulatory Visit: Payer: Self-pay

## 2024-11-24 DIAGNOSIS — E1159 Type 2 diabetes mellitus with other circulatory complications: Secondary | ICD-10-CM

## 2024-11-24 DIAGNOSIS — E782 Mixed hyperlipidemia: Secondary | ICD-10-CM

## 2024-11-24 DIAGNOSIS — I10 Essential (primary) hypertension: Secondary | ICD-10-CM

## 2024-11-25 NOTE — Progress Notes (Signed)
Patient needs appointment ASAP.

## 2024-11-29 ENCOUNTER — Other Ambulatory Visit: Payer: Self-pay | Admitting: Family Medicine

## 2024-11-29 MED ORDER — INSULIN GLARGINE 100 UNIT/ML SOLOSTAR PEN: 30.0000 [IU] | PEN_INJECTOR | Freq: Every day | SUBCUTANEOUS | 0 refills | Status: DC

## 2024-11-30 NOTE — Telephone Encounter (Signed)
 Received pt portion [facility] (Farxiga ) today faxed to [facility] along provider portion.

## 2024-12-07 ENCOUNTER — Telehealth: Payer: Self-pay | Admitting: Family

## 2024-12-07 LAB — OPHTHALMOLOGY REPORT-SCANNED

## 2024-12-07 NOTE — Telephone Encounter (Signed)
 Called to confirm/remind patient of their appointment at the [facility] on 12/08/24.   Appointment:   [] Confirmed  [x] Left mess   [] No answer/No voice mail  [] VM Full/unable to leave message  [] Phone not in service  Patient reminded to bring all medications and/or complete list.  Confirmed patient has transportation. Gave directions, instructed to utilize valet parking.

## 2024-12-07 NOTE — Progress Notes (Unsigned)
 [facility] Note    PCP: Vicci Bouchard, DO  Primary Cardiologist: Clarisa Kung PA (last seen 11/25; returns 05/25)  Chief Complaint: shortness of breath   HPI:  Kelsey Mcdowell is a 78 yo with a PMHx of COPD, acute on chronic systolic CHF, DM II, tobacco abuse, obesity, and HTN.  Admitted to [facility] 01/27/20[facility], treated for acute hypoxic hypercapnic respiratory failure requiring BiPAP, treated with IV Lasix  and IV Solu-Medrol . She was discharged on supplemental oxygen  and Lasix  20 mg. Beta-blocker was deferred due to bronchospasm initially. She was discharged on lisinopril. She was also noted to be anemic with hemoglobin 8.9 and received IV Venofer . Chest x-ray showed cardiomegaly with bilateral airspace disease, likely edema/CHF with no effusions. 2D echocardiogram on 01/28/2022 revealed mildly reduced left ventricular function with LVEF 40 to 45%. There were no previous echocardiograms for comparison.   Lexiscan Myoview was performed 03/07/2022. Gated scintigraphy revealed LV ejection fraction of 33%. SPECT analysis revealed a mild anterior scar mixed with minimal ischemia.   Admitted 04/24/23 due to several days of diarrhea, decreased PO intake and worsening weakness. Found to have oliguric AKI. Held furosemide , aldactone , entresto , hydrochlorothiazide, dapagliflozin  during hospital stay.   Echo 08/20/23: EF 60-65% with mild LVH, Grade I DD  Seen in the HF Clinic 02/17/24 and had spironolactone  increased to 25mg  daily.   She presents today for a HF follow-up visit with a chief complaint of minimal shortness of breath. Has associated fatigue, occasional dizziness, leg pain especially with walking, numbness/ tingling in legs. Reports a good appetite. She says that the pain in her legs is getting worse. She endorses that her feet get cold often but denies any color changes. She stopped ozempic  due to constipation  Tried taking rosuvastatin  and had cramps in her hands/ worsening  neuropathy like when she took atorvastatin. Has been tolerating zetia  without side effects.   ROS: All systems negative except as listed in HPI, PMH and Problem List.  SH:  Social History   Socioeconomic History   Marital status: Widowed    Spouse name: Not on file   Number of children: 0   Years of education: Not on file   Highest education level: Not on file  Occupational History   Not on file  Tobacco Use   Smoking status: Every Day    Current packs/day: 0.50    Average packs/day: 1.5 packs/day for 61.8 years (92.0 ttl pk-yrs)    Types: Cigarettes    Start date: 1964    Last attempt to quit: 02/01/2024   Smokeless tobacco: Never  Vaping Use   Vaping status: Never Used  Substance and Sexual Activity   Alcohol  use: Not Currently    Comment: occasional, last use 2024   Drug use: No   Sexual activity: Not Currently  Other Topics Concern   Not on file  Social History Narrative   Not on file   Social Drivers of Health   Tobacco Use: Medium Risk (12/06/2024)   Received from [facility]   Patient History    Smoking Tobacco Use: Former    Smokeless Tobacco Use: Never    Passive Exposure: Not on file  Financial Resource Strain: Low Risk (06/15/2024)   Overall Financial Resource Strain (CARDIA)    Difficulty of Paying Living Expenses: Not hard at all  Food Insecurity: No Food Insecurity (06/15/2024)   Epic    Worried About Running Out of Food in the Last Year: Never true    Ran  Out of Food in the Last Year: Never true  Transportation Needs: No Transportation Needs (06/15/2024)   Epic    Lack of Transportation (Medical): No    Lack of Transportation (Non-Medical): No  Physical Activity: Inactive (06/15/2024)   Exercise Vital Sign    Days of Exercise per Week: 0 days    Minutes of Exercise per Session: 0 min  Stress: Stress Concern Present (06/15/2024)   [employer] of Occupational Health - Occupational Stress Questionnaire    Feeling of Stress: To  some extent  Social Connections: Moderately Isolated (06/15/2024)   Social Connection and Isolation Panel    Frequency of Communication with Friends and Family: More than three times a week    Frequency of Social Gatherings with Friends and Family: More than three times a week    Attends Religious Services: More than 4 times per year    Active Member of [employer] or Organizations: No    Attends Banker Meetings: Never    Marital Status: Widowed  Intimate Partner Violence: Not At Risk (06/15/2024)   Epic    Fear of Current or Ex-Partner: No    Emotionally Abused: No    Physically Abused: No    Sexually Abused: No  Depression (PHQ2-9): Low Risk (07/07/2024)   Depression (PHQ2-9)    PHQ-2 Score: 0  Recent Concern: Depression (PHQ2-9) - Medium Risk (05/03/2024)   Depression (PHQ2-9)    PHQ-2 Score: 10  Alcohol  Screen: Low Risk (06/15/2024)   Alcohol  Screen    Last Alcohol  Screening Score (AUDIT): 0  Housing: Low Risk (06/15/2024)   Epic    Unable to Pay for Housing in the Last Year: No    Number of Times Moved in the Last Year: 0    Homeless in the Last Year: No  Utilities: Not At Risk (06/15/2024)   Epic    Threatened with loss of utilities: No  Health Literacy: Adequate Health Literacy (06/15/2024)   B1300 Health Literacy    Frequency of need for help with medical instructions: Never    FH:  Family History  Problem Relation Age of Onset   Cancer Mother    Hypertension Mother    Diabetes Mother    Heart disease Father    Diabetes Father    Cancer Sister    Breast cancer Sister 11   Heart disease Brother     Past Medical History:  Diagnosis Date   Arthritis    CHF (congestive heart failure) (HCC)    COPD (chronic obstructive pulmonary disease) (HCC)    Diabetes mellitus without complication (HCC)    GERD (gastroesophageal reflux disease)    Hyperlipemia    Hypertension    Myocardial infarction (HCC)    light heart attack ?when   Neuropathy    Sleep apnea      Current Outpatient Medications  Medication Sig Dispense Refill   acetaminophen  (TYLENOL ) 500 MG tablet Take 500 mg by mouth every 6 (six) hours as needed (back pain).     Blood Glucose Monitoring Suppl (ACCU-CHEK GUIDE ME) w/Device KIT 1 each by Does not apply route daily. 1 kit 0   calcitRIOL (ROCALTROL) 0.5 MCG capsule Take 0.5 mcg by mouth daily.     cholecalciferol (VITAMIN D3) 25 MCG (1000 UNIT) tablet Take 1,000 Units by mouth daily.     dapagliflozin  propanediol (FARXIGA ) 10 MG TABS tablet Take 1 tablet (10 mg total) by mouth daily before breakfast. 90 tablet 3   docusate sodium  (COLACE) 100 MG  capsule Take 100 mg by mouth daily.     DULoxetine  (CYMBALTA ) 30 MG capsule Take 1 capsule (30 mg total) by mouth daily. (Patient not taking: Reported on 11/24/2024) 90 capsule 1   ezetimibe  (ZETIA ) 10 MG tablet Take 1 tablet (10 mg total) by mouth daily. 90 tablet 1   fluticasone  (FLONASE ) 50 MCG/ACT nasal spray Place 2 sprays into both nostrils daily. 16 g 12   furosemide  (LASIX ) 20 MG tablet Take 0.5 tablets (10 mg total) by mouth daily. 45 tablet 3   gabapentin  (NEURONTIN ) 300 MG capsule Take 1 cap in the AM and 2 in the PM 270 capsule 1   glucose blood (ACCU-CHEK GUIDE TEST) test strip Use as instructed 100 each 12   insulin  glargine (LANTUS ) 100 UNIT/ML Solostar Pen Inject 30 Units into the skin at bedtime. 15 mL 0   insulin  regular (HUMULIN R ) 100 units/mL injection INJECT SUBCUTANEOUSLY 10 TO 15  UNITS 3 TIMES DAILY BEFORE MEALS 30 mL 0   metoprolol  succinate (TOPROL -XL) 25 MG 24 hr tablet TAKE 1 TABLET BY MOUTH ONCE  DAILY 100 tablet 1   Multiple Vitamin (MULTIVITAMIN WITH MINERALS) TABS tablet Take 1 tablet by mouth daily.     nicotine  (NICODERM CQ  - DOSED IN MG/24 HR) 7 mg/24hr patch Place 1 patch (7 mg total) onto the skin daily. 28 patch 3   nortriptyline  (PAMELOR ) 50 MG capsule TAKE 1 CAPSULE BY MOUTH ONCE  DAILY 30 capsule 6   omeprazole  (PRILOSEC) 20 MG capsule Take 1 capsule  (20 mg total) by mouth daily. 90 capsule 3   OneTouch UltraSoft 2 Lancets MISC USE AS INSTRUCTED TO TEST BLOOD  SUGAR DAILY AT 2 PM 100 each 2   sacubitril -valsartan  (ENTRESTO ) 24-26 MG Take 1 tablet by mouth 2 (two) times daily. 180 tablet 3   Secukinumab (COSENTYX [place]) Inject into the skin every 30 (thirty) days. Last date give was the week of 11/25/22. Pt unsure of exact date.     Semaglutide , 2 MG/DOSE, 8 MG/3ML SOPN Inject 2 mg as directed once a week. 3 mL 1   spironolactone  (ALDACTONE ) 25 MG tablet Take 1 tablet (25 mg total) by mouth daily. 90 tablet 1   traZODone  (DESYREL ) 50 MG tablet Take 1 tablet (50 mg total) by mouth at bedtime. (Patient not taking: Reported on 11/24/2024) 90 tablet 1   triamcinolone  ointment (KENALOG ) 0.5 % Apply 1 Application topically daily as needed.     vitamin B-12 (CYANOCOBALAMIN) 1000 MCG tablet Take 1,000 mcg by mouth daily.     Current Facility-Administered Medications  Medication Dose Route Frequency Provider Last Rate Last Admin   NON FORMULARY   Other UD        Vitals:   12/08/24 0952  BP: 137/65  Pulse: 81  SpO2: 93%  Weight: 191 lb (86.6 kg)   Wt Readings from Last 3 Encounters:  12/08/24 190 lb 6.4 oz (86.4 kg)  12/08/24 191 lb (86.6 kg)  10/13/24 190 lb (86.2 kg)   Lab Results  Component Value Date   CREATININE 2.24 (H) 10/28/2024   CREATININE 1.82 (H) 10/13/2024   CREATININE 1.68 (H) 04/13/2024    PHYSICAL EXAM:  General: Well appearing.  Cor: No JVD. Regular rhythm, rate.  Lungs: clear Abdomen: soft, nontender, nondistended. Extremities: trace pitting edema bilateral lower legs Neuro:. Affect pleasant   ECG: not done   ASSESSMENT & PLAN:  NICM with preserved ejection fraction- - likely due to to HTN/ COPD - NYHA class II -  euvolemic - weight unchanged last visit here 2 months ago. Admits that she's not very active due to her leg pain/ neuropathy - echo 01/28/22: EF 40-45% LV with mildly decreased function and global  hypokinesis with mild LVH - echo 08/20/23: EF 60-65% with mild LVH, Grade I DD  - continue farxiga  10mg  daily - continue furosemide  10mg  daily. Edema has improved. She does not like to wear compression socks - continue metoprolol  succinate 25mg  daily - continue entresto  24/26mg  BID. May have to change this to valsartan  if generic entresto  is cost prohibitive. Patient to let us  know if that happens - continue spironolactone  25mg  daily  - not adding salt to her food but has been eating out more often - encouraged as much activity as possible - BNP 01/27/22 721.9  Hypertension- - BP 137/65 - saw PCP Warrick) 08/25 - BMP 10/28/24 reviewed: sodium 137, potassium 5.0, creatinine 2.24 and GFR 22 - BMET today at PCP office  COPD- - saw pulmonologist Alica) 12/25 - she was unable to commit to pulmonary rehab because of her living 1/2 hour away from [facility]   DM- - A1c 06/08/24 was 7.8%  - she stopped ozempic  due to constipation - saw nephrology Geoffry) 11/25  5: Tobacco use- - smoking ~ 1/2 ppd currently and wants to quit but says that she can't gain anymore weight - cessation discussed  6: Hyperlipidemia- - LDL 10/13/24 was 144. Zetia  10mg  daily was started and currently tolerating it - tried taking rosuvastatin  and had worsening neuropathy like she did when she previously taken atorvastatin so she stopped taking it - saw cardiology (Paraschos) 11/25  7: Bilateral lower leg pain- - reports bilateral leg pain which is limiting her ability to exercise. Also endorses numbness / tingling in extremities as well as feeling like her feet are cold often - will refer to vascular for possible ABI. Patient is agreeable.    Return in 3 months, sooner if needed.   I spent 32 minutes reviewing records, interviewing/ examing patient and managing plan/ orders.    Ellouise DELENA Class, FNP-C 12/07/2024

## 2024-12-08 ENCOUNTER — Ambulatory Visit: Attending: Family | Admitting: Family

## 2024-12-08 ENCOUNTER — Ambulatory Visit: Admitting: Family

## 2024-12-08 ENCOUNTER — Encounter: Payer: Self-pay | Admitting: Family

## 2024-12-08 ENCOUNTER — Encounter: Payer: Self-pay | Admitting: Family Medicine

## 2024-12-08 ENCOUNTER — Ambulatory Visit: Admitting: Family Medicine

## 2024-12-08 ENCOUNTER — Other Ambulatory Visit (HOSPITAL_COMMUNITY): Payer: Self-pay

## 2024-12-08 VITALS — BP 137/65 | HR 81 | Wt 191.0 lb

## 2024-12-08 VITALS — BP 132/82 | HR 80 | Temp 98.0°F | Ht 60.0 in | Wt 190.4 lb

## 2024-12-08 DIAGNOSIS — E669 Obesity, unspecified: Secondary | ICD-10-CM | POA: Diagnosis not present

## 2024-12-08 DIAGNOSIS — I10 Essential (primary) hypertension: Secondary | ICD-10-CM

## 2024-12-08 DIAGNOSIS — Z720 Tobacco use: Secondary | ICD-10-CM | POA: Diagnosis not present

## 2024-12-08 DIAGNOSIS — D509 Iron deficiency anemia, unspecified: Secondary | ICD-10-CM | POA: Diagnosis not present

## 2024-12-08 DIAGNOSIS — R5383 Other fatigue: Secondary | ICD-10-CM | POA: Insufficient documentation

## 2024-12-08 DIAGNOSIS — M79604 Pain in right leg: Secondary | ICD-10-CM

## 2024-12-08 DIAGNOSIS — R0602 Shortness of breath: Secondary | ICD-10-CM | POA: Diagnosis present

## 2024-12-08 DIAGNOSIS — I110 Hypertensive heart disease with heart failure: Secondary | ICD-10-CM | POA: Diagnosis not present

## 2024-12-08 DIAGNOSIS — E782 Mixed hyperlipidemia: Secondary | ICD-10-CM

## 2024-12-08 DIAGNOSIS — N1831 Chronic kidney disease, stage 3a: Secondary | ICD-10-CM | POA: Diagnosis not present

## 2024-12-08 DIAGNOSIS — R200 Anesthesia of skin: Secondary | ICD-10-CM | POA: Insufficient documentation

## 2024-12-08 DIAGNOSIS — Z7985 Long-term (current) use of injectable non-insulin antidiabetic drugs: Secondary | ICD-10-CM | POA: Insufficient documentation

## 2024-12-08 DIAGNOSIS — M609 Myositis, unspecified: Secondary | ICD-10-CM | POA: Diagnosis not present

## 2024-12-08 DIAGNOSIS — M79662 Pain in left lower leg: Secondary | ICD-10-CM | POA: Diagnosis not present

## 2024-12-08 DIAGNOSIS — E1140 Type 2 diabetes mellitus with diabetic neuropathy, unspecified: Secondary | ICD-10-CM | POA: Diagnosis not present

## 2024-12-08 DIAGNOSIS — Z794 Long term (current) use of insulin: Secondary | ICD-10-CM | POA: Insufficient documentation

## 2024-12-08 DIAGNOSIS — J449 Chronic obstructive pulmonary disease, unspecified: Secondary | ICD-10-CM | POA: Diagnosis not present

## 2024-12-08 DIAGNOSIS — N184 Chronic kidney disease, stage 4 (severe): Secondary | ICD-10-CM

## 2024-12-08 DIAGNOSIS — M79661 Pain in right lower leg: Secondary | ICD-10-CM | POA: Diagnosis not present

## 2024-12-08 DIAGNOSIS — R202 Paresthesia of skin: Secondary | ICD-10-CM | POA: Diagnosis not present

## 2024-12-08 DIAGNOSIS — E1159 Type 2 diabetes mellitus with other circulatory complications: Secondary | ICD-10-CM

## 2024-12-08 DIAGNOSIS — E1122 Type 2 diabetes mellitus with diabetic chronic kidney disease: Secondary | ICD-10-CM

## 2024-12-08 DIAGNOSIS — F17210 Nicotine dependence, cigarettes, uncomplicated: Secondary | ICD-10-CM | POA: Insufficient documentation

## 2024-12-08 DIAGNOSIS — M79605 Pain in left leg: Secondary | ICD-10-CM

## 2024-12-08 DIAGNOSIS — F321 Major depressive disorder, single episode, moderate: Secondary | ICD-10-CM | POA: Diagnosis not present

## 2024-12-08 DIAGNOSIS — T466X5A Adverse effect of antihyperlipidemic and antiarteriosclerotic drugs, initial encounter: Secondary | ICD-10-CM

## 2024-12-08 DIAGNOSIS — I5032 Chronic diastolic (congestive) heart failure: Secondary | ICD-10-CM | POA: Insufficient documentation

## 2024-12-08 DIAGNOSIS — Z7984 Long term (current) use of oral hypoglycemic drugs: Secondary | ICD-10-CM | POA: Diagnosis not present

## 2024-12-08 DIAGNOSIS — I428 Other cardiomyopathies: Secondary | ICD-10-CM | POA: Diagnosis not present

## 2024-12-08 DIAGNOSIS — E785 Hyperlipidemia, unspecified: Secondary | ICD-10-CM | POA: Insufficient documentation

## 2024-12-08 LAB — MICROALBUMIN, URINE WAIVED
Creatinine, Urine Waived: 50 mg/dL (ref 10–300)
Microalb, Ur Waived: 80 mg/L — ABNORMAL HIGH (ref 0–19)

## 2024-12-08 LAB — BAYER DCA HB A1C WAIVED: HB A1C (BAYER DCA - WAIVED): 7.8 % — ABNORMAL HIGH (ref 4.8–5.6)

## 2024-12-08 MED ORDER — TIRZEPATIDE 7.5 MG/0.5ML ~~LOC~~ SOAJ: 7.5000 mg | SUBCUTANEOUS | 0 refills | Status: DC

## 2024-12-08 NOTE — Patient Instructions (Signed)
 Medication Changes:  No medication changes today!   Referrals:  You have been referred to [facility] for your leg pain. They will contact you in order to schedule your appointment.    Follow-Up in: Please follow up with the [facility] in 3 months with Ellouise Class, FNP.   Thank you for choosing [facility].    At the [facility], you and your health needs are our priority. We have a designated team specialized in the treatment of Heart Failure. This Care Team includes your primary Heart Failure Specialized Cardiologist (physician), Advanced Practice Providers (APPs- Physician Assistants and Nurse Practitioners), and Pharmacist who all work together to provide you with the care you need, when you need it.   You may see any of the following providers on your designated Care Team at your next follow up:  Dr. Toribio Fuel Dr. Ezra Shuck Dr. Ria Commander Dr. Morene Brownie Ellouise Class, FNP Jaun Bash, RPH-CPP  Please be sure to bring in all your medications bottles to every appointment.   Need to Contact Us :  If you have any questions or concerns before your next appointment please send us  a message through [place] or call our office at [phone].    TO LEAVE A MESSAGE FOR THE NURSE SELECT OPTION 2, PLEASE LEAVE A MESSAGE INCLUDING: YOUR NAME DATE OF BIRTH CALL BACK NUMBER REASON FOR CALL**this is important as we prioritize the call backs  YOU WILL RECEIVE A CALL BACK THE SAME DAY AS LONG AS YOU CALL BEFORE 4:00 PM

## 2024-12-08 NOTE — Progress Notes (Unsigned)
 BP 132/82   Pulse 80   Temp 98 F (36.7 C) (Oral)   Ht 5' (1.524 m)   Wt 190 lb 6.4 oz (86.4 kg)   SpO2 92%   BMI 37.18 kg/m    Subjective:    Patient ID: Kelsey Mcdowell, female    DOB: 1946/07/17, 78 y.o.   MRN: [id number]  HPI: Kelsey Mcdowell is a 78 y.o. female  Chief Complaint  Patient presents with   Diabetes   DIABETES- stopped her ozempic  about 2 weeks ago due to constipation.  Hypoglycemic episodes:yes- couple of times Polydipsia/polyuria: no Visual disturbance: no Chest pain: no Paresthesias: no Glucose Monitoring: yes  Accucheck frequency: TID Taking Insulin ?: yes  Long acting insulin : 30 units at bedtime  Short acting insulin : sliding scale 15-25 Blood Pressure Monitoring: not checking Retinal Examination: Up to Date Foot Exam: Up to Date Diabetic Education: Completed Pneumovax: Up to Date Influenza: Not up to Date Aspirin: no  HYPERTENSION / HYPERLIPIDEMIA Satisfied with current treatment? yes Duration of hypertension: chronic BP monitoring frequency: not checking BP medication side effects: no Past BP meds: lasix , metoprolol , entresto , spironalactone Duration of hyperlipidemia: chronic Cholesterol medication side effects: no Cholesterol supplements: none Past cholesterol medications: zetia  Medication compliance: good compliance Aspirin: yes Recent stressors: no Recurrent headaches: no Visual changes: no Palpitations: no Dyspnea: no Chest pain: no Lower extremity edema: no Dizzy/lightheaded: no  DEPRESSION Mood status: controlled Satisfied with current treatment?: no Symptom severity: mild  Duration of current treatment : chronic Side effects: no Medication compliance: excellent compliance Psychotherapy/counseling: no  Previous psychiatric medications: cymbalta  Depressed mood: no Anxious mood: no Anhedonia: no Significant weight loss or gain: no Insomnia: no  Fatigue: no Feelings of worthlessness or guilt:  no Impaired concentration/indecisiveness: no Suicidal ideations: no Hopelessness: no Crying spells: no    12/08/2024   11:37 AM 07/07/2024    4:08 PM 06/15/2024   10:58 AM 05/03/2024   11:40 AM 04/12/2024    8:15 AM  Depression screen PHQ 2/9  Decreased Interest 0 0 0 1 0  Down, Depressed, Hopeless 0 0 0 1 0  PHQ - 2 Score 0 0 0 2 0  Altered sleeping 0  0 2 0  Tired, decreased energy 3  3 3  0  Change in appetite 0  0 1 0  Feeling bad or failure about yourself  2  0 1 0  Trouble concentrating 0  0 0 0  Moving slowly or fidgety/restless 0  0 1 0  Suicidal thoughts 0  0 0 0  PHQ-9 Score 5  3  10   0   Difficult doing work/chores   Not difficult at all Somewhat difficult Not difficult at all     Data saved with a previous flowsheet row definition    Relevant past medical, surgical, family and social history reviewed and updated as indicated. Interim medical history since our last visit reviewed. Allergies and medications reviewed and updated.  Review of Systems  Constitutional: Negative.   Respiratory: Negative.    Cardiovascular: Negative.   Gastrointestinal: Negative.   Musculoskeletal: Negative.   Skin: Negative.   Neurological: Negative.   Psychiatric/Behavioral: Negative.      Per HPI unless specifically indicated above     Objective:    BP 132/82   Pulse 80   Temp 98 F (36.7 C) (Oral)   Ht 5' (1.524 m)   Wt 190 lb 6.4 oz (86.4 kg)   SpO2 92%   BMI 37.18 kg/m  Wt Readings from Last 3 Encounters:  12/08/24 190 lb 6.4 oz (86.4 kg)  12/08/24 191 lb (86.6 kg)  10/13/24 190 lb (86.2 kg)    Physical Exam Vitals and nursing note reviewed.  Constitutional:      General: She is not in acute distress.    Appearance: Normal appearance. She is not ill-appearing, toxic-appearing or diaphoretic.  HENT:     Head: Normocephalic and atraumatic.     Right Ear: External ear normal.     Left Ear: External ear normal.     Nose: Nose normal.     Mouth/Throat:      Mouth: Mucous membranes are moist.     Pharynx: Oropharynx is clear.  Eyes:     General: No scleral icterus.       Right eye: No discharge.        Left eye: No discharge.     Extraocular Movements: Extraocular movements intact.     Conjunctiva/sclera: Conjunctivae normal.     Pupils: Pupils are equal, round, and reactive to light.  Cardiovascular:     Rate and Rhythm: Normal rate and regular rhythm.     Pulses: Normal pulses.     Heart sounds: Normal heart sounds. No murmur heard.    No friction rub. No gallop.  Pulmonary:     Effort: Pulmonary effort is normal. No respiratory distress.     Breath sounds: Normal breath sounds. No stridor. No wheezing, rhonchi or rales.  Chest:     Chest wall: No tenderness.  Musculoskeletal:        General: Normal range of motion.     Cervical back: Normal range of motion and neck supple.  Skin:    General: Skin is warm and dry.     Capillary Refill: Capillary refill takes less than 2 seconds.     Coloration: Skin is not jaundiced or pale.     Findings: No bruising, erythema, lesion or rash.  Neurological:     General: No focal deficit present.     Mental Status: She is alert and oriented to person, place, and time. Mental status is at baseline.  Psychiatric:        Mood and Affect: Mood normal.        Behavior: Behavior normal.        Thought Content: Thought content normal.        Judgment: Judgment normal.     Results for orders placed or performed in visit on 12/08/24  Bayer DCA Hb A1c Waived   Collection Time: 12/08/24 11:38 AM  Result Value Ref Range   HB A1C (BAYER DCA - WAIVED) 7.8 (H) 4.8 - 5.6 %  Microalbumin, Urine Waived   Collection Time: 12/08/24 11:38 AM  Result Value Ref Range   Microalb, Ur Waived 80 (H) 0 - 19 mg/L   Creatinine, Urine Waived 50 10 - 300 mg/dL   Microalb/Creat Ratio 30-300 (H) <30 mg/g  CBC with Differential/Platelet   Collection Time: 12/08/24 11:39 AM  Result Value Ref Range   WBC 7.0 3.4 - 10.8  x10E3/uL   RBC 4.54 3.77 - 5.28 x10E6/uL   Hemoglobin 14.7 11.1 - 15.9 g/dL   Hematocrit 54.1 65.9 - 46.6 %   MCV 101 (H) 79 - 97 fL   MCH 32.4 26.6 - 33.0 pg   MCHC 32.1 31.5 - 35.7 g/dL   RDW 85.4 88.2 - 84.5 %   Platelets 233 150 - 450 x10E3/uL   Neutrophils 53 Not Estab. %  Lymphs 36 Not Estab. %   Monocytes 7 Not Estab. %   Eos 3 Not Estab. %   Basos 1 Not Estab. %   Neutrophils Absolute 3.7 1.4 - 7.0 x10E3/uL   Lymphocytes Absolute 2.5 0.7 - 3.1 x10E3/uL   Monocytes Absolute 0.5 0.1 - 0.9 x10E3/uL   EOS (ABSOLUTE) 0.2 0.0 - 0.4 x10E3/uL   Basophils Absolute 0.1 0.0 - 0.2 x10E3/uL   Immature Granulocytes 0 Not Estab. %   Immature Grans (Abs) 0.0 0.0 - 0.1 x10E3/uL  Comprehensive metabolic panel with GFR   Collection Time: 12/08/24 11:39 AM  Result Value Ref Range   Glucose 140 (H) 70 - 99 mg/dL   BUN 23 8 - 27 mg/dL   Creatinine, Ser 7.83 (H) 0.57 - 1.00 mg/dL   eGFR 23 (L) >40 fO/fpw/8.26   BUN/Creatinine Ratio 11 (L) 12 - 28   Sodium 142 134 - 144 mmol/L   Potassium 4.6 3.5 - 5.2 mmol/L   Chloride 97 96 - 106 mmol/L   CO2 25 20 - 29 mmol/L   Calcium  10.1 8.7 - 10.3 mg/dL   Total Protein 7.7 6.0 - 8.5 g/dL   Albumin 4.3 3.8 - 4.8 g/dL   Globulin, Total 3.4 1.5 - 4.5 g/dL   Bilirubin Total 0.4 0.0 - 1.2 mg/dL   Alkaline Phosphatase 74 49 - 135 IU/L   AST 17 0 - 40 IU/L   ALT 13 0 - 32 IU/L  Lipid Panel w/o Chol/HDL Ratio   Collection Time: 12/08/24 11:39 AM  Result Value Ref Range   Cholesterol, Total 263 (H) 100 - 199 mg/dL   Triglycerides 713 (H) 0 - 149 mg/dL   HDL 63 >60 mg/dL   VLDL Cholesterol Cal 52 (H) 5 - 40 mg/dL   LDL Chol Calc (NIH) 851 (H) 0 - 99 mg/dL  TSH   Collection Time: 12/08/24 11:39 AM  Result Value Ref Range   TSH 5.160 (H) 0.450 - 4.500 uIU/mL  Magnesium    Collection Time: 12/08/24 11:39 AM  Result Value Ref Range   Magnesium  1.8 1.6 - 2.3 mg/dL  Ferritin   Collection Time: 12/08/24 11:39 AM  Result Value Ref Range   Ferritin 61  15 - 150 ng/mL  Iron  Binding Cap (TIBC)([facility])   Collection Time: 12/08/24 11:39 AM  Result Value Ref Range   Total Iron  Binding Capacity 376 250 - 450 ug/dL   UIBC 746 881 - 630 ug/dL   Iron  123 27 - 139 ug/dL   Iron  Saturation 33 15 - 55 %      Assessment & Plan:   Problem List Items Addressed This Visit       Cardiovascular and Mediastinum   Type 2 diabetes mellitus with cardiac complication (HCC)   A1c stable at 7.8 but stopped her ozempic  due to contstipation. Will see about transitioning her to mounjaro  for fewer side effects. Continue current regimen. Recheck in about a month. Call with any concerns.       Relevant Medications   tirzepatide  (MOUNJARO ) 7.5 MG/0.5ML Pen   Other Relevant Orders   Bayer DCA Hb A1c Waived (Completed)   CBC with Differential/Platelet (Completed)   Comprehensive metabolic panel with GFR (Completed)   Microalbumin, Urine Waived (Completed)   Hypertension   Under good control on current regimen. Continue current regimen. Continue to monitor. Call with any concerns. Refills given.        Relevant Orders   CBC with Differential/Platelet (Completed)   Comprehensive metabolic panel with  GFR (Completed)   TSH (Completed)   Microalbumin, Urine Waived (Completed)     Musculoskeletal and Integument   Statin-induced myositis   Unable to tolerate statins. Continue zetia . Continue to monitor.       Relevant Orders   CBC with Differential/Platelet (Completed)   Comprehensive metabolic panel with GFR (Completed)     Genitourinary   Chronic kidney disease, stage 4 (severe) (HCC)   Rechecking labs today. Await results. Will likely need to see nephrology.         Other   Hypomagnesemia   Rechecking labs today. Await results. Treat as needed.       Relevant Orders   Magnesium  (Completed)   Iron  deficiency anemia - Primary   Rechecking labs today. Await results. Treat as needed.       Relevant Orders   CBC with  Differential/Platelet (Completed)   Comprehensive metabolic panel with GFR (Completed)   Ferritin (Completed)   Iron  Binding Cap (TIBC)([facility]) (Completed)   Depression, major, single episode, moderate (HCC)   Under good control on current regimen. Continue current regimen. Continue to monitor. Call with any concerns. Refills given.        Relevant Orders   CBC with Differential/Platelet (Completed)   Comprehensive metabolic panel with GFR (Completed)   Mixed hyperlipidemia   Under good control on current regimen. Continue current regimen. Continue to monitor. Call with any concerns. Refills given. Labs drawn today.       Relevant Orders   CBC with Differential/Platelet (Completed)   Comprehensive metabolic panel with GFR (Completed)   Lipid Panel w/o Chol/HDL Ratio (Completed)   Obesity, morbid (HCC)   Due to DM, HTN, HLD. Encouraged diet and exercise with goal of losing 1-2lbs per week. Continue to monitor.       Relevant Medications   tirzepatide  (MOUNJARO ) 7.5 MG/0.5ML Pen     Follow up plan: Return in about 6 weeks (around 01/19/2025).

## 2024-12-09 DIAGNOSIS — N184 Chronic kidney disease, stage 4 (severe): Secondary | ICD-10-CM | POA: Insufficient documentation

## 2024-12-09 LAB — CBC WITH DIFFERENTIAL/PLATELET
Basophils Absolute: 0.1 x10E3/uL (ref 0.0–0.2)
Basos: 1 %
EOS (ABSOLUTE): 0.2 x10E3/uL (ref 0.0–0.4)
Eos: 3 %
Hematocrit: 45.8 % (ref 34.0–46.6)
Hemoglobin: 14.7 g/dL (ref 11.1–15.9)
Immature Grans (Abs): 0 x10E3/uL (ref 0.0–0.1)
Immature Granulocytes: 0 %
Lymphocytes Absolute: 2.5 x10E3/uL (ref 0.7–3.1)
Lymphs: 36 %
MCH: 32.4 pg (ref 26.6–33.0)
MCHC: 32.1 g/dL (ref 31.5–35.7)
MCV: 101 fL — ABNORMAL HIGH (ref 79–97)
Monocytes Absolute: 0.5 x10E3/uL (ref 0.1–0.9)
Monocytes: 7 %
Neutrophils Absolute: 3.7 x10E3/uL (ref 1.4–7.0)
Neutrophils: 53 %
Platelets: 233 x10E3/uL (ref 150–450)
RBC: 4.54 x10E6/uL (ref 3.77–5.28)
RDW: 14.5 % (ref 11.7–15.4)
WBC: 7 x10E3/uL (ref 3.4–10.8)

## 2024-12-09 LAB — COMPREHENSIVE METABOLIC PANEL WITH GFR
ALT: 13 IU/L (ref 0–32)
AST: 17 IU/L (ref 0–40)
Albumin: 4.3 g/dL (ref 3.8–4.8)
Alkaline Phosphatase: 74 IU/L (ref 49–135)
BUN/Creatinine Ratio: 11 — ABNORMAL LOW (ref 12–28)
BUN: 23 mg/dL (ref 8–27)
Bilirubin Total: 0.4 mg/dL (ref 0.0–1.2)
CO2: 25 mmol/L (ref 20–29)
Calcium: 10.1 mg/dL (ref 8.7–10.3)
Chloride: 97 mmol/L (ref 96–106)
Creatinine, Ser: 2.16 mg/dL — ABNORMAL HIGH (ref 0.57–1.00)
Globulin, Total: 3.4 g/dL (ref 1.5–4.5)
Glucose: 140 mg/dL — ABNORMAL HIGH (ref 70–99)
Potassium: 4.6 mmol/L (ref 3.5–5.2)
Sodium: 142 mmol/L (ref 134–144)
Total Protein: 7.7 g/dL (ref 6.0–8.5)
eGFR: 23 mL/min/1.73 — ABNORMAL LOW (ref >59)

## 2024-12-09 LAB — LIPID PANEL W/O CHOL/HDL RATIO
Cholesterol, Total: 263 mg/dL — ABNORMAL HIGH (ref 100–199)
HDL: 63 mg/dL (ref >39)
LDL Chol Calc (NIH): 148 mg/dL — ABNORMAL HIGH (ref 0–99)
Triglycerides: 286 mg/dL — ABNORMAL HIGH (ref 0–149)
VLDL Cholesterol Cal: 52 mg/dL — ABNORMAL HIGH (ref 5–40)

## 2024-12-09 LAB — IRON AND TIBC
Iron Saturation: 33 % (ref 15–55)
Iron: 123 ug/dL (ref 27–139)
Total Iron Binding Capacity: 376 ug/dL (ref 250–450)
UIBC: 253 ug/dL (ref 118–369)

## 2024-12-09 LAB — MAGNESIUM: Magnesium: 1.8 mg/dL (ref 1.6–2.3)

## 2024-12-09 LAB — TSH: TSH: 5.16 u[IU]/mL — ABNORMAL HIGH (ref 0.450–4.500)

## 2024-12-09 LAB — FERRITIN: Ferritin: 61 ng/mL (ref 15–150)

## 2024-12-09 NOTE — Assessment & Plan Note (Signed)
 Under good control on current regimen. Continue current regimen. Continue to monitor. Call with any concerns. Refills given. Labs drawn today.

## 2024-12-09 NOTE — Assessment & Plan Note (Signed)
 Under good control on current regimen. Continue current regimen. Continue to monitor. Call with any concerns. Refills given.

## 2024-12-09 NOTE — Assessment & Plan Note (Signed)
 Rechecking labs today. Await results. Treat as needed.

## 2024-12-09 NOTE — Assessment & Plan Note (Signed)
 Rechecking labs today. Await results. Will likely need to see nephrology.

## 2024-12-09 NOTE — Assessment & Plan Note (Signed)
 Due to DM, HTN, HLD. Encouraged diet and exercise with goal of losing 1-2lbs per week. Continue to monitor.

## 2024-12-09 NOTE — Assessment & Plan Note (Signed)
 A1c stable at 7.8 but stopped her ozempic  due to contstipation. Will see about transitioning her to mounjaro  for fewer side effects. Continue current regimen. Recheck in about a month. Call with any concerns.

## 2024-12-09 NOTE — Assessment & Plan Note (Signed)
 Unable to tolerate statins. Continue zetia . Continue to monitor.

## 2024-12-10 ENCOUNTER — Ambulatory Visit: Payer: Self-pay | Admitting: Family Medicine

## 2024-12-10 MED ORDER — REPATHA SURECLICK 140 MG/ML ~~LOC~~ SOAJ: 140.0000 mg | SUBCUTANEOUS | 2 refills | Status: AC

## 2024-12-14 ENCOUNTER — Encounter: Payer: Self-pay | Admitting: Family Medicine

## 2024-12-16 ENCOUNTER — Other Ambulatory Visit: Payer: Self-pay | Admitting: Family Medicine

## 2024-12-17 NOTE — Telephone Encounter (Signed)
 Requested Prescriptions  Pending Prescriptions Disp Refills   HUMULIN R  100 UNIT/ML injection [Pharmacy Med Name: HumuLIN R  100 UNIT/ML Injection Solution] 30 mL 0    Sig: INJECT SUBCUTANEOUSLY 10 TO 15  UNITS 3 TIMES DAILY BEFORE MEALS     Endocrinology:  Diabetes - Insulins Passed - 12/17/2024  6:45 PM      Passed - HBA1C is between 0 and 7.9 and within 180 days    Hemoglobin A1C  Date Value Ref Range Status  04/24/2023 7.9  Final   HB A1C (BAYER DCA - WAIVED)  Date Value Ref Range Status  12/08/2024 7.8 (H) 4.8 - 5.6 % Final    Comment:             Prediabetes: 5.7 - 6.4          Diabetes: >6.4          Glycemic control for adults with diabetes: <7.0          Passed - Valid encounter within last 6 months    Recent Outpatient Visits           1 week ago Iron  deficiency anemia, unspecified iron  deficiency anemia type   [place] [facility] [place], Megan P, DO   4 months ago COVID-19   [place] [facility] [place], Darice, NP   5 months ago Type 2 diabetes mellitus with cardiac complication [facility])   [place] [facility] [place], Megan P, DO   6 months ago Type 2 diabetes mellitus with cardiac complication [facility])   [place] [facility] [place], Megan P, DO   7 months ago Type 2 diabetes mellitus with cardiac complication [facility])   [place] [facility] [place], Megan P, DO               LANTUS  SOLOSTAR 100 UNIT/ML Solostar Pen [employer] Med Name: Lantus  SoloStar 100 UNIT/ML Subcutaneous Solution Pen-injector] 15 mL 0    Sig: INJECT SUBCUTANEOUSLY 30 UNITS  AT BEDTIME     Endocrinology:  Diabetes - Insulins Passed - 12/17/2024  6:45 PM      Passed - HBA1C is between 0 and 7.9 and within 180 days    Hemoglobin A1C  Date Value Ref Range Status  04/24/2023 7.9  Final   HB A1C (BAYER DCA - WAIVED)  Date Value Ref Range Status  12/08/2024 7.8 (H) 4.8 - 5.6 % Final    Comment:              Prediabetes: 5.7 - 6.4          Diabetes: >6.4          Glycemic control for adults with diabetes: <7.0          Passed - Valid encounter within last 6 months    Recent Outpatient Visits           1 week ago Iron  deficiency anemia, unspecified iron  deficiency anemia type   [place] [facility] [place], [place], DO   4 months ago COVID-19   [facility] [place], Darice, NP   5 months ago Type 2 diabetes mellitus with cardiac complication [facility])   [place] [facility] [place], Megan P, DO   6 months ago Type 2 diabetes mellitus with cardiac complication [facility])   [place] [facility] [place], Megan P, DO   7 months ago Type 2 diabetes mellitus with cardiac complication (HCC)  [place] [facility] [place], [place], [place]

## 2024-12-20 ENCOUNTER — Other Ambulatory Visit: Payer: Self-pay

## 2024-12-20 NOTE — Patient Instructions (Signed)
 Visit Information  Thank you for taking time to visit with me today. Please don't hesitate to contact me if I can be of assistance to you before our next scheduled appointment.  Your next care management appointment is by telephone on 12/22/2024 at 1045   Please call the care guide team at [phone] if you need to cancel, schedule, or reschedule an appointment.   Please call the [place]  National Suicide Prevention Lifeline: 703-161-6325 or TTY: (346) 570-7731 TTY [phone]) to talk to a trained counselor if you are experiencing a Mental Health or Behavioral Health Crisis or need someone to talk to.  Elida Pulse, RNCM Case Manager [facility], Population Health Direct Dial: [phone]

## 2024-12-20 NOTE — Patient Outreach (Addendum)
 Complex Care Management   Visit Note  12/20/2024  Name:  Kelsey Mcdowell MRN: [id number] DOB: 1945-12-26  Situation: Referral received for Complex Care Management related to DM I obtained verbal consent from Patient.  Visit completed with [name] on the phone  Background:   Past Medical History:  Diagnosis Date   Arthritis    CHF (congestive heart failure) (HCC)    COPD (chronic obstructive pulmonary disease) (HCC)    Diabetes mellitus without complication (HCC)    GERD (gastroesophageal reflux disease)    Hyperlipemia    Hypertension    Myocardial infarction (HCC)    light heart attack ?when   Neuropathy    Sleep apnea     Assessment: Patient Reported Symptoms:  Cognitive Cognitive Status: No symptoms reported, Alert and oriented to person, place, and time, Insightful and able to interpret abstract concepts, Normal speech and language skills      Neurological Neurological Review of Symptoms: Not assessed    HEENT HEENT Symptoms Reported: Not assessed      Cardiovascular Cardiovascular Symptoms Reported: Not assessed Weight: 190 lb (86.2 kg)  Respiratory Respiratory Symptoms Reported: Not assesed Other Respiratory Symptoms: No audible SOB or wheezing. Speaking in complete sentences    Endocrine Endocrine Symptoms Reported: Hyperglycemia Is patient diabetic?: Yes Is patient checking blood sugars at home?: Yes List most recent blood sugar readings, include date and time of day: Todays FBS=63. Pt admits to indulging in Sweet candy and has had BGM over 300 yeaterday evening. Endocrine Self-Management Outcome: 3 (uncertain) Endocrine Comment: Discuused what health foods pt actually enjoys and how she currently eats meals.  She has mostly just snacked through the day and does not prepare meals due to only cooking for her and that it hard to do since you have a big mess to clean up after. Discussed 2 meals pt can make with little prep and clean up for only one  person. Crockpot homemade vegetable soup and one pan baked chicken and vegetables.  Gastrointestinal Gastrointestinal Symptoms Reported: Constipation Additional Gastrointestinal Details: when taking Ozempic       Genitourinary Genitourinary Symptoms Reported: Not assessed    Integumentary Integumentary Symptoms Reported: Not assessed    Musculoskeletal Musculoskelatal Symptoms Reviewed: Not assessed        Psychosocial Psychosocial Symptoms Reported: Not assessed          12/20/2024    PHQ2-9 Depression Screening   Little interest or pleasure in doing things    Feeling down, depressed, or hopeless    PHQ-2 - Total Score    Trouble falling or staying asleep, or sleeping too much    Feeling tired or having little energy    Poor appetite or overeating     Feeling bad about yourself - or that you are a failure or have let yourself or your family down    Trouble concentrating on things, such as reading the newspaper or watching television    Moving or speaking so slowly that other people could have noticed.  Or the opposite - being so fidgety or restless that you have been moving around a lot more than usual    Thoughts that you would be better off dead, or hurting yourself in some way    PHQ2-9 Total Score    If you checked off any problems, how difficult have these problems made it for you to do your work, take care of things at home, or get along with other people    Depression Interventions/Treatment  Today's Vitals   12/20/24 1124  Weight: 190 lb (86.2 kg)   Pain Scale: 0-10 Pain Score: 0-No pain  Medications Reviewed Today   Medications were not reviewed in this encounter     Recommendation:   Discuused what health foods pt actually enjoys and how she currently eats meals. She has mostly just snacked through the day and does not prepare meals due to only cooking for her and that it hard to do since you have a big mess to clean up after. Discussed 2 meals pt can  make with little prep and clean up for only one person. Crockpot homemade vegetable soup and one pan baked chicken and vegetables.  Pt will make one of discussed meals and confirm her new email so we can get her back into Mychart. Think about planting just a couple vegetable plants in the coming spring.  Follow Up Plan:   Telephone follow up appointment date/time:  12/22/2024 to confirm new email and update in system, regain access to MyChart. Discussed how health meal prep went.   Elida Pulse, RNCM Case Manager [facility], Population Health Direct Dial: [phone]

## 2024-12-22 ENCOUNTER — Other Ambulatory Visit: Payer: Self-pay

## 2024-12-22 NOTE — Patient Instructions (Signed)
 Visit Information  Thank you for taking time to visit with me today. Please don't hesitate to contact me if I can be of assistance to you before our next scheduled appointment.  Your next care management appointment is by telephone on 01/10/2025 at 1030   Please call the care guide team at [phone] if you need to cancel, schedule, or reschedule an appointment.   Please call the [place]  National Suicide Prevention Lifeline: 314-231-9668 or TTY: (417) 733-7817 TTY (403)643-0774) to talk to a trained counselor if you are experiencing a Mental Health or Behavioral Health Crisis or need someone to talk to.  Elida Pulse, RNCM Case Manager [facility], Population Health Direct Dial: [phone]

## 2024-12-22 NOTE — Patient Outreach (Signed)
 Complex Care Management   Visit Note  12/22/2024  Name:  Kelsey Mcdowell MRN: [id number] DOB: Dec 11, 1946  Situation: Referral received for Complex Care Management related to DM. I obtained verbal consent from Patient.  Visit completed with Kelsey Mcdowell  on the phone  Background:   Past Medical History:  Diagnosis Date   Arthritis    CHF (congestive heart failure) (HCC)    COPD (chronic obstructive pulmonary disease) (HCC)    Diabetes mellitus without complication (HCC)    GERD (gastroesophageal reflux disease)    Hyperlipemia    Hypertension    Myocardial infarction (HCC)    light heart attack ?when   Neuropathy    Sleep apnea     Assessment: Patient Reported Symptoms:  Cognitive Cognitive Status: Alert and oriented to person, place, and time, Insightful and able to interpret abstract concepts, Normal speech and language skills      Neurological Neurological Review of Symptoms: Not assessed    HEENT HEENT Symptoms Reported: Not assessed      Cardiovascular Cardiovascular Symptoms Reported: Not assessed    Respiratory Respiratory Symptoms Reported: Not assesed    Endocrine Endocrine Symptoms Reported: Not assessed    Gastrointestinal Gastrointestinal Symptoms Reported: Not assessed      Genitourinary Genitourinary Symptoms Reported: Not assessed    Integumentary Integumentary Symptoms Reported: Not assessed    Musculoskeletal Musculoskelatal Symptoms Reviewed: Not assessed        Psychosocial Psychosocial Symptoms Reported: Not assessed          12/22/2024    PHQ2-9 Depression Screening   Little interest or pleasure in doing things    Feeling down, depressed, or hopeless    PHQ-2 - Total Score    Trouble falling or staying asleep, or sleeping too much    Feeling tired or having little energy    Poor appetite or overeating     Feeling bad about yourself - or that you are a failure or have let yourself or your family down    Trouble  concentrating on things, such as reading the newspaper or watching television    Moving or speaking so slowly that other people could have noticed.  Or the opposite - being so fidgety or restless that you have been moving around a lot more than usual    Thoughts that you would be better off dead, or hurting yourself in some way    PHQ2-9 Total Score    If you checked off any problems, how difficult have these problems made it for you to do your work, take care of things at home, or get along with other people    Depression Interventions/Treatment      There were no vitals filed for this visit. Pain Scale: 0-10 Pain Score: 0-No pain  Medications Reviewed Today   Medications were not reviewed in this encounter     Recommendation:   PCP Follow-up Continue Current Plan of Care Attend upcoming PCP OV on 01/20/2025. Pt to cook one of the meals discussed and confirm her new email so we can help her regain access to MyChart.  Follow Up Plan:   Telephone follow-up 3 weeks Telephone follow up appointment date/time:  01/10/2025@1030   Elida Pulse, RNCM Case Manager [facility] [place], [facility] Health Direct Dial: [phone]

## 2024-12-31 NOTE — Telephone Encounter (Signed)
 Gave [employer] ) a call to follow up on pt's application ,per [employer] pt is approved thru 12/22/2025.

## 2025-01-03 ENCOUNTER — Other Ambulatory Visit: Payer: Self-pay | Admitting: Family Medicine

## 2025-01-05 ENCOUNTER — Other Ambulatory Visit: Payer: Self-pay

## 2025-01-05 NOTE — Patient Outreach (Addendum)
 Complex Care Management   Visit Note  01/05/2025  Name:  Kelsey Mcdowell MRN: [id number] DOB: 1946-07-06  Situation: Referral received for Complex Care Management related to DM. I obtained verbal consent from Patient.  Visit completed with Kelsey Mcdowell  on the phone  Background:   Past Medical History:  Diagnosis Date   Arthritis    CHF (congestive heart failure) (HCC)    COPD (chronic obstructive pulmonary disease) (HCC)    Diabetes mellitus without complication (HCC)    GERD (gastroesophageal reflux disease)    Hyperlipemia    Hypertension    Myocardial infarction (HCC)    light heart attack ?when   Neuropathy    Sleep apnea     Assessment: Patient Reported Symptoms:  Cognitive Cognitive Status: Alert and oriented to person, place, and time, Insightful and able to interpret abstract concepts, Normal speech and language skills      Neurological Neurological Review of Symptoms: No symptoms reported    HEENT HEENT Symptoms Reported: No symptoms reported      Cardiovascular Cardiovascular Symptoms Reported: No symptoms reported    Respiratory Respiratory Symptoms Reported: No symptoms reported    Endocrine Endocrine Symptoms Reported: No symptoms reported Is patient diabetic?: Yes Is patient checking blood sugars at home?: Yes List most recent blood sugar readings, include date and time of day: Today's FBG 179mg /dL.  States she ate a piece of chocolate before going to bed. BG was 77 before going to bed.    Gastrointestinal Gastrointestinal Symptoms Reported: Not assessed      Genitourinary Genitourinary Symptoms Reported: No symptoms reported    Integumentary Integumentary Symptoms Reported: No symptoms reported    Musculoskeletal Musculoskelatal Symptoms Reviewed: Not assessed        Psychosocial Psychosocial Symptoms Reported: Not assessed          01/05/2025    PHQ2-9 Depression Screening   Little interest or pleasure in doing things     Feeling down, depressed, or hopeless    PHQ-2 - Total Score    Trouble falling or staying asleep, or sleeping too much    Feeling tired or having little energy    Poor appetite or overeating     Feeling bad about yourself - or that you are a failure or have let yourself or your family down    Trouble concentrating on things, such as reading the newspaper or watching television    Moving or speaking so slowly that other people could have noticed.  Or the opposite - being so fidgety or restless that you have been moving around a lot more than usual    Thoughts that you would be better off dead, or hurting yourself in some way    PHQ2-9 Total Score    If you checked off any problems, how difficult have these problems made it for you to do your work, take care of things at home, or get along with other people    Depression Interventions/Treatment      There were no vitals filed for this visit.    Medications Reviewed Today     Reviewed by Lucian Santana LABOR, RN (Registered Nurse) on 01/05/25 at 1729  Med List Status: <None>   Medication Order Taking? Sig Documenting Provider Last Dose Status Informant  acetaminophen  (TYLENOL ) 500 MG tablet [id number]  Take 500 mg by mouth every 6 (six) hours as needed (back pain). [provider]  Active   Blood Glucose Monitoring Suppl (ACCU-CHEK GUIDE ME) w/Device KIT [id number]  1 each by Does not apply route daily. Johnson, Megan P, DO  Active   calcitRIOL (ROCALTROL) 0.5 MCG capsule [id number]  Take 0.5 mcg by mouth daily. [provider]  Active   cholecalciferol (VITAMIN D3) 25 MCG (1000 UNIT) tablet [id number]  Take 1,000 Units by mouth daily. [provider]  Active   dapagliflozin  propanediol (FARXIGA ) 10 MG TABS tablet [id number]  Take 1 tablet (10 mg total) by mouth daily before breakfast. Donette Ellouise LABOR, FNP  Active            Med Note (GENTRY, CHERYL A   Tue Apr 13, 2024 10:27 AM) PAP  docusate sodium  (COLACE) 100 MG  capsule [id number]  Take 100 mg by mouth daily. [provider]  Active   DULoxetine  (CYMBALTA ) 30 MG capsule [id number]  Take 1 capsule (30 mg total) by mouth daily. Johnson, Megan P, DO  Active   Evolocumab  (REPATHA  SURECLICK) 140 MG/ML SOAJ [id number]  Inject 140 mg into the skin every 14 (fourteen) days. Johnson, Megan P, DO  Active   ezetimibe  (ZETIA ) 10 MG tablet [id number]  Take 1 tablet (10 mg total) by mouth daily. Donette Ellouise A, FNP  Active   fluticasone  (FLONASE ) 50 MCG/ACT nasal spray [id number]  Place 2 sprays into both nostrils daily. Cannady, Jolene T, NP  Active   furosemide  (LASIX ) 20 MG tablet [id number]  Take 0.5 tablets (10 mg total) by mouth daily. Donette Ellouise LABOR, FNP  Active   gabapentin  (NEURONTIN ) 300 MG capsule [id number]  Take 1 cap in the AM and 2 in the PM Lambert, [place] P, DO  Active   glucose blood (ACCU-CHEK GUIDE TEST) test strip [id number]  Use as instructed Vicci Bouchard P, DO  Active   insulin  glargine (LANTUS  SOLOSTAR) 100 UNIT/ML Solostar Pen [id number]  INJECT SUBCUTANEOUSLY 30 UNITS  AT BEDTIME Vicci, Megan P, DO  Active   insulin  regular (HUMULIN R ) 100 units/mL injection [id number]  INJECT SUBCUTANEOUSLY 10 TO 15  UNITS 3 TIMES DAILY BEFORE MEALS Johnson, Megan P, DO  Active   metoprolol  succinate (TOPROL -XL) 25 MG 24 hr tablet [id number]  TAKE 1 TABLET BY MOUTH ONCE  DAILY Johnson, Megan P, DO  Active   Multiple Vitamin (MULTIVITAMIN WITH MINERALS) TABS tablet [id number]  Take 1 tablet by mouth daily. [provider]  Active   nicotine  (NICODERM CQ  - DOSED IN MG/24 HR) 7 mg/24hr patch [id number]  Place 1 patch (7 mg total) onto the skin daily. Donette Ellouise LABOR, [place]  Active   NON FORMULARY [id number]   Vicci Bouchard P, DO  Active   nortriptyline  (PAMELOR ) 50 MG capsule [id number]  TAKE 1 CAPSULE BY MOUTH ONCE  DAILY Johnson, Megan P, DO  Active   omeprazole  (PRILOSEC) 20 MG capsule [id number]  Take 1 capsule (20 mg total) by mouth daily. Vicci Bouchard  P, DO  Active   OneTouch UltraSoft 2 Lancets MISC [id number]  USE AS INSTRUCTED TO TEST BLOOD  SUGAR DAILY AT 2 PM Johnson, Megan P, DO  Active   sacubitril -valsartan  (ENTRESTO ) 24-26 MG [id number]  Take 1 tablet by mouth 2 (two) times daily. Donette Ellouise LABOR, FNP  Active   Secukinumab (COSENTYX [place]) [id number]  Inject into the skin every 30 (thirty) days. Last date give was the week of 11/25/22. Pt unsure of exact date. [provider]  Active            Med Note ZENA, CHERYL A   Wed Sep 24, 2023  8:40 AM)  PAP  Semaglutide , 2 MG/DOSE, 8 MG/3ML SOPN [id number]  Inject 2 mg as directed once a week.  Patient not taking: Reported on 12/08/2024   Vicci Duwaine SQUIBB, DO  Active            Med Note ZENA, CHERYL A   Tue Apr 13, 2024 10:29 AM) Novo  spironolactone  (ALDACTONE ) 25 MG tablet [id number]  Take 1 tablet (25 mg total) by mouth daily. Johnson, Megan P, DO  Active   tirzepatide  (MOUNJARO ) 7.5 MG/0.5ML Pen [id number]  Inject 7.5 mg into the skin once a week. Johnson, Megan P, DO  Active   triamcinolone  ointment (KENALOG ) 0.5 % [id number]  Apply 1 Application topically daily as needed. [provider]  Active   vitamin B-12 (CYANOCOBALAMIN) 1000 MCG tablet [id number]  Take 1,000 mcg by mouth daily. [provider]  Active             Recommendation:   PCP follow up 01/20/25 Specialty provider follow-up :Harl Mealing, Pharmacist 02/22/25  Follow Up Plan:   Telephone follow-up two weeks.  Santana Stamp BSN, CCM [place]  [facility] Population Health RN Care Manager Direct Dial: [phone]  Fax: [phone]

## 2025-01-05 NOTE — Patient Instructions (Signed)
 Visit Information  Thank you for taking time to visit with me today. Please don't hesitate to contact me if I can be of assistance to you before our next scheduled appointment.  Your next care management appointment is by telephone on Wednesday, January 28th at 11:00am,.   Please call the care guide team at [phone] if you need to cancel, schedule, or reschedule an appointment.   Please call the [place]  National Suicide Prevention Lifeline: 406-696-5857 or TTY: 251-487-1834 TTY [phone]) to talk to a trained counselor if you are experiencing a Mental Health or Behavioral Health Crisis or need someone to talk to.  Santana Stamp BSN, CCM [place]  VBCI Population Health RN Care Manager Direct Dial: [phone]  Fax: [phone]

## 2025-01-06 ENCOUNTER — Other Ambulatory Visit: Payer: Self-pay | Admitting: Family Medicine

## 2025-01-07 NOTE — Telephone Encounter (Signed)
 [facility] Refill Encounter  Last office visit: 12/08/24  Verified last visit within 6 months and next visit is scheduled: Yes   Next office visit: 01/20/2025   Patient record reviewed by CMA and medication due for refill: Yes  Requested Prescriptions   Pending Prescriptions Disp Refills   gabapentin  (NEURONTIN ) 300 MG capsule [Pharmacy Med Name: GABAPENTIN  CAP 300MG  (NEUR)] 270 capsule 3    Sig: TAKE 1 CAPSULE BY MOUTH IN THE  MORNING AND 2 CAPSULES BY MOUTH  IN THE EVENING

## 2025-01-19 ENCOUNTER — Telehealth: Payer: Self-pay

## 2025-01-19 ENCOUNTER — Other Ambulatory Visit: Payer: Self-pay | Admitting: Family Medicine

## 2025-01-20 ENCOUNTER — Encounter: Payer: Self-pay | Admitting: Family Medicine

## 2025-01-20 ENCOUNTER — Ambulatory Visit: Admitting: Family Medicine

## 2025-01-20 VITALS — BP 121/69 | HR 50 | Temp 97.4°F | Resp 17 | Ht 60.0 in | Wt 198.8 lb

## 2025-01-20 DIAGNOSIS — E559 Vitamin D deficiency, unspecified: Secondary | ICD-10-CM | POA: Diagnosis not present

## 2025-01-20 DIAGNOSIS — M609 Myositis, unspecified: Secondary | ICD-10-CM | POA: Diagnosis not present

## 2025-01-20 DIAGNOSIS — Z23 Encounter for immunization: Secondary | ICD-10-CM | POA: Diagnosis not present

## 2025-01-20 DIAGNOSIS — T466X5A Adverse effect of antihyperlipidemic and antiarteriosclerotic drugs, initial encounter: Secondary | ICD-10-CM

## 2025-01-20 DIAGNOSIS — E1159 Type 2 diabetes mellitus with other circulatory complications: Secondary | ICD-10-CM

## 2025-01-20 DIAGNOSIS — R7989 Other specified abnormal findings of blood chemistry: Secondary | ICD-10-CM | POA: Diagnosis not present

## 2025-01-20 DIAGNOSIS — E782 Mixed hyperlipidemia: Secondary | ICD-10-CM | POA: Diagnosis not present

## 2025-01-20 MED ORDER — TIRZEPATIDE 5 MG/0.5ML ~~LOC~~ SOAJ: 5.0000 mg | SUBCUTANEOUS | 0 refills | Status: AC

## 2025-01-20 MED ORDER — GABAPENTIN 300 MG PO CAPS: ORAL_CAPSULE | ORAL | 0 refills | Status: AC

## 2025-01-20 MED ORDER — TIRZEPATIDE 7.5 MG/0.5ML ~~LOC~~ SOAJ: 7.5000 mg | SUBCUTANEOUS | 0 refills | Status: AC

## 2025-01-20 MED ORDER — DULOXETINE HCL 30 MG PO CPEP: 30.0000 mg | ORAL_CAPSULE | Freq: Every day | ORAL | 1 refills | Status: AC

## 2025-01-20 MED ORDER — TIRZEPATIDE 2.5 MG/0.5ML ~~LOC~~ SOAJ: 2.5000 mg | SUBCUTANEOUS | Status: AC

## 2025-01-20 NOTE — Assessment & Plan Note (Signed)
 Known CHF. She has been off GLP-1 for 6-8 weeks at this time. Did not pick up her mounjaro . Will restart mounjaro  at 2.5mg  and titrate back up to 7.5mg . Sample given today. Continue to monitor closely. Recheck 6 weeks.

## 2025-01-20 NOTE — Assessment & Plan Note (Signed)
 Did not pick up her repatha . Rx waiting at the pharmacy. Will go get it. Follow up 6 weeks.

## 2025-01-20 NOTE — Progress Notes (Signed)
 "  BP 121/69 (BP Location: Left Arm, Patient Position: Sitting, Cuff Size: Normal)   Pulse (!) 50   Temp (!) 97.4 F (36.3 C) (Oral)   Resp 17   Ht 5' (1.524 m)   Wt 198 lb 12.8 oz (90.2 kg)   PF 91 L/min   BMI 38.83 kg/m    Subjective:    Patient ID: Kelsey Mcdowell, female    DOB: 03-Oct-1946, 79 y.o.   MRN: [id number]  HPI: Kelsey Mcdowell is a 79 y.o. female  Chief Complaint  Patient presents with   Medication Problem    Has not heard anything about mounjaro . On entresto  insurance is going to stop paying for it. Has not been sent Farxiga  in a while.   Diabetes   DIABETES- here today for follow up on starting mounjaro . Has not started her mounjaro . Has not started her repatha .  Hypoglycemic episodes:no Polydipsia/polyuria: no Visual disturbance: no Chest pain: no Paresthesias: no Glucose Monitoring: yes  Accucheck frequency: TID Taking Insulin ?: yes Blood Pressure Monitoring: not checking Retinal Examination: Up to Date Foot Exam: Up to Date Diabetic Education: Completed Pneumovax: Up to Date Influenza: Up to Date Aspirin: no   Relevant past medical, surgical, family and social history reviewed and updated as indicated. Interim medical history since our last visit reviewed. Allergies and medications reviewed and updated.  Review of Systems  Constitutional: Negative.   Respiratory: Negative.    Cardiovascular: Negative.   Musculoskeletal: Negative.   Neurological: Negative.   Psychiatric/Behavioral: Negative.      Per HPI unless specifically indicated above     Objective:    BP 121/69 (BP Location: Left Arm, Patient Position: Sitting, Cuff Size: Normal)   Pulse (!) 50   Temp (!) 97.4 F (36.3 C) (Oral)   Resp 17   Ht 5' (1.524 m)   Wt 198 lb 12.8 oz (90.2 kg)   PF 91 L/min   BMI 38.83 kg/m   Wt Readings from Last 3 Encounters:  01/20/25 198 lb 12.8 oz (90.2 kg)  12/20/24 190 lb (86.2 kg)  12/08/24 190 lb 6.4 oz (86.4 kg)     Physical Exam Vitals and nursing note reviewed.  Constitutional:      General: She is not in acute distress.    Appearance: Normal appearance. She is obese. She is not ill-appearing, toxic-appearing or diaphoretic.  HENT:     Head: Normocephalic and atraumatic.     Right Ear: External ear normal.     Left Ear: External ear normal.     Nose: Nose normal.     Mouth/Throat:     Mouth: Mucous membranes are moist.     Pharynx: Oropharynx is clear.  Eyes:     General: No scleral icterus.       Right eye: No discharge.        Left eye: No discharge.     Extraocular Movements: Extraocular movements intact.     Conjunctiva/sclera: Conjunctivae normal.     Pupils: Pupils are equal, round, and reactive to light.  Cardiovascular:     Rate and Rhythm: Normal rate and regular rhythm.     Pulses: Normal pulses.     Heart sounds: Normal heart sounds. No murmur heard.    No friction rub. No gallop.  Pulmonary:     Effort: Pulmonary effort is normal. No respiratory distress.     Breath sounds: Normal breath sounds. No stridor. No wheezing, rhonchi or rales.  Chest:  Chest wall: No tenderness.  Musculoskeletal:        General: Normal range of motion.     Cervical back: Normal range of motion and neck supple.  Skin:    General: Skin is warm and dry.     Capillary Refill: Capillary refill takes less than 2 seconds.     Coloration: Skin is not jaundiced or pale.     Findings: No bruising, erythema, lesion or rash.  Neurological:     General: No focal deficit present.     Mental Status: She is alert and oriented to person, place, and time. Mental status is at baseline.  Psychiatric:        Mood and Affect: Mood normal.        Behavior: Behavior normal.        Thought Content: Thought content normal.        Judgment: Judgment normal.     Results for orders placed or performed in visit on 12/15/24  OPHTHALMOLOGY REPORT-SCANNED   Collection Time: 09/28/24 10:17 AM  Result Value Ref  Range   HM Diabetic Eye Exam    OPHTHALMOLOGY REPORT-SCANNED   Collection Time: 11/02/24 10:14 AM  Result Value Ref Range   HM Diabetic Eye Exam No Retinopathy No Retinopathy   A Comment    OPHTHALMOLOGY REPORT-SCANNED   Collection Time: 12/07/24 10:11 AM  Result Value Ref Range   HM Diabetic Eye Exam        Assessment & Plan:   Problem List Items Addressed This Visit       Cardiovascular and Mediastinum   Type 2 diabetes mellitus with cardiac complication (HCC) - Primary   Known CHF. She has been off GLP-1 for 6-8 weeks at this time. Did not pick up her mounjaro . Will restart mounjaro  at 2.5mg  and titrate back up to 7.5mg . Sample given today. Continue to monitor closely. Recheck 6 weeks.       Relevant Medications   tirzepatide  (MOUNJARO ) 2.5 MG/0.5ML Pen   tirzepatide  (MOUNJARO ) 5 MG/0.5ML Pen (Start on 02/10/2025)   tirzepatide  (MOUNJARO ) 7.5 MG/0.5ML Pen (Start on 03/10/2025)   Other Relevant Orders   CBC with Differential/Platelet   Basic metabolic panel with GFR     Musculoskeletal and Integument   Statin-induced myositis   Unable to tolerate statins. Continue zetia . Start repatha . Recheck 6 weeks.         Other   Mixed hyperlipidemia   Did not pick up her repatha . Rx waiting at the pharmacy. Will go get it. Follow up 6 weeks.       Other Visit Diagnoses       Vitamin D  deficiency       Rechecking labs today. Await results. Treat as needed.   Relevant Orders   VITAMIN D  25 Hydroxy (Vit-D Deficiency, Fractures)     Abnormal thyroid  blood test       Rechecking labs today. Await results. Treat as needed.   Relevant Orders   TSH        Follow up plan: Return in about 6 weeks (around 03/03/2025).  35 minutes spent with patient today    "

## 2025-01-20 NOTE — Telephone Encounter (Signed)
 Requested medications are due for refill today.  yes  Requested medications are on the active medications list.  yes  Last refill. 01/09/2025 #30 0 rf  Future visit scheduled.   yes  Notes to clinic.  Labs were drawn today. Please review for refill.    Requested Prescriptions  Pending Prescriptions Disp Refills   spironolactone  (ALDACTONE ) 25 MG tablet [Pharmacy Med Name: Spironolactone  25 MG Oral Tablet] 30 tablet 11    Sig: TAKE 1 TABLET BY MOUTH ONCE  DAILY     Cardiovascular: Diuretics - Aldosterone Antagonist Failed - 01/20/2025  3:25 PM      Failed - Cr in normal range and within 180 days    Creatinine, Ser  Date Value Ref Range Status  12/08/2024 2.16 (H) 0.57 - 1.00 mg/dL Final   Creatinine, Urine  Date Value Ref Range Status  06/04/2023 43  Final         Failed - eGFR is 30 or above and within 180 days    GFR calc Af Amer  Date Value Ref Range Status  07/07/2017 52 (L) >60 mL/min Final    Comment:    (NOTE) The eGFR has been calculated using the CKD EPI equation. This calculation has not been validated in all clinical situations. eGFR's persistently <60 mL/min signify possible Chronic Kidney Disease.    GFR, Estimated  Date Value Ref Range Status  10/28/2024 22 (L) >60 mL/min Final    Comment:    (NOTE) Calculated using the CKD-EPI Creatinine Equation (2021)    eGFR  Date Value Ref Range Status  12/08/2024 23 (L) >59 mL/min/1.73 Final         Passed - K in normal range and within 180 days    Potassium  Date Value Ref Range Status  12/08/2024 4.6 3.5 - 5.2 mmol/L Final         Passed - Na in normal range and within 180 days    Sodium  Date Value Ref Range Status  12/08/2024 142 134 - 144 mmol/L Final         Passed - Last BP in normal range    BP Readings from Last 1 Encounters:  01/20/25 121/69         Passed - Valid encounter within last 6 months    Recent Outpatient Visits           Today Type 2 diabetes mellitus with cardiac  complication [facility])   [place] [facility] [place], Megan P, DO   1 month ago Iron  deficiency anemia, unspecified iron  deficiency anemia type   [place] [facility] [place], Megan P, DO   5 months ago COVID-19   [place] [facility] [place], Darice, NP   7 months ago Type 2 diabetes mellitus with cardiac complication [facility])   [place] [facility] [place], Megan P, DO   7 months ago Type 2 diabetes mellitus with cardiac complication [facility])   [place] [facility] [place], Megan P, DO

## 2025-01-20 NOTE — Assessment & Plan Note (Signed)
 Unable to tolerate statins. Continue zetia . Start repatha . Recheck 6 weeks.

## 2025-01-21 ENCOUNTER — Ambulatory Visit: Payer: Self-pay | Admitting: Family Medicine

## 2025-01-21 DIAGNOSIS — E039 Hypothyroidism, unspecified: Secondary | ICD-10-CM | POA: Insufficient documentation

## 2025-01-21 LAB — CBC WITH DIFFERENTIAL/PLATELET
Basophils Absolute: 0.1 10*3/uL (ref 0.0–0.2)
Basos: 1 %
EOS (ABSOLUTE): 0.1 10*3/uL (ref 0.0–0.4)
Eos: 2 %
Hematocrit: 45.7 % (ref 34.0–46.6)
Hemoglobin: 14.9 g/dL (ref 11.1–15.9)
Immature Grans (Abs): 0 10*3/uL (ref 0.0–0.1)
Immature Granulocytes: 0 %
Lymphocytes Absolute: 2.3 10*3/uL (ref 0.7–3.1)
Lymphs: 36 %
MCH: 32.6 pg (ref 26.6–33.0)
MCHC: 32.6 g/dL (ref 31.5–35.7)
MCV: 100 fL — ABNORMAL HIGH (ref 79–97)
Monocytes Absolute: 0.4 10*3/uL (ref 0.1–0.9)
Monocytes: 6 %
Neutrophils Absolute: 3.7 10*3/uL (ref 1.4–7.0)
Neutrophils: 55 %
Platelets: 213 10*3/uL (ref 150–450)
RBC: 4.57 x10E6/uL (ref 3.77–5.28)
RDW: 13.1 % (ref 11.7–15.4)
WBC: 6.6 10*3/uL (ref 3.4–10.8)

## 2025-01-21 LAB — BASIC METABOLIC PANEL WITH GFR
BUN/Creatinine Ratio: 12 (ref 12–28)
BUN: 22 mg/dL (ref 8–27)
CO2: 23 mmol/L (ref 20–29)
Calcium: 9.5 mg/dL (ref 8.7–10.3)
Chloride: 99 mmol/L (ref 96–106)
Creatinine, Ser: 1.86 mg/dL — ABNORMAL HIGH (ref 0.57–1.00)
Glucose: 177 mg/dL — ABNORMAL HIGH (ref 70–99)
Potassium: 4.3 mmol/L (ref 3.5–5.2)
Sodium: 138 mmol/L (ref 134–144)
eGFR: 27 mL/min/{1.73_m2} — ABNORMAL LOW

## 2025-01-21 LAB — VITAMIN D 25 HYDROXY (VIT D DEFICIENCY, FRACTURES): Vit D, 25-Hydroxy: 22.8 ng/mL — ABNORMAL LOW (ref 30.0–100.0)

## 2025-01-21 LAB — TSH: TSH: 8.24 u[IU]/mL — ABNORMAL HIGH (ref 0.450–4.500)

## 2025-01-21 MED ORDER — LEVOTHYROXINE SODIUM 25 MCG PO TABS: 25.0000 ug | ORAL_TABLET | Freq: Every day | ORAL | 1 refills | Status: AC

## 2025-01-25 ENCOUNTER — Encounter (INDEPENDENT_AMBULATORY_CARE_PROVIDER_SITE_OTHER): Admitting: Nurse Practitioner

## 2025-01-27 ENCOUNTER — Other Ambulatory Visit: Payer: Self-pay | Admitting: Family Medicine

## 2025-01-28 NOTE — Progress Notes (Signed)
 Called patient and left a message to call back to get scheduled.

## 2025-01-28 NOTE — Telephone Encounter (Signed)
 Requested Prescriptions  Pending Prescriptions Disp Refills   LANTUS  SOLOSTAR 100 UNIT/ML Solostar Pen [Pharmacy Med Name: Lantus  SoloStar 100 UNIT/ML Subcutaneous Solution Pen-injector] 15 mL 0    Sig: INJECT SUBCUTANEOUSLY 30 UNITS  AT BEDTIME     Endocrinology:  Diabetes - Insulins Passed - 01/28/2025  4:10 PM      Passed - HBA1C is between 0 and 7.9 and within 180 days    Hemoglobin A1C  Date Value Ref Range Status  04/24/2023 7.9  Final   HB A1C (BAYER DCA - WAIVED)  Date Value Ref Range Status  12/08/2024 7.8 (H) 4.8 - 5.6 % Final    Comment:             Prediabetes: 5.7 - 6.4          Diabetes: >6.4          Glycemic control for adults with diabetes: <7.0          Passed - Valid encounter within last 6 months    Recent Outpatient Visits           1 week ago Type 2 diabetes mellitus with cardiac complication (HCC)   [place] [facility] [place], Megan P, DO   1 month ago Iron  deficiency anemia, unspecified iron  deficiency anemia type   [place] [facility] [place], Megan P, DO   5 months ago COVID-19   [place] [facility] [place], Darice, NP   7 months ago Type 2 diabetes mellitus with cardiac complication [facility])   [place] [facility] [place], Megan P, DO   7 months ago Type 2 diabetes mellitus with cardiac complication [place] [facility]   [place] [facility] [place], Megan P, DO

## 2025-01-31 ENCOUNTER — Encounter (INDEPENDENT_AMBULATORY_CARE_PROVIDER_SITE_OTHER): Admitting: Nurse Practitioner

## 2025-02-22 ENCOUNTER — Other Ambulatory Visit

## 2025-02-24 ENCOUNTER — Encounter: Admitting: Internal Medicine

## 2025-03-03 ENCOUNTER — Ambulatory Visit: Admitting: Family Medicine

## 2025-03-09 ENCOUNTER — Ambulatory Visit: Admitting: Family

## 2025-06-21 ENCOUNTER — Ambulatory Visit
